# Patient Record
Sex: Male | Born: 1935
Health system: Southern US, Community
[De-identification: ages and names within clinical notes are randomized; demographics above are authoritative.]

## PROBLEM LIST (undated history)

## (undated) DIAGNOSIS — N400 Enlarged prostate without lower urinary tract symptoms: Secondary | ICD-10-CM

## (undated) DIAGNOSIS — M75102 Unspecified rotator cuff tear or rupture of left shoulder, not specified as traumatic: Secondary | ICD-10-CM

## (undated) DIAGNOSIS — M1990 Unspecified osteoarthritis, unspecified site: Secondary | ICD-10-CM

## (undated) DIAGNOSIS — R972 Elevated prostate specific antigen [PSA]: Secondary | ICD-10-CM

## (undated) DIAGNOSIS — E785 Hyperlipidemia, unspecified: Secondary | ICD-10-CM

## (undated) DIAGNOSIS — N529 Male erectile dysfunction, unspecified: Secondary | ICD-10-CM

## (undated) DIAGNOSIS — R351 Nocturia: Secondary | ICD-10-CM

## (undated) DIAGNOSIS — K4091 Unilateral inguinal hernia, without obstruction or gangrene, recurrent: Secondary | ICD-10-CM

## (undated) HISTORY — PX: KNEE ARTHROSCOPY: SUR90

## (undated) HISTORY — DX: Unilateral inguinal hernia, without obstruction or gangrene, recurrent: K40.91

## (undated) HISTORY — DX: Elevated prostate specific antigen (PSA): R97.20

## (undated) HISTORY — DX: Male erectile dysfunction, unspecified: N52.9

## (undated) HISTORY — PX: EYE SURGERY: SHX253

## (undated) HISTORY — DX: Hyperlipidemia, unspecified: E78.5

## (undated) HISTORY — DX: Benign prostatic hyperplasia without lower urinary tract symptoms: N40.0

## (undated) HISTORY — DX: Gilbert syndrome: E80.4

## (undated) HISTORY — PX: JOINT REPLACEMENT: SHX530

## (undated) HISTORY — PX: TONSILLECTOMY: SUR1361

---

## 1985-04-25 HISTORY — PX: OTHER SURGICAL HISTORY: SHX169

## 1997-11-06 ENCOUNTER — Other Ambulatory Visit: Admission: RE | Admit: 1997-11-06 | Discharge: 1997-11-06 | Payer: Self-pay | Admitting: Urology

## 1999-04-08 ENCOUNTER — Other Ambulatory Visit: Admission: RE | Admit: 1999-04-08 | Discharge: 1999-04-08 | Payer: Self-pay | Admitting: Urology

## 2001-12-27 ENCOUNTER — Ambulatory Visit (HOSPITAL_BASED_OUTPATIENT_CLINIC_OR_DEPARTMENT_OTHER): Admission: RE | Admit: 2001-12-27 | Discharge: 2001-12-27 | Payer: Self-pay | Admitting: Urology

## 2001-12-27 ENCOUNTER — Encounter (INDEPENDENT_AMBULATORY_CARE_PROVIDER_SITE_OTHER): Payer: Self-pay | Admitting: Specialist

## 2004-07-13 ENCOUNTER — Ambulatory Visit (HOSPITAL_COMMUNITY): Admission: RE | Admit: 2004-07-13 | Discharge: 2004-07-13 | Payer: Self-pay | Admitting: Gastroenterology

## 2005-01-06 ENCOUNTER — Other Ambulatory Visit: Admission: RE | Admit: 2005-01-06 | Discharge: 2005-01-06 | Payer: Self-pay | Admitting: Dermatology

## 2005-03-03 ENCOUNTER — Other Ambulatory Visit: Admission: RE | Admit: 2005-03-03 | Discharge: 2005-03-03 | Payer: Self-pay | Admitting: Dermatology

## 2006-07-06 ENCOUNTER — Ambulatory Visit (HOSPITAL_BASED_OUTPATIENT_CLINIC_OR_DEPARTMENT_OTHER): Admission: RE | Admit: 2006-07-06 | Discharge: 2006-07-06 | Payer: Self-pay | Admitting: Orthopedic Surgery

## 2009-02-17 ENCOUNTER — Encounter: Admission: RE | Admit: 2009-02-17 | Discharge: 2009-02-17 | Payer: Self-pay | Admitting: Family Medicine

## 2009-12-24 LAB — FECAL OCCULT BLOOD, GUAIAC: Fecal Occult Blood: NEGATIVE

## 2010-07-02 ENCOUNTER — Other Ambulatory Visit: Payer: Self-pay | Admitting: Family Medicine

## 2010-07-02 DIAGNOSIS — R7989 Other specified abnormal findings of blood chemistry: Secondary | ICD-10-CM

## 2010-07-06 ENCOUNTER — Other Ambulatory Visit: Payer: Self-pay | Admitting: Family Medicine

## 2010-07-06 ENCOUNTER — Encounter (HOSPITAL_COMMUNITY): Payer: Self-pay

## 2010-07-06 ENCOUNTER — Ambulatory Visit (HOSPITAL_COMMUNITY)
Admission: RE | Admit: 2010-07-06 | Discharge: 2010-07-06 | Disposition: A | Discharge status: Home / Self Care | Payer: Medicare Other | Source: Ambulatory Visit | Attending: Family Medicine | Admitting: Family Medicine

## 2010-07-06 DIAGNOSIS — R7402 Elevation of levels of lactic acid dehydrogenase (LDH): Secondary | ICD-10-CM | POA: Insufficient documentation

## 2010-07-06 DIAGNOSIS — R7989 Other specified abnormal findings of blood chemistry: Secondary | ICD-10-CM

## 2010-07-06 DIAGNOSIS — N281 Cyst of kidney, acquired: Secondary | ICD-10-CM | POA: Insufficient documentation

## 2010-07-06 DIAGNOSIS — R7401 Elevation of levels of liver transaminase levels: Secondary | ICD-10-CM | POA: Insufficient documentation

## 2010-07-06 MED ORDER — IOHEXOL 300 MG/ML  SOLN
100.0000 mL | Freq: Once | INTRAMUSCULAR | Status: AC | PRN
  Administered 2010-07-06: 100 mL via INTRAVENOUS

## 2010-07-16 ENCOUNTER — Encounter: Payer: Self-pay | Admitting: Family Medicine

## 2010-07-16 DIAGNOSIS — M159 Polyosteoarthritis, unspecified: Secondary | ICD-10-CM | POA: Insufficient documentation

## 2010-07-16 DIAGNOSIS — N529 Male erectile dysfunction, unspecified: Secondary | ICD-10-CM | POA: Insufficient documentation

## 2010-07-16 DIAGNOSIS — E785 Hyperlipidemia, unspecified: Secondary | ICD-10-CM | POA: Insufficient documentation

## 2010-07-16 DIAGNOSIS — R972 Elevated prostate specific antigen [PSA]: Secondary | ICD-10-CM | POA: Insufficient documentation

## 2010-07-16 DIAGNOSIS — K4091 Unilateral inguinal hernia, without obstruction or gangrene, recurrent: Secondary | ICD-10-CM | POA: Insufficient documentation

## 2010-08-30 ENCOUNTER — Encounter: Payer: Self-pay | Admitting: Family Medicine

## 2010-09-10 NOTE — Op Note (Signed)
NAMESHAWNMICHAEL, PARENTEAU                 ACCOUNT NO.:  [id number]   MEDICAL RECORD NO.:  [id number]          PATIENT TYPE:  AMB   LOCATION:  DSC                          FACILITY:  [facility]   PHYSICIAN:  Katy Fitch. Sypher, M.D. 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   OPERATIONS:  1. Debridement of right small finger distal interphalangeal joint.  2. Removal of marginal osteophytes from base of distal phalanx and the      dorsal ulnar aspect of middle phalangeal head.  3. Debridement of mucoid cyst of dorsal nail fold.   OPERATIONS:  Katy Fitch. Sypher, M.D.   ASSISTANT:  No assistant.   ANESTHESIA:  Lidocaine 2% metacarpal head-level block, right small  finger -- supplemented by IV sedation.   SUPERVISING ANESTHESIOLOGIST:  Janetta Hora. Gelene Mink, M.D.   INDICATIONS:  The patient is a 77 year old gentleman who is well  acquainted with our practice, due to prior care for a traumatic tendon  injury.  He is referred by Dr. Christell Constant of [place] for  evaluation and management of a chronic right small finger nail  deformity.   He developed a mucoid cyst more than a year ago and saw Dr. Margo Aye his  dermatologist.  Dr. Margo Aye performed local debridement of the cyst without  success.  He subsequently suggested that the patient follow up with a  hand surgeon for DIP joint debridement.   On exam the patient had a full-length nail groove that was  approximately  40% of the dorsal ulnar nail fold.  He had a mucoid cyst presenting at  the dorsal nail fold and significant Heberdine Bouchard node formation,  consistent with advanced osteoarthritis.   Plain x-rays of his finger documented marginal osteophyte formation  without obvious loose bodies.   The patient requested that we intervene to try to remedy his cyst.   Preoperatively he was advised that due to the prior debridement of the  dorsal nail fold, we could not guarantee that his nail plate would  return to normal.   It is our experienced that after there has been local manipulation by  other physicians of the dermis and subcutaneous tissues in the region of  the nail fold, that often the germinal nail matrix has chronic scarring.  We will proceed with a best effort at this time to resolve this  predicament.   The patient understands we could not affect his underlying  osteoarthritis.   PROCEDURE:  The patient  was brought to the operating room and placed in  supine position on the operating room table.  Following the induction of light sedation, the right hand was prepped  with Betadine and a 2% lidocaine metacarpal head-level block was placed;  with a total of 4 mL of 2% lidocaine.  The right arm was then prepped  with Betadine soapy solution and sterilely draped.  The right small  finger was exsanguinated with gauze wrap, and 1/4-inch Penrose drain was  placed at the base of the finger as a digital tourniquet.   When anesthesia was assured, the procedure commenced with a hockey-stick  type incision extending over the dorsal ulnar aspect of the DIP joint.  This was carried across the dorsal nail fold, unroofing the cyst.  The  cyst was circumferentially dissected, followed back to a sinus tract  exiting from the dorsal ulnar aspect of the joint.  All of the mucinous  contents were debrided, followed by careful use of a curet underneath  the skin to remove any  remaining mucin and seek additional sites of  leakage.  The joint was manipulated in flexion and extension, and a leak  from the dorsal ulnar aspect of the joint confirmed.  There was a large  osteophyte poking through the capsule on the dorsal ulnar aspect of the  joint.  The joint was opened with a capsulotomy between the ulnar  collateral ligament and the distal extensor tendon slip.  A fine rongeur  and a microcuret was used to debride the marginal osteophytes on the  base of the proximal phalanx, deep to the extensor tendon and on the  ulnar corner of the middle phalangeal head.   The joint was then meticulously irrigated with a dental needle and 40 mL  of sterile saline.  The wound was then repaired with simple suture of 5-  0 nylon.  A compressive dressing was applied with Xeroflo sterile gauze  and Coban.  For aftercare the patient was advised to elevate his hand  above his heart for 24 hours.  He will avoid getting his bandage wet for  1 week, until he returns to the office for dressing change and possible  suture removal.   He is provided prescriptions for doxycycline 100 mg p.o. b.i.d. x5 days,  and Percocet 5 mg one p.o. q.4-6 h p.r.n. pain (20 tablets without  refill).      Katy Fitch Sypher, M.D.  Electronically Signed     RVS/MEDQ  D:  07/06/2006  T:  07/07/2006  Job:  [id number]   cc:   Ernestina Penna, M.D.

## 2010-09-10 NOTE — Op Note (Signed)
Cameron [name], Cameron [name]                 ACCOUNT NO.:  [id number]   MEDICAL RECORD NO.:  [id number]          PATIENT TYPE:  AMB   LOCATION:  ENDO                         FACILITY:  [facility]   PHYSICIAN:  John C. Madilyn Fireman, M.D.    DATE OF BIRTH:  1933-03-21   DATE OF PROCEDURE:  07/13/2004  DATE OF DISCHARGE:                                 OPERATIVE REPORT   PROCEDURE:  Colonoscopy.   INDICATIONS FOR PROCEDURE:  Average risk colon cancer screening.   DESCRIPTION OF PROCEDURE:  The patient was placed in the left lateral  decubitus position then placed on the pulse monitor with continuous low flow  oxygen delivered by nasal cannula. He was sedated with 50 mcg IV fentanyl  and 5 mg IV Versed. The Olympus video colonoscope was inserted into the  rectum and advanced to the cecum, confirmed by transillumination at  McBurney's point and visualization of the ileocecal valve and appendiceal  orifice. The prep was excellent. The cecum, ascending, transverse,  descending and sigmoid colon all appeared normal with no masses, polyps,  diverticula or other mucosal abnormalities. The rectum likewise appeared  normal and retroflexed view of the anus revealed no obvious internal  hemorrhoids. The scope was then withdrawn and the patient returned to the  recovery room in stable condition. He tolerated the procedure well and there  were no immediate complications.   IMPRESSION:  Normal colonoscopy.   PLAN:  Next colonoscopy within 10 years and consider flexible sigmoidoscopy  or Hemoccult's in five years.      JCH/MEDQ  D:  07/13/2004  T:  07/13/2004  Job:  [id number]   cc:   Ernestina Penna, M.D.  [address] [place]  [place] [address]  Fax: [phone]

## 2010-09-10 NOTE — Op Note (Signed)
   Cameron [name], LUCCHESI                    ACCOUNT NO.:  [id number]   MEDICAL RECORD NO.:  [id number]                   PATIENT TYPE:  AMB   LOCATION:  [facility]                                 FACILITY:  [facility]   PHYSICIAN:  Sigmund I. Patsi Sears, M.D.         DATE OF BIRTH:  03/03/1933   DATE OF PROCEDURE:  DATE OF DISCHARGE:                                 OPERATIVE REPORT   PREOPERATIVE DIAGNOSES:  Abnormal PSA with history of negative biopsies.   POSTOPERATIVE DIAGNOSES:  Abnormal PSA with history of negative biopsies.   OPERATION:  Transperineal needle biopsy of the prostate.   RADIATION THERAPIST:  Maryln Gottron, M.D.   PREPARATION:  After appropriate preanesthesia, the patient was brought to  the operating room, placed on the operating table in the dorsal supine  position where general LMA anesthesia was introduced. He was then replaced  in dorsal lithotomy position where the pubis was prepped with Betadine  solution and draped in the usual fashion.   REVIEW OF HISTORY:  This 77 year old male, has a history of 101 cc gland,  prostate __________ symptoms of 23/7, quality of life index of 3/6, on  Flomax and Proscar for the last six months. The patient has a history of  elevated PSAs in the past with multiple negative biopsies. PSAs have ranged  from 6.1 in 1999 to 7.5 in 2000. His highest PSA is 9.2. The PSA-2 ranging  from 23% to 14%. His current PSA is 3.42 (6.84 on Proscar) with a PSA-2 of  14.62%. He is now for multiple transperineal needle biopsies.   DESCRIPTION OF PROCEDURE:  The prostate is ultrasounded and multiple cords  of tissue were taken prostate mapping (see pathology notes). The patient  tolerated the procedure well, and a Foley catheter was placed at the end of  the biopsies. He was awakened and taken to the recovery room in good  condition.                                                Sigmund I. Patsi Sears, M.D.    SIT/MEDQ  D:  12/27/2001   T:  12/27/2001  Job:  [id number]

## 2011-05-05 DIAGNOSIS — R972 Elevated prostate specific antigen [PSA]: Secondary | ICD-10-CM | POA: Diagnosis not present

## 2011-05-05 DIAGNOSIS — E785 Hyperlipidemia, unspecified: Secondary | ICD-10-CM | POA: Diagnosis not present

## 2011-07-26 DIAGNOSIS — E785 Hyperlipidemia, unspecified: Secondary | ICD-10-CM | POA: Diagnosis not present

## 2011-07-26 DIAGNOSIS — I10 Essential (primary) hypertension: Secondary | ICD-10-CM | POA: Diagnosis not present

## 2011-07-26 DIAGNOSIS — E559 Vitamin D deficiency, unspecified: Secondary | ICD-10-CM | POA: Diagnosis not present

## 2011-08-30 DIAGNOSIS — M79609 Pain in unspecified limb: Secondary | ICD-10-CM | POA: Diagnosis not present

## 2011-08-30 DIAGNOSIS — N400 Enlarged prostate without lower urinary tract symptoms: Secondary | ICD-10-CM | POA: Diagnosis not present

## 2011-08-30 DIAGNOSIS — E785 Hyperlipidemia, unspecified: Secondary | ICD-10-CM | POA: Diagnosis not present

## 2011-08-31 DIAGNOSIS — Z1212 Encounter for screening for malignant neoplasm of rectum: Secondary | ICD-10-CM | POA: Diagnosis not present

## 2011-09-26 DIAGNOSIS — G5600 Carpal tunnel syndrome, unspecified upper limb: Secondary | ICD-10-CM | POA: Diagnosis not present

## 2011-09-26 DIAGNOSIS — M19049 Primary osteoarthritis, unspecified hand: Secondary | ICD-10-CM | POA: Diagnosis not present

## 2011-12-01 DIAGNOSIS — E559 Vitamin D deficiency, unspecified: Secondary | ICD-10-CM | POA: Diagnosis not present

## 2011-12-01 DIAGNOSIS — E785 Hyperlipidemia, unspecified: Secondary | ICD-10-CM | POA: Diagnosis not present

## 2011-12-01 DIAGNOSIS — I10 Essential (primary) hypertension: Secondary | ICD-10-CM | POA: Diagnosis not present

## 2011-12-01 DIAGNOSIS — R5381 Other malaise: Secondary | ICD-10-CM | POA: Diagnosis not present

## 2011-12-01 DIAGNOSIS — R5383 Other fatigue: Secondary | ICD-10-CM | POA: Diagnosis not present

## 2011-12-29 DIAGNOSIS — E785 Hyperlipidemia, unspecified: Secondary | ICD-10-CM | POA: Diagnosis not present

## 2011-12-29 DIAGNOSIS — M25569 Pain in unspecified knee: Secondary | ICD-10-CM | POA: Diagnosis not present

## 2011-12-29 DIAGNOSIS — N400 Enlarged prostate without lower urinary tract symptoms: Secondary | ICD-10-CM | POA: Diagnosis not present

## 2012-01-10 DIAGNOSIS — M1710 Unilateral primary osteoarthritis, unspecified knee: Secondary | ICD-10-CM | POA: Diagnosis not present

## 2012-01-10 DIAGNOSIS — IMO0002 Reserved for concepts with insufficient information to code with codable children: Secondary | ICD-10-CM | POA: Diagnosis not present

## 2012-01-30 DIAGNOSIS — Z23 Encounter for immunization: Secondary | ICD-10-CM | POA: Diagnosis not present

## 2012-01-31 DIAGNOSIS — IMO0002 Reserved for concepts with insufficient information to code with codable children: Secondary | ICD-10-CM | POA: Diagnosis not present

## 2012-01-31 DIAGNOSIS — M1710 Unilateral primary osteoarthritis, unspecified knee: Secondary | ICD-10-CM | POA: Diagnosis not present

## 2012-03-06 DIAGNOSIS — N138 Other obstructive and reflux uropathy: Secondary | ICD-10-CM | POA: Diagnosis not present

## 2012-03-06 DIAGNOSIS — N401 Enlarged prostate with lower urinary tract symptoms: Secondary | ICD-10-CM | POA: Diagnosis not present

## 2012-03-06 DIAGNOSIS — R972 Elevated prostate specific antigen [PSA]: Secondary | ICD-10-CM | POA: Diagnosis not present

## 2012-03-13 DIAGNOSIS — R351 Nocturia: Secondary | ICD-10-CM | POA: Diagnosis not present

## 2012-03-13 DIAGNOSIS — R972 Elevated prostate specific antigen [PSA]: Secondary | ICD-10-CM | POA: Diagnosis not present

## 2012-03-13 DIAGNOSIS — N138 Other obstructive and reflux uropathy: Secondary | ICD-10-CM | POA: Diagnosis not present

## 2012-03-13 DIAGNOSIS — N529 Male erectile dysfunction, unspecified: Secondary | ICD-10-CM | POA: Diagnosis not present

## 2012-04-03 DIAGNOSIS — E785 Hyperlipidemia, unspecified: Secondary | ICD-10-CM | POA: Diagnosis not present

## 2012-04-03 DIAGNOSIS — N400 Enlarged prostate without lower urinary tract symptoms: Secondary | ICD-10-CM | POA: Diagnosis not present

## 2012-04-04 DIAGNOSIS — R5381 Other malaise: Secondary | ICD-10-CM | POA: Diagnosis not present

## 2012-04-04 DIAGNOSIS — R5383 Other fatigue: Secondary | ICD-10-CM | POA: Diagnosis not present

## 2012-04-04 DIAGNOSIS — Z125 Encounter for screening for malignant neoplasm of prostate: Secondary | ICD-10-CM | POA: Diagnosis not present

## 2012-04-04 DIAGNOSIS — I10 Essential (primary) hypertension: Secondary | ICD-10-CM | POA: Diagnosis not present

## 2012-04-04 DIAGNOSIS — E559 Vitamin D deficiency, unspecified: Secondary | ICD-10-CM | POA: Diagnosis not present

## 2012-04-04 DIAGNOSIS — E785 Hyperlipidemia, unspecified: Secondary | ICD-10-CM | POA: Diagnosis not present

## 2012-05-04 ENCOUNTER — Encounter (HOSPITAL_COMMUNITY): Payer: Self-pay | Admitting: Pharmacy Technician

## 2012-05-08 ENCOUNTER — Encounter (HOSPITAL_COMMUNITY): Payer: Self-pay

## 2012-05-08 ENCOUNTER — Encounter (HOSPITAL_COMMUNITY)
Admission: RE | Admit: 2012-05-08 | Discharge: 2012-05-08 | Disposition: A | Discharge status: Home / Self Care | Payer: Medicare Other | Source: Ambulatory Visit | Attending: Orthopedic Surgery | Admitting: Orthopedic Surgery

## 2012-05-08 ENCOUNTER — Ambulatory Visit (HOSPITAL_COMMUNITY)
Admission: RE | Admit: 2012-05-08 | Discharge: 2012-05-08 | Disposition: A | Discharge status: Home / Self Care | Payer: Medicare Other | Source: Ambulatory Visit | Attending: Surgical | Admitting: Surgical

## 2012-05-08 ENCOUNTER — Other Ambulatory Visit (HOSPITAL_COMMUNITY): Payer: Self-pay | Admitting: *Deleted

## 2012-05-08 DIAGNOSIS — Z01812 Encounter for preprocedural laboratory examination: Secondary | ICD-10-CM | POA: Insufficient documentation

## 2012-05-08 DIAGNOSIS — Z01818 Encounter for other preprocedural examination: Secondary | ICD-10-CM | POA: Diagnosis not present

## 2012-05-08 HISTORY — DX: Unspecified osteoarthritis, unspecified site: M19.90

## 2012-05-08 LAB — URINALYSIS, ROUTINE W REFLEX MICROSCOPIC
Bilirubin Urine: NEGATIVE
Glucose, UA: NEGATIVE mg/dL
Hgb urine dipstick: NEGATIVE
Leukocytes, UA: NEGATIVE
Nitrite: NEGATIVE
Protein, ur: NEGATIVE mg/dL
Specific Gravity, Urine: 1.025 (ref 1.005–1.030)
Urobilinogen, UA: 1 mg/dL (ref 0.0–1.0)
pH: 6.5 (ref 5.0–8.0)

## 2012-05-08 LAB — CBC
HCT: 48.8 % (ref 39.0–52.0)
MCH: 31.3 pg (ref 26.0–34.0)
RBC: 5.36 MIL/uL (ref 4.22–5.81)
WBC: 7.6 10*3/uL (ref 4.0–10.5)

## 2012-05-08 LAB — COMPREHENSIVE METABOLIC PANEL
ALT: 15 U/L (ref 0–53)
AST: 15 U/L (ref 0–37)
Albumin: 4.1 g/dL (ref 3.5–5.2)
Alkaline Phosphatase: 71 U/L (ref 39–117)
BUN: 20 mg/dL (ref 6–23)
CO2: 30 mEq/L (ref 19–32)
Calcium: 10 mg/dL (ref 8.4–10.5)
Chloride: 99 mEq/L (ref 96–112)
Creatinine, Ser: 0.87 mg/dL (ref 0.50–1.35)
GFR calc Af Amer: >90 mL/min (ref >90)
GFR calc non Af Amer: 81 mL/min — ABNORMAL LOW (ref >90)
Glucose, Bld: 91 mg/dL (ref 70–99)
Potassium: 4.3 mEq/L (ref 3.5–5.1)
Sodium: 137 mEq/L (ref 135–145)
Total Bilirubin: 1.4 mg/dL — ABNORMAL HIGH (ref 0.3–1.2)
Total Protein: 7 g/dL (ref 6.0–8.3)

## 2012-05-08 LAB — PROTIME-INR
INR: 1.03 (ref 0.00–1.49)
Prothrombin Time: 13.4 seconds (ref 11.6–15.2)

## 2012-05-08 LAB — APTT: aPTT: 28 seconds (ref 24–37)

## 2012-05-08 LAB — SURGICAL PCR SCREEN: Staphylococcus aureus: NEGATIVE

## 2012-05-08 NOTE — Patient Instructions (Addendum)
20      Your procedure is scheduled on: Wednesday 05/16/2012   Report to [facility] at 0630 AM.  Call this number if you have problems the morning of surgery: [phone]   Remember:             IF YOU USE CPAP,BRING MASK AND TUBING AM OF SURGERY!   Do not eat food or drink liquids AFTER MIDNIGHT!  Take these medicines the morning of surgery with A SIP OF WATER: NONE   Do not bring valuables to the hospital.  .  Leave suitcase in the car. After surgery it may be brought to your room.  For patients admitted to the hospital, checkout time is 11:00 AM the day of              Discharge.    DO NOT WEAR JEWELRY , MAKE-UP, LOTIONS,POWDERS,PERFUMES!             WOMEN -DO NOT SHAVE LEGS OR UNDERARMS 12 HRS. BEFORE SURGERY!               MEN MAY SHAVE AS USUAL!             CONTACTS,DENTURES OR BRIDGEWORK, FALSE EYELASHES MAY NOT BE WORN INTO SURGERY!                                           Patients discharged the day of surgery will not be allowed to drive home.If going home the same day of surgery, must have someone stay with youfirst 24 hrs.at home and arrange for someone to drive you home from the              Hospital.              YOUR DRIVER ZO:[id number]   Special Instructions:             Please read over the following fact sheets that you were given:             1. [place] PREPARING FOR SURGERY SHEET              2.MRSA INFORMATION              3.[facility]                                        Telford Nab.Hermen Mario,RN,BSN     [id number]                FAILURE TO FOLLOW THESE INSTRUCTIONS MAY RESULT IN CANCELLATION OF YOUR SURGERY!               Patient Signature:___________________________

## 2012-05-08 NOTE — Progress Notes (Signed)
Surgical clearance from [facility] -Dr. Rudi Heap 04/03/2012 on chart, and Treadmill EKG study from [facility] 10/06/2010 on chart.

## 2012-05-09 NOTE — H&P (Signed)
TOTAL KNEE ADMISSION H&P  Patient is being admitted for left total knee arthroplasty.  Subjective:  Chief Complaint:left knee pain.  HPI: Cameron [name], 79 y.o. male, has a history of pain and functional disability in the left knee due to arthritis and has failed non-surgical conservative treatments for greater than 12 weeks to includeNSAID's and/or analgesics, corticosteriod injections, flexibility and strengthening excercises and activity modification.  Onset of symptoms was gradual, starting 1 years ago with gradually worsening course since that time. The patient noted prior procedures on the knee to include  arthroscopy on the left knee(s).  Patient currently rates pain in the left knee(s) at 7 out of 10 with activity. Patient has night pain, worsening of pain with activity and weight bearing, pain that interferes with activities of daily living, pain with passive range of motion, crepitus and joint swelling.  Patient has evidence of subchondral sclerosis, periarticular osteophytes and joint space narrowing by imaging studies. There is no active infection.  Patient Active Problem List   Diagnosis Date Noted  . Gilberts syndrome   . Inguinal hernia recurrent unilateral   . Elevated PSA   . Erectile dysfunction   . Other and unspecified hyperlipidemia   . Osteoarthrosis and allied disorders    Past Medical History  Diagnosis Date  . BPH (benign prostatic hyperplasia)   . Osteoarthrosis and allied disorders   . Erectile dysfunction   . Other and unspecified hyperlipidemia   . Elevated PSA   . Inguinal hernia recurrent unilateral   . Gilberts syndrome   . Arthritis     Past Surgical History  Procedure Date  . Prostate surgery 2004  . Tonsillectomy     as child  . Right hand   . Knee arthroscopy     bilateral     Current outpatient prescriptions: aspirin 81 MG EC tablet, Take 81 mg by mouth at bedtime. , Disp: , Rfl: ;   cholecalciferol (VITAMIN D) 1000 UNITS tablet, Take  1,000 Units by mouth 2 (two) times daily., Disp: , Rfl: ;   Coenzyme Q10 200 MG capsule, Take 200 mg by mouth every other day., Disp: , Rfl: ;   Flaxseed, Linseed, (FLAX SEED OIL) 1000 MG CAPS, Take 1,000 mg by mouth daily., Disp: , Rfl:  GLUCOSAMINE-CHONDROIT-MSM-C-MN PO, Take 1 tablet by mouth 2 (two) times daily., Disp: , Rfl: ibuprofen (ADVIL,MOTRIN) 200 MG tablet, Take 400 mg by mouth every 8 (eight) hours as needed. Pain, Disp: , Rfl: ;   simvastatin (ZOCOR) 40 MG tablet, Take 20-40 mg by mouth at bedtime. 1/2 alternating with 1/2 q o d, Disp: , Rfl: ;   Tamsulosin HCl (FLOMAX) 0.4 MG CAPS, Take 0.4 mg by mouth daily after supper., Disp: , Rfl:    Allergies  Allergen Reactions  . Bee Venom Swelling    History  Substance Use Topics  . Smoking status: Former Games developer  . Smokeless tobacco: Not on file  . Alcohol Use: 0.6 oz/week    1 Cans of beer per week     Comment: 1 beer daily    Family History  Problem Relation Age of Onset  . COPD Mother   . Heart disease Father   . Heart attack Father   . Cancer Maternal Grandfather   . Heart attack Paternal Grandfather   . Hypertension Sister   . Hypertension Sister      Review of Systems  Constitutional: Negative.   HENT: Negative.  Negative for neck pain.   Eyes: Negative.  Respiratory: Negative.   Cardiovascular: Negative.   Gastrointestinal: Negative.   Genitourinary: Positive for frequency. Negative for dysuria, urgency, hematuria and flank pain.  Musculoskeletal: Positive for back pain and joint pain. Negative for myalgias and falls.       Left knee pain  Skin: Negative.   Neurological: Negative.   Endo/Heme/Allergies: Negative.   Psychiatric/Behavioral: Negative.     Objective:  Physical Exam  Constitutional: He is oriented to person, place, and time. He appears well-developed and well-nourished. No distress.  HENT:  Head: Normocephalic and atraumatic.  Right Ear: External ear normal.  Left Ear: External ear  normal.  Nose: Nose normal.  Mouth/Throat: Oropharynx is clear and moist.  Eyes: Conjunctivae normal and EOM are normal.  Neck: Normal range of motion. Neck supple. No tracheal deviation present. No thyromegaly present.  Cardiovascular: Normal rate, regular rhythm, normal heart sounds and intact distal pulses.   No murmur heard. Respiratory: Effort normal and breath sounds normal. No respiratory distress. He has no wheezes. He exhibits no tenderness.  GI: Soft. Bowel sounds are normal. He exhibits no distension and no mass. There is no tenderness.  Musculoskeletal:       Right hip: Normal.       Left hip: Normal.       Right knee: Normal.       Left knee: He exhibits decreased range of motion and swelling. He exhibits no erythema.       Right lower leg: He exhibits no tenderness and no swelling.       Left lower leg: He exhibits no tenderness and no swelling.       Legs: Lymphadenopathy:    He has no cervical adenopathy.  Neurological: He is alert and oriented to person, place, and time. He has normal strength and normal reflexes. No sensory deficit.  Skin: No rash noted. He is not diaphoretic. No erythema.  Psychiatric: His behavior is normal.    Vital signs in last 24 hours: Temp:  [97.5 F (36.4 C)] 97.5 F (36.4 C) (01/14 1146) Pulse Rate:  [71] 71  (01/14 1146) Resp:  [16] 16  (01/14 1146) BP: (125)/(77) 125/77 mmHg (01/14 1146) SpO2:  [96 %] 96 % (01/14 1146) Weight:  [78.926 kg (174 lb)] 78.926 kg (174 lb) (01/14 1146)   Imaging Review Plain radiographs demonstrate moderate degenerative joint disease of the left knee(s). The overall alignment ismild varus. The bone quality appears to be fair for age and reported activity level.  Assessment/Plan:  End stage arthritis, left knee   The patient history, physical examination, clinical judgment of the provider and imaging studies are consistent with end stage degenerative joint disease of the left knee(s) and total knee  arthroplasty is deemed medically necessary. The treatment options including medical management, injection therapy arthroscopy and arthroplasty were discussed at length. The risks and benefits of total knee arthroplasty were presented and reviewed. The risks due to aseptic loosening, infection, stiffness, patella tracking problems, thromboembolic complications and other imponderables were discussed. The patient acknowledged the explanation, agreed to proceed with the plan and consent was signed. Patient is being admitted for inpatient treatment for surgery, pain control, PT, OT, prophylactic antibiotics, VTE prophylaxis, progressive ambulation and ADL's and discharge planning. The patient is planning to be discharged home with home health services     [place], [place]

## 2012-05-16 ENCOUNTER — Inpatient Hospital Stay (HOSPITAL_COMMUNITY)
Admission: RE | Admit: 2012-05-16 | Discharge: 2012-05-19 | DRG: 470 | Disposition: A | Discharge status: Home Health | Payer: Medicare Other | Source: Ambulatory Visit | Attending: Orthopedic Surgery | Admitting: Orthopedic Surgery

## 2012-05-16 ENCOUNTER — Encounter (HOSPITAL_COMMUNITY): Payer: Self-pay | Admitting: Registered Nurse

## 2012-05-16 ENCOUNTER — Encounter (HOSPITAL_COMMUNITY)
Admission: RE | Disposition: A | Discharge status: Home Health | Payer: Self-pay | Source: Ambulatory Visit | Attending: Orthopedic Surgery

## 2012-05-16 ENCOUNTER — Encounter (HOSPITAL_COMMUNITY): Payer: Self-pay | Admitting: *Deleted

## 2012-05-16 ENCOUNTER — Inpatient Hospital Stay (HOSPITAL_COMMUNITY): Payer: Medicare Other

## 2012-05-16 ENCOUNTER — Inpatient Hospital Stay (HOSPITAL_COMMUNITY): Payer: Medicare Other | Admitting: Registered Nurse

## 2012-05-16 ENCOUNTER — Encounter (HOSPITAL_COMMUNITY): Payer: Self-pay | Admitting: Orthopedic Surgery

## 2012-05-16 DIAGNOSIS — M1710 Unilateral primary osteoarthritis, unspecified knee: Principal | ICD-10-CM | POA: Diagnosis present

## 2012-05-16 DIAGNOSIS — IMO0002 Reserved for concepts with insufficient information to code with codable children: Secondary | ICD-10-CM | POA: Diagnosis not present

## 2012-05-16 DIAGNOSIS — R972 Elevated prostate specific antigen [PSA]: Secondary | ICD-10-CM | POA: Diagnosis present

## 2012-05-16 DIAGNOSIS — N400 Enlarged prostate without lower urinary tract symptoms: Secondary | ICD-10-CM | POA: Diagnosis present

## 2012-05-16 DIAGNOSIS — Z96659 Presence of unspecified artificial knee joint: Secondary | ICD-10-CM | POA: Diagnosis not present

## 2012-05-16 DIAGNOSIS — D62 Acute posthemorrhagic anemia: Secondary | ICD-10-CM | POA: Diagnosis not present

## 2012-05-16 DIAGNOSIS — E785 Hyperlipidemia, unspecified: Secondary | ICD-10-CM | POA: Diagnosis present

## 2012-05-16 DIAGNOSIS — Z79899 Other long term (current) drug therapy: Secondary | ICD-10-CM

## 2012-05-16 DIAGNOSIS — M24569 Contracture, unspecified knee: Secondary | ICD-10-CM | POA: Diagnosis present

## 2012-05-16 DIAGNOSIS — Z471 Aftercare following joint replacement surgery: Secondary | ICD-10-CM | POA: Diagnosis not present

## 2012-05-16 HISTORY — PX: TOTAL KNEE ARTHROPLASTY: SHX125

## 2012-05-16 LAB — ABO/RH: ABO/RH(D): O POS

## 2012-05-16 LAB — TYPE AND SCREEN
ABO/RH(D): O POS
Antibody Screen: NEGATIVE

## 2012-05-16 SURGERY — ARTHROPLASTY, KNEE, TOTAL
Anesthesia: General | Site: Knee | Laterality: Left | Wound class: Clean

## 2012-05-16 MED ORDER — SODIUM CHLORIDE 0.9 % IR SOLN
Status: DC | PRN
  Administered 2012-05-16: 09:00:00

## 2012-05-16 MED ORDER — ACETAMINOPHEN 325 MG PO TABS: 650.0000 mg | ORAL_TABLET | Freq: Four times a day (QID) | ORAL | Status: DC | PRN

## 2012-05-16 MED ORDER — HYDROCODONE-ACETAMINOPHEN 5-325 MG PO TABS
1.0000 | ORAL_TABLET | ORAL | Status: DC | PRN
  Administered 2012-05-16 (×2): 2 via ORAL
  Administered 2012-05-17 (×3): 1 via ORAL
  Administered 2012-05-17: 2 via ORAL
  Administered 2012-05-18 (×5): 1 via ORAL
  Administered 2012-05-19: 2 via ORAL
  Filled 2012-05-16 (×2): qty 1
  Filled 2012-05-16 (×3): qty 2
  Filled 2012-05-16 (×2): qty 1
  Filled 2012-05-16 (×2): qty 2
  Filled 2012-05-16 (×3): qty 1

## 2012-05-16 MED ORDER — HEMOSTATIC AGENTS (NO CHARGE) OPTIME
TOPICAL | Status: DC | PRN
  Administered 2012-05-16: 1 via TOPICAL

## 2012-05-16 MED ORDER — SIMVASTATIN 40 MG PO TABS
40.0000 mg | ORAL_TABLET | ORAL | Status: DC
  Filled 2012-05-16 (×2): qty 1

## 2012-05-16 MED ORDER — CEFAZOLIN SODIUM 1-5 GM-% IV SOLN
1.0000 g | Freq: Four times a day (QID) | INTRAVENOUS | Status: AC
  Administered 2012-05-16 (×2): 1 g via INTRAVENOUS
  Filled 2012-05-16 (×2): qty 50

## 2012-05-16 MED ORDER — CEFAZOLIN SODIUM-DEXTROSE 2-3 GM-% IV SOLR
2.0000 g | INTRAVENOUS | Status: AC
  Administered 2012-05-16: 2 g via INTRAVENOUS

## 2012-05-16 MED ORDER — SIMVASTATIN 20 MG PO TABS
20.0000 mg | ORAL_TABLET | Freq: Every day | ORAL | Status: DC
  Filled 2012-05-16: qty 1

## 2012-05-16 MED ORDER — PROMETHAZINE HCL 25 MG/ML IJ SOLN: 6.2500 mg | INTRAMUSCULAR | Status: DC | PRN

## 2012-05-16 MED ORDER — PROMETHAZINE HCL 25 MG/ML IJ SOLN
INTRAMUSCULAR | Status: AC
  Filled 2012-05-16: qty 1

## 2012-05-16 MED ORDER — METHOCARBAMOL 100 MG/ML IJ SOLN
500.0000 mg | Freq: Four times a day (QID) | INTRAVENOUS | Status: DC | PRN
  Administered 2012-05-16: 500 mg via INTRAVENOUS
  Filled 2012-05-16: qty 5

## 2012-05-16 MED ORDER — MENTHOL 3 MG MT LOZG: 1.0000 | LOZENGE | OROMUCOSAL | Status: DC | PRN

## 2012-05-16 MED ORDER — GLYCOPYRROLATE 0.2 MG/ML IJ SOLN
INTRAMUSCULAR | Status: DC | PRN
  Administered 2012-05-16: .6 mg via INTRAVENOUS

## 2012-05-16 MED ORDER — CELECOXIB 200 MG PO CAPS
200.0000 mg | ORAL_CAPSULE | Freq: Two times a day (BID) | ORAL | Status: DC
  Administered 2012-05-16 – 2012-05-19 (×7): 200 mg via ORAL
  Filled 2012-05-16 (×9): qty 1

## 2012-05-16 MED ORDER — ALUM & MAG HYDROXIDE-SIMETH 200-200-20 MG/5ML PO SUSP: 30.0000 mL | ORAL | Status: DC | PRN

## 2012-05-16 MED ORDER — SODIUM CHLORIDE 0.9 % IJ SOLN
INTRAMUSCULAR | Status: DC | PRN
  Administered 2012-05-16: 20 mL

## 2012-05-16 MED ORDER — HYDROMORPHONE HCL PF 1 MG/ML IJ SOLN
0.2500 mg | INTRAMUSCULAR | Status: DC | PRN
  Administered 2012-05-16 (×2): 0.5 mg via INTRAVENOUS

## 2012-05-16 MED ORDER — FENTANYL CITRATE 0.05 MG/ML IJ SOLN
INTRAMUSCULAR | Status: DC | PRN
  Administered 2012-05-16 (×5): 50 ug via INTRAVENOUS

## 2012-05-16 MED ORDER — PHENOL 1.4 % MT LIQD: 1.0000 | OROMUCOSAL | Status: DC | PRN

## 2012-05-16 MED ORDER — HYDROMORPHONE HCL PF 1 MG/ML IJ SOLN: 1.0000 mg | INTRAMUSCULAR | Status: DC | PRN

## 2012-05-16 MED ORDER — MEPERIDINE HCL 50 MG/ML IJ SOLN: 6.2500 mg | INTRAMUSCULAR | Status: DC | PRN

## 2012-05-16 MED ORDER — NEOSTIGMINE METHYLSULFATE 1 MG/ML IJ SOLN
INTRAMUSCULAR | Status: DC | PRN
  Administered 2012-05-16: 4 mg via INTRAVENOUS

## 2012-05-16 MED ORDER — LACTATED RINGERS IV SOLN: INTRAVENOUS | Status: DC

## 2012-05-16 MED ORDER — ACETAMINOPHEN 10 MG/ML IV SOLN: INTRAVENOUS | Status: DC | PRN

## 2012-05-16 MED ORDER — HYDROMORPHONE HCL PF 1 MG/ML IJ SOLN
INTRAMUSCULAR | Status: AC
  Filled 2012-05-16: qty 1

## 2012-05-16 MED ORDER — ROCURONIUM BROMIDE 100 MG/10ML IV SOLN
INTRAVENOUS | Status: DC | PRN
  Administered 2012-05-16: 50 mg via INTRAVENOUS

## 2012-05-16 MED ORDER — DEXAMETHASONE SODIUM PHOSPHATE 10 MG/ML IJ SOLN
INTRAMUSCULAR | Status: DC | PRN
  Administered 2012-05-16: 10 mg via INTRAVENOUS

## 2012-05-16 MED ORDER — THROMBIN 5000 UNITS EX SOLR
CUTANEOUS | Status: DC | PRN
  Administered 2012-05-16: 5000 [IU] via TOPICAL

## 2012-05-16 MED ORDER — FERROUS SULFATE 325 (65 FE) MG PO TABS
325.0000 mg | ORAL_TABLET | Freq: Three times a day (TID) | ORAL | Status: DC
  Administered 2012-05-16 – 2012-05-19 (×8): 325 mg via ORAL
  Filled 2012-05-16 (×12): qty 1

## 2012-05-16 MED ORDER — SODIUM CHLORIDE 0.9 % IR SOLN
Status: DC | PRN
  Administered 2012-05-16: 3000 mL

## 2012-05-16 MED ORDER — LACTATED RINGERS IV SOLN
INTRAVENOUS | Status: DC
  Administered 2012-05-16: 08:00:00 via INTRAVENOUS

## 2012-05-16 MED ORDER — ONDANSETRON HCL 4 MG PO TABS: 4.0000 mg | ORAL_TABLET | Freq: Four times a day (QID) | ORAL | Status: DC | PRN

## 2012-05-16 MED ORDER — PHENYLEPHRINE HCL 10 MG/ML IJ SOLN
INTRAMUSCULAR | Status: DC | PRN
  Administered 2012-05-16: 40 ug via INTRAVENOUS

## 2012-05-16 MED ORDER — LACTATED RINGERS IV SOLN
INTRAVENOUS | Status: DC
  Administered 2012-05-16 (×2): via INTRAVENOUS
  Administered 2012-05-17: 1000 mL via INTRAVENOUS

## 2012-05-16 MED ORDER — TAMSULOSIN HCL 0.4 MG PO CAPS
0.4000 mg | ORAL_CAPSULE | Freq: Every day | ORAL | Status: DC
  Administered 2012-05-16 – 2012-05-18 (×3): 0.4 mg via ORAL
  Filled 2012-05-16 (×4): qty 1

## 2012-05-16 MED ORDER — METHOCARBAMOL 500 MG PO TABS
500.0000 mg | ORAL_TABLET | Freq: Four times a day (QID) | ORAL | Status: DC | PRN
  Administered 2012-05-16 – 2012-05-19 (×4): 500 mg via ORAL
  Filled 2012-05-16 (×5): qty 1

## 2012-05-16 MED ORDER — PROPOFOL 10 MG/ML IV BOLUS
INTRAVENOUS | Status: DC | PRN
  Administered 2012-05-16: 150 mg via INTRAVENOUS
  Administered 2012-05-16: 30 mg via INTRAVENOUS

## 2012-05-16 MED ORDER — SIMVASTATIN 20 MG PO TABS
20.0000 mg | ORAL_TABLET | ORAL | Status: DC
  Administered 2012-05-18: 20 mg via ORAL
  Filled 2012-05-16 (×2): qty 1

## 2012-05-16 MED ORDER — OXYCODONE-ACETAMINOPHEN 5-325 MG PO TABS: 1.0000 | ORAL_TABLET | ORAL | Status: DC | PRN

## 2012-05-16 MED ORDER — HYDROMORPHONE HCL PF 1 MG/ML IJ SOLN
INTRAMUSCULAR | Status: DC | PRN
  Administered 2012-05-16: 1 mg via INTRAVENOUS
  Administered 2012-05-16 (×2): 0.5 mg via INTRAVENOUS

## 2012-05-16 MED ORDER — LIDOCAINE HCL (CARDIAC) 20 MG/ML IV SOLN
INTRAVENOUS | Status: DC | PRN
  Administered 2012-05-16: 80 mg via INTRAVENOUS

## 2012-05-16 MED ORDER — RIVAROXABAN 10 MG PO TABS
10.0000 mg | ORAL_TABLET | Freq: Every day | ORAL | Status: DC
  Administered 2012-05-17 – 2012-05-19 (×3): 10 mg via ORAL
  Filled 2012-05-16 (×4): qty 1

## 2012-05-16 MED ORDER — ONDANSETRON HCL 4 MG/2ML IJ SOLN
INTRAMUSCULAR | Status: DC | PRN
  Administered 2012-05-16: 4 mg via INTRAVENOUS

## 2012-05-16 MED ORDER — PROMETHAZINE HCL 25 MG/ML IJ SOLN
12.5000 mg | Freq: Four times a day (QID) | INTRAMUSCULAR | Status: DC | PRN
  Administered 2012-05-16: 12.5 mg via INTRAVENOUS
  Filled 2012-05-16: qty 1

## 2012-05-16 MED ORDER — BUPIVACAINE LIPOSOME 1.3 % IJ SUSP
20.0000 mL | INTRAMUSCULAR | Status: AC
  Administered 2012-05-16: 20 mL
  Filled 2012-05-16: qty 20

## 2012-05-16 MED ORDER — ONDANSETRON HCL 4 MG/2ML IJ SOLN: 4.0000 mg | Freq: Four times a day (QID) | INTRAMUSCULAR | Status: DC | PRN

## 2012-05-16 MED ORDER — ACETAMINOPHEN 650 MG RE SUPP: 650.0000 mg | Freq: Four times a day (QID) | RECTAL | Status: DC | PRN

## 2012-05-16 SURGICAL SUPPLY — 68 items
BAG SPEC THK2 15X12 ZIP CLS (MISCELLANEOUS) ×1
BAG ZIPLOCK 12X15 (MISCELLANEOUS) ×2 IMPLANT
BANDAGE ELASTIC 4 VELCRO ST LF (GAUZE/BANDAGES/DRESSINGS) ×2 IMPLANT
BANDAGE ELASTIC 6 VELCRO ST LF (GAUZE/BANDAGES/DRESSINGS) ×2 IMPLANT
BANDAGE ESMARK 6X9 LF (GAUZE/BANDAGES/DRESSINGS) ×1 IMPLANT
BANDAGE GAUZE ELAST BULKY 4 IN (GAUZE/BANDAGES/DRESSINGS) ×2 IMPLANT
BLADE SAG 18X100X1.27 (BLADE) ×2 IMPLANT
BLADE SAW SGTL 11.0X1.19X90.0M (BLADE) ×2 IMPLANT
BNDG CMPR 9X6 STRL LF SNTH (GAUZE/BANDAGES/DRESSINGS) ×1
BNDG ESMARK 6X9 LF (GAUZE/BANDAGES/DRESSINGS) ×2
BONE CEMENT GENTAMICIN (Cement) ×4 IMPLANT
CEMENT BONE GENTAMICIN 40 (Cement) ×2 IMPLANT
CLOTH BEACON ORANGE TIMEOUT ST (SAFETY) ×2 IMPLANT
CLSR STERI-STRIP ANTIMIC 1/2X4 (GAUZE/BANDAGES/DRESSINGS) ×4 IMPLANT
CUFF TOURN SGL QUICK 34 (TOURNIQUET CUFF) ×2
CUFF TRNQT CYL 34X4X40X1 (TOURNIQUET CUFF) ×1 IMPLANT
DRAPE EXTREMITY T 121X128X90 (DRAPE) ×2 IMPLANT
DRAPE INCISE IOBAN 66X45 STRL (DRAPES) ×2 IMPLANT
DRAPE LG THREE QUARTER DISP (DRAPES) ×2 IMPLANT
DRAPE POUCH INSTRU U-SHP 10X18 (DRAPES) ×2 IMPLANT
DRAPE U-SHAPE 47X51 STRL (DRAPES) ×2 IMPLANT
DRSG ADAPTIC 3X8 NADH LF (GAUZE/BANDAGES/DRESSINGS) ×2 IMPLANT
DRSG PAD ABDOMINAL 8X10 ST (GAUZE/BANDAGES/DRESSINGS) ×5 IMPLANT
DURAPREP 26ML APPLICATOR (WOUND CARE) ×2 IMPLANT
ELECT REM PT RETURN 9FT ADLT (ELECTROSURGICAL) ×2
ELECTRODE REM PT RTRN 9FT ADLT (ELECTROSURGICAL) ×1 IMPLANT
EVACUATOR 1/8 PVC DRAIN (DRAIN) ×2 IMPLANT
FACESHIELD LNG OPTICON STERILE (SAFETY) ×13 IMPLANT
GLOVE BIOGEL PI IND STRL 8 (GLOVE) ×1 IMPLANT
GLOVE BIOGEL PI IND STRL 8.5 (GLOVE) IMPLANT
GLOVE BIOGEL PI INDICATOR 8 (GLOVE) ×1
GLOVE BIOGEL PI INDICATOR 8.5 (GLOVE)
GLOVE ECLIPSE 8.0 STRL XLNG CF (GLOVE) ×4 IMPLANT
GLOVE SURG SS PI 6.5 STRL IVOR (GLOVE) ×6 IMPLANT
GOWN PREVENTION PLUS LG XLONG (DISPOSABLE) ×4 IMPLANT
GOWN STRL REIN XL XLG (GOWN DISPOSABLE) ×3 IMPLANT
HANDPIECE INTERPULSE COAX TIP (DISPOSABLE) ×2
IMMOBILIZER KNEE 20 (SOFTGOODS) ×2
IMMOBILIZER KNEE 20 THIGH 36 (SOFTGOODS) IMPLANT
KIT BASIN OR (CUSTOM PROCEDURE TRAY) ×2 IMPLANT
MANIFOLD NEPTUNE II (INSTRUMENTS) ×2 IMPLANT
NEEDLE HYPO 22GX1.5 SAFETY (NEEDLE) ×2 IMPLANT
NS IRRIG 1000ML POUR BTL (IV SOLUTION) ×2 IMPLANT
PACK TOTAL JOINT (CUSTOM PROCEDURE TRAY) ×2 IMPLANT
POSITIONER SURGICAL ARM (MISCELLANEOUS) ×2 IMPLANT
SET HNDPC FAN SPRY TIP SCT (DISPOSABLE) ×1 IMPLANT
SET PAD KNEE POSITIONER (MISCELLANEOUS) ×1 IMPLANT
SPONGE GAUZE 4X4 12PLY (GAUZE/BANDAGES/DRESSINGS) ×1 IMPLANT
SPONGE LAP 18X18 X RAY DECT (DISPOSABLE) ×2 IMPLANT
SPONGE SURGIFOAM ABS GEL 100 (HEMOSTASIS) ×2 IMPLANT
STAPLER VISISTAT 35W (STAPLE) IMPLANT
STRIP CLOSURE SKIN 1/2X4 (GAUZE/BANDAGES/DRESSINGS) ×1 IMPLANT
SUCTION FRAZIER 12FR DISP (SUCTIONS) ×2 IMPLANT
SUT BONE WAX W31G (SUTURE) ×2 IMPLANT
SUT MNCRL AB 4-0 PS2 18 (SUTURE) ×2 IMPLANT
SUT VIC AB 0 CT1 27 (SUTURE)
SUT VIC AB 0 CT1 27XBRD ANTBC (SUTURE) ×2 IMPLANT
SUT VIC AB 1 CT1 27 (SUTURE) ×4
SUT VIC AB 1 CT1 27XBRD ANTBC (SUTURE) ×2 IMPLANT
SUT VIC AB 2-0 CT1 27 (SUTURE) ×4
SUT VIC AB 2-0 CT1 TAPERPNT 27 (SUTURE) ×3 IMPLANT
SUT VLOC 180 0 24IN GS25 (SUTURE) ×2 IMPLANT
SYR 20CC LL (SYRINGE) ×2 IMPLANT
TOWEL OR 17X26 10 PK STRL BLUE (TOWEL DISPOSABLE) ×4 IMPLANT
TOWER CARTRIDGE SMART MIX (DISPOSABLE) ×2 IMPLANT
TRAY FOLEY CATH 14FRSI W/METER (CATHETERS) ×2 IMPLANT
WATER STERILE IRR 1500ML POUR (IV SOLUTION) ×3 IMPLANT
WRAP KNEE MAXI GEL POST OP (GAUZE/BANDAGES/DRESSINGS) ×3 IMPLANT

## 2012-05-16 NOTE — Progress Notes (Signed)
Recovery care complete.

## 2012-05-16 NOTE — Anesthesia Preprocedure Evaluation (Addendum)
Anesthesia Evaluation  Patient identified by MRN, date of birth, ID band Patient awake    Reviewed: Allergy & Precautions, H&P , NPO status , Patient's Chart, lab work & pertinent test results  Airway Mallampati: II TM Distance: >3 FB Neck ROM: Full    Dental No notable dental hx.    Pulmonary neg pulmonary ROS,  breath sounds clear to auscultation  Pulmonary exam normal       Cardiovascular negative cardio ROS  Rhythm:Regular Rate:Normal     Neuro/Psych negative neurological ROS  negative psych ROS   GI/Hepatic Neg liver ROS, Gilbert's Syndrome   Endo/Other  negative endocrine ROS  Renal/GU negative Renal ROS  negative genitourinary   Musculoskeletal negative musculoskeletal ROS (+)   Abdominal   Peds negative pediatric ROS (+)  Hematology negative hematology ROS (+)   Anesthesia Other Findings   Reproductive/Obstetrics negative OB ROS                          Anesthesia Physical Anesthesia Plan  ASA: II  Anesthesia Plan: General   Post-op Pain Management:    Induction: Intravenous  Airway Management Planned: Oral ETT  Additional Equipment:   Intra-op Plan:   Post-operative Plan: Extubation in OR  Informed Consent: I have reviewed the patients History and Physical, chart, labs and discussed the procedure including the risks, benefits and alternatives for the proposed anesthesia with the patient or authorized representative who has indicated his/her understanding and acceptance.   Dental advisory given  Plan Discussed with: CRNA  Anesthesia Plan Comments:         Anesthesia Quick Evaluation

## 2012-05-16 NOTE — Evaluation (Signed)
Physical Therapy Evaluation Patient Details Name: Cameron [name] MRN: [id number] DOB: [birth year] Today's Date: 05/16/2012 Time: 4782-9562 PT Time Calculation (min): 33 min  PT Assessment / Plan / Recommendation Clinical Impression  pt is s/p Left TKA and will benefit from PT to maximize independence for home with wife support    PT Assessment  Patient needs continued PT services    Follow Up Recommendations  Home health PT    Does the patient have the potential to tolerate intense rehabilitation      Barriers to Discharge        Equipment Recommendations  None recommended by PT (wife to bring in RW)    Recommendations for Other Services     Frequency 7X/week    Precautions / Restrictions Precautions Precautions: Knee Required Braces or Orthoses: Knee Immobilizer - Left Restrictions LLE Weight Bearing: Weight bearing as tolerated   Pertinent Vitals/Pain       Mobility  Bed Mobility Bed Mobility: Supine to Sit Supine to Sit: 4: Min assist Details for Bed Mobility Assistance: cues for technique Transfers Transfers: Sit to Stand;Stand to Sit Sit to Stand: 4: Min assist Stand to Sit: 4: Min assist Details for Transfer Assistance: cues for hand placement and LLE position Ambulation/Gait Ambulation/Gait Assistance: 4: Min assist Ambulation Distance (Feet): 5 Feet Assistive device: Rolling walker Ambulation/Gait Assistance Details: cues for sequence, RW position Gait Pattern: Step-to pattern;Antalgic    Shoulder Instructions     Exercises Total Joint Exercises Ankle Circles/Pumps: AROM;Both;5 reps Quad Sets: AROM;Left;5 reps   PT Diagnosis: Difficulty walking  PT Problem List: Decreased strength;Decreased range of motion;Decreased activity tolerance;Decreased balance;Decreased mobility;Decreased knowledge of use of DME PT Treatment Interventions: DME instruction;Gait training;Stair training;Functional mobility training;Therapeutic activities;Therapeutic  exercise;Patient/family education   PT Goals Acute Rehab PT Goals PT Goal Formulation: With patient Time For Goal Achievement: 05/30/12 Potential to Achieve Goals: Good PT Goal: Supine/Side to Sit - Progress: Goal set today Pt will go Sit to Supine/Side: with supervision PT Goal: Sit to Supine/Side - Progress: Goal set today Pt will go Sit to Stand: with supervision PT Goal: Sit to Stand - Progress: Goal set today Pt will go Stand to Sit: with supervision PT Goal: Stand to Sit - Progress: Goal set today Pt will Ambulate: 51 - 150 feet;with rolling walker;with supervision Pt will Go Up / Down Stairs: 3-5 stairs;with min assist;with rail(s);with least restrictive assistive device PT Goal: Up/Down Stairs - Progress: Goal set today Pt will Perform Home Exercise Program: with supervision, verbal cues required/provided PT Goal: Perform Home Exercise Program - Progress: Goal set today  Visit Information  Last PT Received On: 05/16/12 Assistance Needed: +1    Subjective Data  Subjective: pt with washcloth on head Patient Stated Goal: to return to PLOF   Prior Functioning  Home Living Lives With: Spouse Available Help at Discharge: Family;Available PRN/intermittently (wife) Type of Home: House Home Access: Stairs to enter [employer] of Steps: 3 Entrance Stairs-Rails: Right Home Layout: One level Home Adaptive Equipment: Walker - rolling;Bedside commode/3-in-1 Prior Function Level of Independence: Independent Able to Take Stairs?: Yes Communication Communication: No difficulties    Cognition  Overall Cognitive Status: Appears within functional limits for tasks assessed/performed Arousal/Alertness: Awake/alert Orientation Level: Appears intact for tasks assessed Behavior During Session: [facility] for tasks performed    Extremity/Trunk Assessment Right Upper Extremity Assessment RUE ROM/Strength/Tone: [facility] for tasks assessed Left Upper Extremity Assessment LUE  ROM/Strength/Tone: [facility] for tasks assessed Right Lower Extremity Assessment RLE ROM/Strength/Tone: [facility] for tasks  assessed Left Lower Extremity Assessment LLE ROM/Strength/Tone: Deficits;Unable to fully assess;Due to pain LLE ROM/Strength/Tone Deficits: ankle WFL grossly; knee and hip at least 2+/5   Balance    End of Session PT - End of Session Equipment Utilized During Treatment: Gait belt;Left knee immobilizer Activity Tolerance: Treatment limited secondary to medical complications (Comment) (nausea and dizziness) Patient left: in chair;with call bell/phone within reach;with family/visitor present Nurse Communication: Mobility status  GP     [facility] 05/16/2012, 4:51 PM

## 2012-05-16 NOTE — Brief Op Note (Signed)
05/16/2012  10:24 AM  PATIENT:  Cameron [name]  79 y.o. male  PRE-OPERATIVE DIAGNOSIS:  OSTEOARTHRITIS LEFT KNEE  POST-OPERATIVE DIAGNOSIS:  OSTEOARTHRITIS LEFT KNEE  PROCEDURE:  Procedure(s) (LRB) with comments: TOTAL KNEE ARTHROPLASTY (Left)  SURGEON:  Surgeon(s) and Role:    * Jacki Cones, MD - Primary  PHYSICIAN ASSISTANT: Dimitri Ped PA  ASSISTANTS: Dimitri Ped PA  ANESTHESIA:   general  EBL:  Total I/O In: 1000 [I.V.:1000] Out: 125 [Urine:100; Blood:25]  BLOOD ADMINISTERED:none  DRAINS: (One) Hemovact drain(s) in the Right Knee with  Suction Open   LOCAL MEDICATIONS USED:  BUPIVICAINE 20cc mixed with 20cc of Normal Saline  SPECIMEN:  No Specimen  DISPOSITION OF SPECIMEN:  N/A  COUNTS:  YES  TOURNIQUET:  * Missing tourniquet times found for documented tourniquets in log:  72586 *  DICTATION: .Other Dictation: Dictation Number [id number]  PLAN OF CARE: Admit to inpatient   PATIENT DISPOSITION:  PACU - hemodynamically stable.   Delay start of Pharmacological VTE agent (>24hrs) due to surgical blood loss or risk of bleeding: yes

## 2012-05-16 NOTE — Transfer of Care (Signed)
Immediate Anesthesia Transfer of Care Note  Patient: Cameron [name]  Procedure(s) Performed: Procedure(s) (LRB) with comments: TOTAL KNEE ARTHROPLASTY (Left)  Patient Location: PACU  Anesthesia Type:General  Level of Consciousness: awake, alert , oriented and patient cooperative  Airway & Oxygen Therapy: Patient Spontanous Breathing and Patient connected to face mask oxygen  Post-op Assessment: Report given to PACU RN, Post -op Vital signs reviewed and stable and Patient moving all extremities  Post vital signs: Reviewed and stable  Complications: No apparent anesthesia complications

## 2012-05-16 NOTE — Progress Notes (Signed)
Utilization review completed.  

## 2012-05-16 NOTE — Progress Notes (Signed)
X-ray results noted of left knee done post-op.

## 2012-05-16 NOTE — Progress Notes (Signed)
Portable AP and Lateral Left Knee X-rays done. 

## 2012-05-16 NOTE — Interval H&P Note (Signed)
History and Physical Interval Note:  05/16/2012 8:11 AM  Cameron [name]  has presented today for surgery, with the diagnosis of OSTEOARTHRITIS LEFT KNEE  The various methods of treatment have been discussed with the patient and family. After consideration of risks, benefits and other options for treatment, the patient has consented to  Procedure(s) (LRB) with comments: TOTAL KNEE ARTHROPLASTY (Left) as a surgical intervention .  The patient's history has been reviewed, patient examined, no change in status, stable for surgery.  I have reviewed the patient's chart and labs.  Questions were answered to the patient's satisfaction.     Tannya Gonet A

## 2012-05-16 NOTE — Preoperative (Signed)
Beta Blockers   Reason not to administer Beta Blockers:Not Applicable 

## 2012-05-16 NOTE — Progress Notes (Signed)
Procedural care complete 

## 2012-05-16 NOTE — Anesthesia Postprocedure Evaluation (Signed)
  Anesthesia Post-op Note  Patient: Cameron [name]  Procedure(s) Performed: Procedure(s) (LRB): TOTAL KNEE ARTHROPLASTY (Left)  Patient Location: PACU  Anesthesia Type: General  Level of Consciousness: awake and alert   Airway and Oxygen Therapy: Patient Spontanous Breathing  Post-op Pain: mild  Post-op Assessment: Post-op Vital signs reviewed, Patient's Cardiovascular Status Stable, Respiratory Function Stable, Patent Airway and No signs of Nausea or vomiting  Last Vitals:  Filed Vitals:   05/16/12 1215  BP: 129/53  Pulse: 79  Temp: 36.8 C  Resp: 12    Post-op Vital Signs: stable   Complications: No apparent anesthesia complications

## 2012-05-17 LAB — CBC
Hemoglobin: 11.5 g/dL — ABNORMAL LOW (ref 13.0–17.0)
MCH: 30.6 pg (ref 26.0–34.0)
MCHC: 33.7 g/dL (ref 30.0–36.0)
Platelets: 161 10*3/uL (ref 150–400)
RDW: 13.2 % (ref 11.5–15.5)

## 2012-05-17 LAB — BASIC METABOLIC PANEL
Calcium: 8.6 mg/dL (ref 8.4–10.5)
GFR calc Af Amer: >90 mL/min (ref >90)
GFR calc non Af Amer: 80 mL/min — ABNORMAL LOW (ref >90)
Glucose, Bld: 142 mg/dL — ABNORMAL HIGH (ref 70–99)
Potassium: 4.1 mEq/L (ref 3.5–5.1)
Sodium: 137 mEq/L (ref 135–145)

## 2012-05-17 MED FILL — Acetaminophen IV Soln 10 MG/ML: INTRAVENOUS | Qty: 100 | Status: AC

## 2012-05-17 NOTE — Plan of Care (Signed)
Problem: Consults Goal: Diagnosis- Total Joint Replacement Outcome: Completed/Met Date Met:  05/17/12 Primary Total Knee LEFT

## 2012-05-17 NOTE — Progress Notes (Signed)
Physical Therapy Treatment Patient Details Name: Cameron [name] MRN: [id number] DOB: [birth year] Today's Date: 05/17/2012 Time: 0920-1000 PT Time Calculation (min): 40 min  PT Assessment / Plan / Recommendation Comments on Treatment Session  POD #1 L TKR am session.  Instructed pt on KI use for amb.  Assisted OOB to amb in hallway then perform TKR TE's.  Applied ICE.    Follow Up Recommendations  Home health PT     Does the patient have the potential to tolerate intense rehabilitation     Barriers to Discharge        Equipment Recommendations  Other (comment);None recommended by PT (wife to bring RW)    Recommendations for Other Services    Frequency 7X/week   Plan Discharge plan remains appropriate    Precautions / Restrictions Precautions Precautions: Knee Precaution Comments: Instructed pt on KI use for amb Required Braces or Orthoses: Knee Immobilizer - Left Knee Immobilizer - Left: Discontinue once straight leg raise with < 10 degree lag Restrictions Weight Bearing Restrictions: No LLE Weight Bearing: Weight bearing as tolerated   Pertinent Vitals/Pain C/o 5/10 after session ICE applied    Mobility  Bed Mobility Bed Mobility: Supine to Sit Supine to Sit: 4: Min assist Details for Bed Mobility Assistance: Min assist to support L LE off bed and increased time Transfers Transfers: Sit to Stand;Stand to Sit Sit to Stand: 4: Min guard;From elevated surface;From bed Stand to Sit: 4: Min guard;To chair/3-in-1 Details for Transfer Assistance: 25% vVC's on proper tech and increased time. Ambulation/Gait Ambulation/Gait Assistance: 4: Min assist;4: Min Government social research officer (Feet): 67 Feet Assistive device: Rolling walker Ambulation/Gait Assistance Details: 25% VC's on proper sequencing and upright posture Gait Pattern: Step-to pattern;Decreased stance time - left;Trunk flexed Gait velocity: decreased    Exercises Total Joint Exercises Ankle Circles/Pumps:  AROM;Both;10 reps;Supine Quad Sets: AROM;Both;10 reps;Supine Gluteal Sets: AROM;Both;10 reps;Supine Towel Squeeze: AROM;Both;10 reps;Supine Heel Slides: AAROM;Left;10 reps;Supine Hip ABduction/ADduction: AAROM;Left;10 reps;Supine Straight Leg Raises: AAROM;Left;10 reps;Supine    PT Goals                                    progressing    Visit Information  Last PT Received On: 05/17/12 Assistance Needed: +1    Subjective Data  Subjective: They didn't let me sleep much last night Patient Stated Goal: home   Cognition  Overall Cognitive Status: Appears within functional limits for tasks assessed/performed Arousal/Alertness: Awake/alert Orientation Level: Oriented X4 / Intact Behavior During Session: [facility] for tasks performed    Balance  Balance Balance Assessed: No  End of Session PT - End of Session Equipment Utilized During Treatment: Gait belt;Left knee immobilizer Activity Tolerance: Patient tolerated treatment well Patient left: in chair;with call bell/phone within reach   Felecia Shelling  PTA [facility]  Acute  Rehab Pager      [phone]

## 2012-05-17 NOTE — Progress Notes (Signed)
Physical Therapy Treatment Patient Details Name: Cameron [name] MRN: [id number] DOB: 18-Sep-1932 Today's Date: 05/17/2012 Time: 1308-6578 PT Time Calculation (min): 43 min  PT Assessment / Plan / Recommendation Comments on Treatment Session  POD # 1 pm session.  Assisted pt out of recliner to amb in hallway, then back to bed to perform TKR TE's.  Spouse present and observed. ICE applied to L knee. Pt plans to D/C to home "Saturday per Dr Cameron [name]'.    Follow Up Recommendations  Home health PT     Does the patient have the potential to tolerate intense rehabilitation     Barriers to Discharge        Equipment Recommendations  Other (comment);None recommended by PT (wife to bring RW)    Recommendations for Other Services    Frequency 7X/week   Plan Discharge plan remains appropriate    Precautions / Restrictions Precautions Precautions: Knee Precaution Comments: Instructed pt on KI use for amb Required Braces or Orthoses: Knee Immobilizer - Left Knee Immobilizer - Left: Discontinue once straight leg raise with < 10 degree lag Restrictions Weight Bearing Restrictions: No LLE Weight Bearing: Weight bearing as tolerated   Pertinent Vitals/Pain C/o 4/10 after session Pre medicated ICE applied    Mobility  Bed Mobility Bed Mobility: Sit to supine Supine to Sit: 4: Min assist Details for Bed Mobility Assistance: Min assist to support L LE on bed and increased time Transfers Transfers: Sit to Stand;Stand to Sit Sit to Stand: 4: Min guard;From chair Stand to Sit: 4: Min guard;To bed Details for Transfer Assistance: 25% vVC's on proper tech and increased time. Ambulation/Gait Ambulation/Gait Assistance: 4: Min assist;4: Min Government social research officer (Feet): 88 Feet Assistive device: Rolling walker Ambulation/Gait Assistance Details: 25% VC's on proper sequencing and upright posture Gait Pattern: Step-to pattern;Decreased stance time - left;Trunk flexed Gait velocity: decreased      Exercises Total Joint Exercises Ankle Circles/Pumps: AROM;Both;10 reps;Supine Quad Sets: AROM;Both;10 reps;Supine Gluteal Sets: AROM;Both;10 reps;Supine Towel Squeeze: AROM;Both;10 reps;Supine Heel Slides: AAROM;Left;10 reps;Supine Hip ABduction/ADduction: AAROM;Left;10 reps;Supine Straight Leg Raises: AAROM;Left;10 reps;Supine    PT Goals                                                  progressing    Visit Information  Last PT Received On: 05/17/12 Assistance Needed: +1    Subjective Data  Subjective: Dr Cameron [name] told me I would be going home Saturday Patient Stated Goal: home   Cognition  Overall Cognitive Status: Appears within functional limits for tasks assessed/performed Arousal/Alertness: Awake/alert Orientation Level: Oriented X4 / Intact Behavior During Session: [facility] for tasks performed    Balance  Balance Balance Assessed: No  End of Session PT - End of Session Equipment Utilized During Treatment: Gait belt;Left knee immobilizer Activity Tolerance: Patient tolerated treatment well Patient left: in bed;with call bell/phone within reach   Felecia Shelling  PTA [facility]  Acute  Rehab Pager      [phone]

## 2012-05-17 NOTE — Evaluation (Signed)
Occupational Therapy Evaluation Patient Details Name: Cameron [name] MRN: [id number] DOB: Apr 18, 1933 Today's Date: 05/17/2012 Time: 7829-5621 OT Time Calculation (min): 32 min  OT Assessment / Plan / Recommendation Clinical Impression  Pt is 79 yo male admitted for L TKR after failing medical managment who now has the deficits listed below.  Pt lives with his wife and has  assist after d/c but would benefit from cont OT to reach mod I level with adls and adl transfers.    OT Assessment  Patient needs continued OT Services    Follow Up Recommendations  Home health OT;Other (comment) (vs nothing depending on progress.)    Barriers to Discharge None    Equipment Recommendations  None recommended by OT    Recommendations for Other Services    Frequency  Min 2X/week    Precautions / Restrictions Precautions Precautions: Knee Required Braces or Orthoses: Knee Immobilizer - Left Restrictions Weight Bearing Restrictions: No LLE Weight Bearing: Weight bearing as tolerated   Pertinent Vitals/Pain Pt w 1/10 pain.  Received pain meds before OT treatment.  All other vitals normal.    ADL  Eating/Feeding: Performed;Independent Where Assessed - Eating/Feeding: Chair Grooming: Performed;Wash/dry hands;Supervision/safety Where Assessed - Grooming: Unsupported standing Upper Body Bathing: Performed;Set up Where Assessed - Upper Body Bathing: Supported sitting Lower Body Bathing: Performed;Minimal assistance Where Assessed - Lower Body Bathing: Supported sit to stand Upper Body Dressing: Performed;Set up Where Assessed - Upper Body Dressing: Supported sitting Lower Body Dressing: Performed;Moderate assistance Where Assessed - Lower Body Dressing: Supported sit to stand Toilet Transfer: Performed;Min Pension scheme manager Method: Sit to stand;Stand pivot Acupuncturist: Comfort height toilet;Grab bars Toileting - Architect and Hygiene:  Simulated;Supervision/safety Where Assessed - Engineer, mining and Hygiene: Standing Equipment Used: Knee Immobilizer;Rolling walker Transfers/Ambulation Related to ADLs: Pt walked to bathroom and back only needing assist for lines ADL Comments: Pt fairly I but needs assist for LE adls due to pain.    OT Diagnosis: Generalized weakness;Acute pain  OT Problem List: Decreased knowledge of use of DME or AE;Pain OT Treatment Interventions: Self-care/ADL training;DME and/or AE instruction   OT Goals Acute Rehab OT Goals OT Goal Formulation: With patient/family Time For Goal Achievement: 05/24/12 Potential to Achieve Goals: Good ADL Goals Pt Will Perform Grooming: Independently;Standing at sink ADL Goal: Grooming - Progress: Goal set today Pt Will Perform Lower Body Bathing: with supervision;Other (comment) (standing in shower) ADL Goal: Lower Body Bathing - Progress: Goal set today Pt Will Perform Lower Body Dressing: with min assist;Sit to stand from chair ADL Goal: Lower Body Dressing - Progress: Goal set today Pt Will Perform Tub/Shower Transfer: Tub transfer;with min assist ADL Goal: Tub/Shower Transfer - Progress: Goal set today Additional ADL Goal #1: Pt will complete toilet transfer with 3:1 over commode with mod I. ADL Goal: Additional Goal #1 - Progress: Goal set today  Visit Information  Last OT Received On: 05/17/12 Assistance Needed: +1    Subjective Data  Subjective: " I feel pretty good today." Patient Stated Goal: to go home   Prior Functioning     Home Living Lives With: Spouse Available Help at Discharge: Family;Available PRN/intermittently Type of Home: House Home Access: Stairs to enter [employer] of Steps: 3 Entrance Stairs-Rails: Right Home Layout: One level Bathroom Shower/Tub: Forensic scientist: Standard Home Adaptive Equipment: Walker - rolling;Bedside commode/3-in-1 Prior Function Level of  Independence: Independent Able to Take Stairs?: Yes Driving: Yes Vocation: Retired Comments: plays lots of golf Communication Communication:  No difficulties Dominant Hand: Right         Vision/Perception Vision - Assessment Eye Alignment: Within Functional Limits Vision Assessment: Vision not tested   Cognition  Overall Cognitive Status: Appears within functional limits for tasks assessed/performed Arousal/Alertness: Awake/alert Orientation Level: Oriented X4 / Intact Behavior During Session: [facility] for tasks performed    Extremity/Trunk Assessment Right Upper Extremity Assessment RUE ROM/Strength/Tone: Within functional levels RUE Sensation: WFL - Light Touch RUE Coordination: WFL - gross/fine motor Left Upper Extremity Assessment LUE ROM/Strength/Tone: Within functional levels LUE Sensation: WFL - Light Touch LUE Coordination: WFL - gross/fine motor Trunk Assessment Trunk Assessment: Normal     Mobility Transfers Transfers: Sit to Stand;Stand to Sit Sit to Stand: 4: Min guard Stand to Sit: 4: Min guard Details for Transfer Assistance: cues for hand placement and LLE position     Shoulder Instructions     Exercise     Balance Balance Balance Assessed: No   End of Session OT - End of Session Equipment Utilized During Treatment: Left knee immobilizer Activity Tolerance: Patient tolerated treatment well Patient left: in chair;with call bell/phone within reach;with family/visitor present Nurse Communication: Mobility status  GO     Hope Budds 05/17/2012, 11:18 AM [phone]

## 2012-05-17 NOTE — Op Note (Signed)
NAMEROSALIE, [name]:  [id number]  MEDICAL RECORD NO.:  [id number]  LOCATION:  1602                         FACILITY:  [facility]  PHYSICIAN:  Georges Lynch. Madsen Riddle, M.D.DATE OF BIRTH:  Mar 31, 1933  DATE OF PROCEDURE:  05/16/2012 DATE OF DISCHARGE:                              OPERATIVE REPORT   SURGEON:  Georges Lynch. Darrelyn Hillock, M.D.  ASSISTANT:  Dimitri Ped, [place]  PREOPERATIVE DIAGNOSES: 1. Severe flexion contracture, left knee. 2. Severe degenerative arthritis with bone-on-bone, left knee.  Note, the patient went through conservative care and really did not improve.  OPERATION:  Left total knee arthroplasty utilizing DePuy system.  The femur was a size 5 left posterior cruciate sacrificing prosthesis.  The tibial tray was a size 4, the insert was a size 5, 10-mm thickness rotating platform.  The patella was a size 41 patella.  PROCEDURE:  Under general anesthesia and routine orthopedic prep and draping, the left lower extremity was carried out.  At this time, the appropriate time-out was first carried out.  I also marked the appropriate left leg in the holding area.  Following that, the leg was exsanguinated with an Esmarch.  Tourniquet was elevated at 325 mmHg. The knee was flexed and incision was made over the anterior aspect of the left knee.  Bleeders were identified and cauterized.  After two flaps were created, I then carried on the medial parapatellar incision reflected the patella laterally and then did medial lateral meniscectomies and excised the anterior and posterior cruciate ligaments.  At this time, I did a synovectomy.  I then made my initial drill hole in the intercondylar notch, and the canal finer then was inserted.  We thoroughly irrigated out the canal.  We removed 12 mm thickness off the distal femur, and we finally had go back and removed two more because of the severe contracture.  After the femur was prepared, we then measured the femur  to be a size 5.  We then cut our anterior, posterior, chamfering cuts for a size 5 left femur.  After that, we then prepared our tibial plateau in the usual fashion utilizing intramedullary guide.  I removed 6-mm thickness off the tibia at this point.  The keel cut was made in the tibia after we went through the proper releases and after we utilized our flexion and extension gap guides.  We then cut our keel cut out of the tibia and then the tibia was measured to be a size 4.  We then did our notch cut out of the distal femur in the usual fashion.  Trial components were inserted and we had an excellent fit for his flexion and extension and we were able to completely correct the flexion contracture.  I then measured the patella to be a size 41.  I did a patellar resurfacing procedure.  Three drill holes were made in the patella in the usual fashion.  We then removed all trial components, thoroughly water picked out the knee, dried the knee and cemented all three components in simultaneously.  I did utilized the gentamicin in the cement.  He had 2 g of IV Ancef preop.  After all  three components were cemented, I removed all loose pieces of cement.  We went through range of motion again and finally accepted a 10-mm thickness tibial insert.  The trial insert was removed. We water picked out the knee, made sure there were no loose pieces of cement posteriorly.  I then injected a mixture of 20 mL of Exparel with 20 mL of normal saline.  Part of that mixture was used at this stage of the procedure and the remaining part was used at the end of the procedure.  We then inserted our rotating platform, size 5, 10-mm thickness.  We reduced the knee had excellent function.  We then inserted a Hemovac drain and then closed the wound layers in usual fashion.  Sterile dressings were applied.          ______________________________ Georges Lynch Darrelyn Hillock, M.D.     RAG/MEDQ  D:  05/16/2012  T:   05/17/2012  Job:  [id number]  cc:   Ernestina Penna, M.D. Fax: [phone]

## 2012-05-17 NOTE — Progress Notes (Signed)
Subjective: 1 Day Post-Op Procedure(s) (LRB): TOTAL KNEE ARTHROPLASTY (Left) Patient reports pain as 3 on 0-10 scale.Hemovac Dcd today.  Objective: Vital signs in last 24 hours: Temp:  [97.2 F (36.2 C)-98.6 F (37 C)] 98.1 F (36.7 C) (01/23 1402) Pulse Rate:  [61-84] 78  (01/23 1402) Resp:  [14-18] 14  (01/23 1402) BP: (96-138)/(55-82) 113/66 mmHg (01/23 1402) SpO2:  [95 %-99 %] 96 % (01/23 1402)  Intake/Output from previous day: 01/22 0701 - 01/23 0700 In: 4285 [P.O.:360; I.V.:3870; IV Piggyback:55] Out: 2140 [Urine:1650; Drains:465; Blood:25] Intake/Output this shift: Total I/O In: 898.3 [P.O.:360; I.V.:538.3] Out: 680 [Urine:650; Drains:30]   Basename 05/17/12 0445  HGB 11.5*    Basename 05/17/12 0445  WBC 12.8*  RBC 3.76*  HCT 34.1*  PLT 161    Basename 05/17/12 0445  NA 137  K 4.1  CL 101  CO2 30  BUN 20  CREATININE 0.91  GLUCOSE 142*  CALCIUM 8.6   No results found for this basename: LABPT:2,INR:2 in the last 72 hours  Neurologically intact Dorsiflexion/Plantar flexion intact  Assessment/Plan: 1 Day Post-Op Procedure(s) (LRB): TOTAL KNEE ARTHROPLASTY (Left) Up with therapy. Doing well today. Plan on DC Saturday.  Blaize Epple A 05/17/2012, 2:07 PM

## 2012-05-18 DIAGNOSIS — D62 Acute posthemorrhagic anemia: Secondary | ICD-10-CM | POA: Diagnosis not present

## 2012-05-18 LAB — CBC
MCH: 31.2 pg (ref 26.0–34.0)
Platelets: 111 10*3/uL — ABNORMAL LOW (ref 150–400)
RBC: 3.08 MIL/uL — ABNORMAL LOW (ref 4.22–5.81)

## 2012-05-18 LAB — BASIC METABOLIC PANEL
CO2: 27 mEq/L (ref 19–32)
Calcium: 8.2 mg/dL — ABNORMAL LOW (ref 8.4–10.5)
GFR calc non Af Amer: 86 mL/min — ABNORMAL LOW (ref >90)
Potassium: 3.8 mEq/L (ref 3.5–5.1)
Sodium: 135 mEq/L (ref 135–145)

## 2012-05-18 MED ORDER — RIVAROXABAN 10 MG PO TABS: 10.0000 mg | ORAL_TABLET | Freq: Every day | ORAL | Status: DC

## 2012-05-18 MED ORDER — METHOCARBAMOL 500 MG PO TABS: 500.0000 mg | ORAL_TABLET | Freq: Four times a day (QID) | ORAL | Status: DC | PRN

## 2012-05-18 MED ORDER — OXYCODONE-ACETAMINOPHEN 5-325 MG PO TABS: 1.0000 | ORAL_TABLET | ORAL | Status: DC | PRN

## 2012-05-18 MED ORDER — DOCUSATE SODIUM 100 MG PO CAPS
100.0000 mg | ORAL_CAPSULE | ORAL | Status: AC
  Administered 2012-05-18: 100 mg via ORAL

## 2012-05-18 MED ORDER — FERROUS SULFATE 325 (65 FE) MG PO TABS: 325.0000 mg | ORAL_TABLET | Freq: Three times a day (TID) | ORAL | Status: DC

## 2012-05-18 NOTE — Progress Notes (Signed)
Physical Therapy Treatment Patient Details Name: Cameron [name] MRN: [id number] DOB: 06-25-1932 Today's Date: 05/18/2012 Time: 4098-1191 PT Time Calculation (min): 26 min  PT Assessment / Plan / Recommendation Comments on Treatment Session  POD #2 pm session.  Applied KI, amb in halwway then assisted back to bed to perform TE's.     Follow Up Recommendations  Home health PT     Does the patient have the potential to tolerate intense rehabilitation     Barriers to Discharge        Equipment Recommendations  None recommended by PT (wife to bring in RW to see if it fits)    Recommendations for Other Services    Frequency 7X/week   Plan Discharge plan remains appropriate    Precautions / Restrictions Precautions Precautions: Knee Precaution Comments: Instructed pt on KI use for amb  Required Braces or Orthoses: Knee Immobilizer - Left Knee Immobilizer - Left: Discontinue once straight leg raise with < 10 degree lag Restrictions Weight Bearing Restrictions: No LLE Weight Bearing: Weight bearing as tolerated   Pertinent Vitals/Pain C/o 5/10 with TE's ICE applied   Mobility  Bed Mobility Bed Mobility: Supine to Sit;Sitting - Scoot to Edge of Bed Supine to Sit: 4: Min assist Sitting - Scoot to [place] of Bed: 4: Min guard Details for Bed Mobility Assistance: increased time Transfers Transfers: Sit to Stand;Stand to Sit Sit to Stand: 5: Supervision;From bed Stand to Sit: 5: Supervision;To chair/3-in-1 Details for Transfer Assistance: 25% vVC's on proper tech and increased time Ambulation/Gait Ambulation/Gait Assistance: 4: Min guard Ambulation Distance (Feet): 94 Feet Assistive device: Rolling walker Ambulation/Gait Assistance Details: 25% VC's on proper sequencing and upright posture  Gait Pattern: Step-to through;Decreased stance time - left;Decreased stride length;Trunk flexed Gait velocity: decreased    Exercises Total Joint Exercises Ankle Circles/Pumps: AROM;Both;10  reps;Supine Quad Sets: AROM;Both;10 reps;Supine Gluteal Sets: AROM;Both;10 reps;Supine Towel Squeeze: AROM;Both;10 reps;Supine Short Arc Quad: 10 reps;Supine;AAROM;Left Heel Slides: AAROM;10 reps;Supine;Left Hip ABduction/ADduction: AROM;Left;10 reps;Supine Straight Leg Raises: AAROM;Left;10 reps;Supine    PT Goals                                        progressing    Visit Information  Last PT Received On: 05/18/12 Assistance Needed: +1    Subjective Data      Cognition       Balance   good  End of Session PT - End of Session Equipment Utilized During Treatment: Gait belt Activity Tolerance: Patient tolerated treatment well Patient left: in bed;with call bell/phone within reach;with family/visitor present   Felecia Shelling  PTA [facility]  Acute  Rehab Pager      [phone]

## 2012-05-18 NOTE — Care Management Note (Addendum)
    Page 1 of 2   05/18/2012     5:17:24 PM   CARE MANAGEMENT NOTE 05/18/2012  Patient:  Cameron [name], Cameron [name]   Account Number:  [id number]  Date Initiated:  05/18/2012  Documentation initiated by:  Colleen Can  Subjective/Objective Assessment:   dx ostoearthritis left knee; total knee replacemnt     Action/Plan:   CM spoke with patient and spouse. Plans are for patient to return to his home in [facility] where spouse will be caregiver. He already has RW and commode seat. He will require HHpt services   Anticipated DC Date:  05/19/2012   Anticipated DC Plan:  HOME W HOME HEALTH SERVICES  In-house referral  Clinical Social Worker      DC Planning Services  CM consult      [facility] Choice  HOME HEALTH   Choice offered to / List presented to:  C-1 Patient   DME arranged  NA      DME agency  NA     HH arranged  HH-2 PT      HH agency  [facility]   Status of service:  Completed, signed off Medicare Important Message given?  NA - LOS <3 / Initial given by admissions (If response is "NO", the following Medicare IM given date fields will be blank) Date Medicare IM given:   Date Additional Medicare IM given:    Discharge Disposition:    Per UR Regulation:    If discussed at Long Length of Stay Meetings, dates discussed:    Comments:  05/18/2012 Colleen Can BSN RN CCM [phone] [facility] will provide HHpt services with start date of Sunday 05/20/2012

## 2012-05-18 NOTE — Progress Notes (Signed)
Occupational Therapy Treatment Patient Details Name: Cameron [name] MRN: [id number] DOB: [birth year] Today's Date: 05/18/2012 Time: 7829-5621 OT Time Calculation (min): 24 min  OT Assessment / Plan / Recommendation Comments on Treatment Session Pt more I today with all adls and adl tranfers.    Follow Up Recommendations  Home health OT;Other (comment)    Barriers to Discharge       Equipment Recommendations  None recommended by OT    Recommendations for Other Services    Frequency Min 2X/week   Plan Discharge plan remains appropriate    Precautions / Restrictions Precautions Precautions: Knee Precaution Comments: Instructed pt on KI use for amb Required Braces or Orthoses: Knee Immobilizer - Left Knee Immobilizer - Left: Discontinue once straight leg raise with < 10 degree lag Restrictions Weight Bearing Restrictions: No LLE Weight Bearing: Weight bearing as tolerated   Pertinent Vitals/Pain Pt with 2/10 pain.  Ice applied.  All other vitals normal.    ADL  Eating/Feeding: Performed;Independent Where Assessed - Eating/Feeding: Chair Grooming: Performed;Wash/dry hands;Modified independent Where Assessed - Grooming: Unsupported standing Lower Body Dressing: Performed;Minimal assistance Where Assessed - Lower Body Dressing: Supported sit to stand Toilet Transfer: Research scientist (life sciences) Method: Sit to stand;Stand pivot Acupuncturist: Comfort height toilet;Grab bars Toileting - Architect and Hygiene: Performed;Supervision/safety Where Assessed - Engineer, mining and Hygiene: Standing Tub/Shower Transfer: Performed;Simulated;Minimal Radiation protection practitioner Method: Science writer: Other (comment) (simulated stepping over tub using garbage can) Equipment Used: Knee Immobilizer;Rolling walker Transfers/Ambulation Related to ADLs: Pt distant S with all transfers and ambulation. ADL  Comments: Pt more I with LE adls today.    OT Diagnosis:    OT Problem List:   OT Treatment Interventions:     OT Goals Acute Rehab OT Goals OT Goal Formulation: With patient/family Time For Goal Achievement: 05/24/12 Potential to Achieve Goals: Good ADL Goals Pt Will Perform Grooming: Independently;Standing at sink ADL Goal: Grooming - Progress: Met Pt Will Perform Lower Body Bathing: with supervision;Other (comment) Pt Will Perform Lower Body Dressing: with min assist;Sit to stand from chair ADL Goal: Lower Body Dressing - Progress: Met Pt Will Perform Tub/Shower Transfer: Tub transfer;with min assist ADL Goal: Tub/Shower Transfer - Progress: Met Additional ADL Goal #1: Pt will complete toilet transfer with 3:1 over commode with mod I. ADL Goal: Additional Goal #1 - Progress: Met  Visit Information  Last OT Received On: 05/18/12 Assistance Needed: +1    Subjective Data      Prior Functioning       Cognition  Overall Cognitive Status: Appears within functional limits for tasks assessed/performed Arousal/Alertness: Awake/alert Orientation Level: Oriented X4 / Intact Behavior During Session: [facility] for tasks performed    Mobility  Shoulder Instructions Transfers Transfers: Sit to Stand;Stand to Sit Sit to Stand: 6: Modified independent (Device/Increase time) Stand to Sit: 6: Modified independent (Device/Increase time)       Exercises      Balance Balance Balance Assessed: No   End of Session OT - End of Session Activity Tolerance: Patient tolerated treatment well Patient left: in chair;with call bell/phone within reach;with family/visitor present Nurse Communication: Mobility status  GO     Hope Budds [phone] 05/18/2012, 10:14 AM

## 2012-05-18 NOTE — Progress Notes (Addendum)
Subjective: 2 Days Post-Op Procedure(s) (LRB): TOTAL KNEE ARTHROPLASTY (Left) Patient reports pain as 3 on 0-10 scale.  Was Catherized this A.M. But voided since.   Objective: Vital signs in last 24 hours: Temp:  [98.1 F (36.7 C)-98.7 F (37.1 C)] 98.3 F (36.8 C) (01/24 0604) Pulse Rate:  [74-106] 80  (01/24 0604) Resp:  [14-16] 14  (01/24 0604) BP: (98-136)/(57-70) 98/57 mmHg (01/24 0604) SpO2:  [94 %-99 %] 94 % (01/24 0604)  Intake/Output from previous day: 01/23 0701 - 01/24 0700 In: 2711.3 [P.O.:1960; I.V.:751.3] Out: 1390 [Urine:1360; Drains:30] Intake/Output this shift:     Basename 05/18/12 0427 05/17/12 0445  HGB 9.6* 11.5*    Basename 05/18/12 0427 05/17/12 0445  WBC 9.7 12.8*  RBC 3.08* 3.76*  HCT 27.9* 34.1*  PLT 111* 161    Basename 05/18/12 0427 05/17/12 0445  NA 135 137  K 3.8 4.1  CL 101 101  CO2 27 30  BUN 21 20  CREATININE 0.75 0.91  GLUCOSE 126* 142*  CALCIUM 8.2* 8.6   No results found for this basename: LABPT:2,INR:2 in the last 72 hours  Neurologically intact  Assessment/Plan: 2 Days Post-Op Procedure(s) (LRB): TOTAL KNEE ARTHROPLASTY (Left) Plan for discharge tomorrow.Dressing changed and wound looks fine. Will DC tomorrow.  Cameron [name] A 05/18/2012, 7:18 AM

## 2012-05-18 NOTE — Progress Notes (Signed)
Physical Therapy Treatment Patient Details Name: Cameron [name] MRN: [id number] DOB: 06-22-1932 Today's Date: 05/18/2012 Time: 2841-3244 PT Time Calculation (min): 39 min  PT Assessment / Plan / Recommendation Comments on Treatment Session  POD # 2 am session L TKR.  Assisted pt OOB to amb in hallway then perform TE's.  Pt progressing well and plans to D/C to home with spouse tomorrow.    Follow Up Recommendations  Home health PT     Does the patient have the potential to tolerate intense rehabilitation     Barriers to Discharge        Equipment Recommendations  None recommended by PT (wife to bring in RW to see if it fits)    Recommendations for Other Services    Frequency 7X/week   Plan Discharge plan remains appropriate    Precautions / Restrictions Precautions Precautions: Knee Precaution Comments: Instructed pt on KI use for amb  Required Braces or Orthoses: Knee Immobilizer - Left Knee Immobilizer - Left: Discontinue once straight leg raise with < 10 degree lag Restrictions Weight Bearing Restrictions: No LLE Weight Bearing: Weight bearing as tolerated   Pertinent Vitals/Pain C/o 3/10 during TE's ICE applied    Mobility  Bed Mobility Bed Mobility: Supine to Sit;Sitting - Scoot to Edge of Bed Supine to Sit: 4: Min assist Sitting - Scoot to [place] of Bed: 4: Min guard Details for Bed Mobility Assistance: increased time Transfers Transfers: Sit to Stand;Stand to Sit Sit to Stand: 5: Supervision;From bed Stand to Sit: 5: Supervision;To chair/3-in-1 Details for Transfer Assistance: 25% vVC's on proper tech and increased time Ambulation/Gait Ambulation/Gait Assistance: 4: Min guard Ambulation Distance (Feet): 86 Feet Assistive device: Rolling walker Ambulation/Gait Assistance Details: 25% VC's on proper sequencing and upright posture  Gait Pattern: Step-to pattern;Decreased stance time - left;Decreased stride length;Trunk flexed Gait velocity: decreased      Exercises Total Joint Exercises Ankle Circles/Pumps: AROM;Both;10 reps;Supine Quad Sets: AROM;Both;10 reps;Supine Gluteal Sets: AROM;Both;10 reps;Supine Towel Squeeze: AROM;Both;10 reps;Supine Short Arc Quad: 10 reps;Supine;AAROM;Left Heel Slides: AAROM;10 reps;Supine;Left Hip ABduction/ADduction: AROM;Left;10 reps;Supine Straight Leg Raises: AAROM;Left;10 reps;Supine    PT Goals                                                 Progressing well    Visit Information  Last PT Received On: 05/18/12 Assistance Needed: +1    Subjective Data      Cognition  Overall Cognitive Status: Appears within functional limits for tasks assessed/performed Arousal/Alertness: Awake/alert Orientation Level: Oriented X4 / Intact Behavior During Session: [facility] for tasks performed    Balance  Balance Balance Assessed: No  End of Session PT - End of Session Equipment Utilized During Treatment: Gait belt;Left knee immobilizer Activity Tolerance: Patient tolerated treatment well Patient left: in chair;with call bell/phone within reach (ICE to L knee)   Felecia Shelling  PTA [facility]  Acute  Rehab Pager      [phone]

## 2012-05-19 LAB — CBC
Hemoglobin: 8.8 g/dL — ABNORMAL LOW (ref 13.0–17.0)
MCV: 91.1 fL (ref 78.0–100.0)
Platelets: 111 10*3/uL — ABNORMAL LOW (ref 150–400)
RBC: 2.81 MIL/uL — ABNORMAL LOW (ref 4.22–5.81)
WBC: 7.6 10*3/uL (ref 4.0–10.5)

## 2012-05-19 MED ORDER — MULTI-VITAMIN/MINERALS PO TABS: 1.0000 | ORAL_TABLET | Freq: Every day | ORAL | Status: DC

## 2012-05-19 MED ORDER — BISACODYL 10 MG RE SUPP: 10.0000 mg | Freq: Every day | RECTAL | Status: DC | PRN

## 2012-05-19 NOTE — Progress Notes (Signed)
Subjective: 3 Days Post-Op Procedure(s) (LRB): TOTAL KNEE ARTHROPLASTY (Left) Patient reports pain as 3 on 0-10 scale.    Objective: Vital signs in last 24 hours: Temp:  [97.5 F (36.4 C)-98.8 F (37.1 C)] 98.5 F (36.9 C) (01/25 0521) Pulse Rate:  [84-92] 88  (01/25 0521) Resp:  [14-18] 16  (01/25 0521) BP: (114-122)/(61-70) 114/70 mmHg (01/25 0521) SpO2:  [94 %-96 %] 94 % (01/25 0521)  Intake/Output from previous day: 01/24 0701 - 01/25 0700 In: 720 [P.O.:720] Out: 600 [Urine:600] Intake/Output this shift:     Basename 05/19/12 0446 05/18/12 0427 05/17/12 0445  HGB 8.8* 9.6* 11.5*    Basename 05/19/12 0446 05/18/12 0427  WBC 7.6 9.7  RBC 2.81* 3.08*  HCT 25.6* 27.9*  PLT 111* 111*    Basename 05/18/12 0427 05/17/12 0445  NA 135 137  K 3.8 4.1  CL 101 101  CO2 27 30  BUN 21 20  CREATININE 0.75 0.91  GLUCOSE 126* 142*  CALCIUM 8.2* 8.6   No results found for this basename: LABPT:2,INR:2 in the last 72 hours  Neurologically intact ABD soft Neurovascular intact Intact pulses distally Incision: dressing C/D/I No cellulitis present Ecchymosis. No DVT Assessment/Plan: 3 Days Post-Op Procedure(s) (LRB): TOTAL KNEE ARTHROPLASTY (Left) Advance diet Discharge home with home health No dizziness. Asx anemia. MVi with iron Dressing prior to D/C  Tashawn Greff C 05/19/2012, 7:28 AM

## 2012-05-19 NOTE — Progress Notes (Signed)
Physical Therapy Treatment Patient Details Name: Cameron [name] MRN: [id number] DOB: 1932/07/02 Today's Date: 05/19/2012 Time: 4098-1191 PT Time Calculation (min): 40 min  PT Assessment / Plan / Recommendation Comments on Treatment Session  Pt. tolerated well, has achieved goals and for DC    Follow Up Recommendations        Does the patient have the potential to tolerate intense rehabilitation     Barriers to Discharge        Equipment Recommendations  None recommended by PT (pt's borowwed RW is sufficient.)    Recommendations for Other Services    Frequency     Plan Discharge plan remains appropriate    Precautions / Restrictions     Pertinent Vitals/Pain     Mobility  Bed Mobility Supine to Sit: 5: Supervision Sitting - Scoot to Edge of Bed: 7: Independent Transfers Sit to Stand: 5: Supervision;From bed;From chair/3-in-1;With upper extremity assist Stand to Sit: To chair/3-in-1 Details for Transfer Assistance: cues for R le position, hand placement. Ambulation/Gait Ambulation/Gait Assistance: 5: Supervision Ambulation Distance (Feet): 300 Feet Assistive device: Rolling walker Ambulation/Gait Assistance Details: VC's on proper sequencing and upright posture  Gait Pattern: Step-through pattern;Decreased stride length Stairs: Yes Stairs Assistance: 4: Min assist Stairs Assistance Details (indicate cue type and reason): wife presentfor instruction Stair Management Technique: One rail Right;Step to pattern;Forwards;With crutches Number of Stairs: 4     Exercises Total Joint Exercises Quad Sets: AROM;Right;Seated;10 reps Heel Slides: AROM;Right;10 reps;Seated Hip ABduction/ADduction: AROM;Right;10 reps;Seated Straight Leg Raises: AAROM;Right;10 reps;Seated Long Arc Quad: AAROM;Right;10 reps;Seated Knee Flexion: AAROM;Right;10 reps;Seated   PT Diagnosis:    PT Problem List:   PT Treatment Interventions:     PT Goals Acute Rehab PT Goals Pt will go Supine/Side  to Sit: with supervision PT Goal: Supine/Side to Sit - Progress: Met Pt will go Sit to Stand: with supervision PT Goal: Sit to Stand - Progress: Met Pt will go Stand to Sit: with supervision PT Goal: Stand to Sit - Progress: Met Pt will Ambulate: 51 - 150 feet;with rolling walker;with supervision PT Goal: Ambulate - Progress: Met Pt will Go Up / Down Stairs: 3-5 stairs;with min assist PT Goal: Up/Down Stairs - Progress: Met Pt will Perform Home Exercise Program: with supervision, verbal cues required/provided PT Goal: Perform Home Exercise Program - Progress: Met  Visit Information  Last PT Received On: 05/19/12 Assistance Needed: +1    Subjective Data  Subjective: I am ready to go home   Cognition  Overall Cognitive Status: Appears within functional limits for tasks assessed/performed    Balance     End of Session PT - End of Session Equipment Utilized During Treatment: Gait belt Activity Tolerance: Patient tolerated treatment well Patient left: in chair;with call bell/phone within reach;with family/visitor present Nurse Communication: Mobility status   GP     Rada Hay 05/19/2012, 1:16 PM

## 2012-05-20 DIAGNOSIS — IMO0001 Reserved for inherently not codable concepts without codable children: Secondary | ICD-10-CM | POA: Diagnosis not present

## 2012-05-20 DIAGNOSIS — Z471 Aftercare following joint replacement surgery: Secondary | ICD-10-CM | POA: Diagnosis not present

## 2012-05-20 DIAGNOSIS — Z96659 Presence of unspecified artificial knee joint: Secondary | ICD-10-CM | POA: Diagnosis not present

## 2012-05-20 DIAGNOSIS — M159 Polyosteoarthritis, unspecified: Secondary | ICD-10-CM | POA: Diagnosis not present

## 2012-05-20 DIAGNOSIS — R262 Difficulty in walking, not elsewhere classified: Secondary | ICD-10-CM | POA: Diagnosis not present

## 2012-05-21 DIAGNOSIS — IMO0001 Reserved for inherently not codable concepts without codable children: Secondary | ICD-10-CM | POA: Diagnosis not present

## 2012-05-21 DIAGNOSIS — Z471 Aftercare following joint replacement surgery: Secondary | ICD-10-CM | POA: Diagnosis not present

## 2012-05-21 DIAGNOSIS — M159 Polyosteoarthritis, unspecified: Secondary | ICD-10-CM | POA: Diagnosis not present

## 2012-05-21 DIAGNOSIS — R262 Difficulty in walking, not elsewhere classified: Secondary | ICD-10-CM | POA: Diagnosis not present

## 2012-05-21 DIAGNOSIS — Z96659 Presence of unspecified artificial knee joint: Secondary | ICD-10-CM | POA: Diagnosis not present

## 2012-05-21 NOTE — Discharge Summary (Signed)
Physician Discharge Summary   Patient ID: Cameron [name] MRN: [id number] DOB/AGE: December 25, 1932 79 y.o.  Admit date: 05/16/2012 Discharge date: 05/19/2012  Primary Diagnosis: Osteoarthritis, left knee   Admission Diagnoses:  Past Medical History  Diagnosis Date  . BPH (benign prostatic hyperplasia)   . Osteoarthrosis and allied disorders   . Erectile dysfunction   . Other and unspecified hyperlipidemia   . Elevated PSA   . Inguinal hernia recurrent unilateral   . Gilberts syndrome   . Arthritis    Discharge Diagnoses:   Active Problems:  Postoperative anemia due to acute blood loss S/P left total knee arthroplasty  Estimated Body mass index is 23.68 kg/(m^2) as calculated from the following:   Height as of this encounter: 5\' 10" (1.778 m).   Weight as of this encounter: 165 lb(74.844 kg).  Classification of overweight in adults according to BMI (WHO, 1998)   Procedure:  Procedure(s) (LRB): TOTAL KNEE ARTHROPLASTY (Left)   Consults: None  HPI: Cameron [name], 79 y.o. male, has a history of pain and functional disability in the left knee due to arthritis and has failed non-surgical conservative treatments for greater than 12 weeks to includeNSAID's and/or analgesics, corticosteriod injections, flexibility and strengthening excercises and activity modification. Onset of symptoms was gradual, starting 1 years ago with gradually worsening course since that time. The patient noted prior procedures on the knee to include arthroscopy on the left knee(s). Patient currently rates pain in the left knee(s) at 7 out of 10 with activity. Patient has night pain, worsening of pain with activity and weight bearing, pain that interferes with activities of daily living, pain with passive range of motion, crepitus and joint swelling. Patient has evidence of subchondral sclerosis, periarticular osteophytes and joint space narrowing by imaging studies. There is no active infection.   Laboratory  Data: Admission on 05/16/2012, Discharged on 05/19/2012  Component Date Value Range Status  . ABO/RH(D) 05/16/2012 O POS   Final  . Antibody Screen 05/16/2012 NEG   Final  . Sample Expiration 05/16/2012 05/19/2012   Final  . ABO/RH(D) 05/16/2012 O POS   Final  . WBC 05/17/2012 12.8* 4.0 - 10.5 K/uL Final  . RBC 05/17/2012 3.76* 4.22 - 5.81 MIL/uL Final  . Hemoglobin 05/17/2012 11.5* 13.0 - 17.0 g/dL Final  . HCT 56/21/3086 34.1* 39.0 - 52.0 % Final  . MCV 05/17/2012 90.7  78.0 - 100.0 fL Final  . MCH 05/17/2012 30.6  26.0 - 34.0 pg Final  . MCHC 05/17/2012 33.7  30.0 - 36.0 g/dL Final  . RDW 57/84/6962 13.2  11.5 - 15.5 % Final  . Platelets 05/17/2012 161  150 - 400 K/uL Final  . Sodium 05/17/2012 137  135 - 145 mEq/L Final  . Potassium 05/17/2012 4.1  3.5 - 5.1 mEq/L Final  . Chloride 05/17/2012 101  96 - 112 mEq/L Final  . CO2 05/17/2012 30  19 - 32 mEq/L Final  . Glucose, Bld 05/17/2012 142* 70 - 99 mg/dL Final  . BUN 95/28/4132 20  6 - 23 mg/dL Final  . Creatinine, Ser 05/17/2012 0.91  0.50 - 1.35 mg/dL Final  . Calcium 44/04/270 8.6  8.4 - 10.5 mg/dL Final  . GFR calc non Af Amer 05/17/2012 80* >90 mL/min Final  . GFR calc Af Amer 05/17/2012 >90  >90 mL/min Final   Comment:  The eGFR has been calculated                          using the CKD EPI equation.                          This calculation has not been                          validated in all clinical                          situations.                          eGFR's persistently                          <90 mL/min signify                          possible Chronic Kidney Disease.  . WBC 05/18/2012 9.7  4.0 - 10.5 K/uL Final  . RBC 05/18/2012 3.08* 4.22 - 5.81 MIL/uL Final  . Hemoglobin 05/18/2012 9.6* 13.0 - 17.0 g/dL Final  . HCT 16/01/9603 27.9* 39.0 - 52.0 % Final  . MCV 05/18/2012 90.6  78.0 - 100.0 fL Final  . MCH 05/18/2012 31.2  26.0 - 34.0 pg Final  . MCHC 05/18/2012 34.4   30.0 - 36.0 g/dL Final  . RDW 54/12/8117 13.4  11.5 - 15.5 % Final  . Platelets 05/18/2012 111* 150 - 400 K/uL Final   Comment: DELTA CHECK NOTED                          REPEATED TO VERIFY                          SPECIMEN CHECKED FOR CLOTS                          PLATELET COUNT CONFIRMED BY SMEAR  . Sodium 05/18/2012 135  135 - 145 mEq/L Final  . Potassium 05/18/2012 3.8  3.5 - 5.1 mEq/L Final  . Chloride 05/18/2012 101  96 - 112 mEq/L Final  . CO2 05/18/2012 27  19 - 32 mEq/L Final  . Glucose, Bld 05/18/2012 126* 70 - 99 mg/dL Final  . BUN 14/78/2956 21  6 - 23 mg/dL Final  . Creatinine, Ser 05/18/2012 0.75  0.50 - 1.35 mg/dL Final  . Calcium 21/30/8657 8.2* 8.4 - 10.5 mg/dL Final  . GFR calc non Af Amer 05/18/2012 86* >90 mL/min Final  . GFR calc Af Amer 05/18/2012 >90  >90 mL/min Final   Comment:                                 The eGFR has been calculated                          using the CKD EPI equation.  This calculation has not been                          validated in all clinical                          situations.                          eGFR's persistently                          <90 mL/min signify                          possible Chronic Kidney Disease.  . WBC 05/19/2012 7.6  4.0 - 10.5 K/uL Final  . RBC 05/19/2012 2.81* 4.22 - 5.81 MIL/uL Final  . Hemoglobin 05/19/2012 8.8* 13.0 - 17.0 g/dL Final  . HCT 16/01/9603 25.6* 39.0 - 52.0 % Final  . MCV 05/19/2012 91.1  78.0 - 100.0 fL Final  . MCH 05/19/2012 31.3  26.0 - 34.0 pg Final  . MCHC 05/19/2012 34.4  30.0 - 36.0 g/dL Final  . RDW 54/12/8117 13.4  11.5 - 15.5 % Final  . Platelets 05/19/2012 111* 150 - 400 K/uL Final   CONSISTENT WITH PREVIOUS RESULT  Hospital Outpatient Visit on 05/08/2012  Component Date Value Range Status  . WBC 05/08/2012 7.6  4.0 - 10.5 K/uL Final  . RBC 05/08/2012 5.36  4.22 - 5.81 MIL/uL Final  . Hemoglobin 05/08/2012 16.8  13.0 - 17.0 g/dL Final  . HCT  14/78/2956 48.8  39.0 - 52.0 % Final  . MCV 05/08/2012 91.0  78.0 - 100.0 fL Final  . MCH 05/08/2012 31.3  26.0 - 34.0 pg Final  . MCHC 05/08/2012 34.4  30.0 - 36.0 g/dL Final  . RDW 21/30/8657 12.8  11.5 - 15.5 % Final  . Platelets 05/08/2012 197  150 - 400 K/uL Final  . MRSA, PCR 05/08/2012 NEGATIVE  NEGATIVE Final  . Staphylococcus aureus 05/08/2012 NEGATIVE  NEGATIVE Final   Comment:                                 The Xpert SA Assay (FDA                          approved for NASAL specimens                          in patients over 29 years of age),                          is one component of                          a comprehensive surveillance                          program.  Test performance has                          been validated by [employer] [facility] for patients greater                          than or equal to 65 year old.                          It is not intended                          to diagnose infection nor to                          guide or monitor treatment.  Marland Kitchen aPTT 05/08/2012 28  24 - 37 seconds Final  . Sodium 05/08/2012 137  135 - 145 mEq/L Final  . Potassium 05/08/2012 4.3  3.5 - 5.1 mEq/L Final  . Chloride 05/08/2012 99  96 - 112 mEq/L Final  . CO2 05/08/2012 30  19 - 32 mEq/L Final  . Glucose, Bld 05/08/2012 91  70 - 99 mg/dL Final  . BUN 78/29/5621 20  6 - 23 mg/dL Final  . Creatinine, Ser 05/08/2012 0.87  0.50 - 1.35 mg/dL Final  . Calcium 30/86/5784 10.0  8.4 - 10.5 mg/dL Final  . Total Protein 05/08/2012 7.0  6.0 - 8.3 g/dL Final  . Albumin 69/62/9528 4.1  3.5 - 5.2 g/dL Final  . AST 41/32/4401 15  0 - 37 U/L Final  . ALT 05/08/2012 15  0 - 53 U/L Final  . Alkaline Phosphatase 05/08/2012 71  39 - 117 U/L Final  . Total Bilirubin 05/08/2012 1.4* 0.3 - 1.2 mg/dL Final  . GFR calc non Af Amer 05/08/2012 81* >90 mL/min Final  . GFR calc Af Amer 05/08/2012 >90  >90 mL/min Final   Comment:                                 The  eGFR has been calculated                          using the CKD EPI equation.                          This calculation has not been                          validated in all clinical                          situations.                          eGFR's persistently                          <90 mL/min signify                          possible Chronic Kidney Disease.  Marland Kitchen Prothrombin Time 05/08/2012 13.4  11.6 - 15.2 seconds Final  . INR 05/08/2012 1.03  0.00 - 1.49 Final  . Color, Urine 05/08/2012 YELLOW  YELLOW Final  . APPearance 05/08/2012 CLEAR  CLEAR Final  . Specific Gravity,  Urine 05/08/2012 1.025  1.005 - 1.030 Final  . pH 05/08/2012 6.5  5.0 - 8.0 Final  . Glucose, UA 05/08/2012 NEGATIVE  NEGATIVE mg/dL Final  . Hgb urine dipstick 05/08/2012 NEGATIVE  NEGATIVE Final  . Bilirubin Urine 05/08/2012 NEGATIVE  NEGATIVE Final  . Ketones, ur 05/08/2012 TRACE* NEGATIVE mg/dL Final  . Protein, ur 16/01/9603 NEGATIVE  NEGATIVE mg/dL Final  . Urobilinogen, UA 05/08/2012 1.0  0.0 - 1.0 mg/dL Final  . Nitrite 54/12/8117 NEGATIVE  NEGATIVE Final  . Leukocytes, UA 05/08/2012 NEGATIVE  NEGATIVE Final   MICROSCOPIC NOT DONE ON URINES WITH NEGATIVE PROTEIN, BLOOD, LEUKOCYTES, NITRITE, OR GLUCOSE <1000 mg/dL.     X-Rays:Dg Chest 2 View  05/08/2012  *RADIOLOGY REPORT*  Clinical Data: Preop evaluation for total knee replacement  CHEST - 2 VIEW  Comparison: None.  Findings: Normal heart size and vascularity.  Negative for CHF or pneumonia.  No effusion or pneumothorax.  Lower chest nipple shadows noted.  Trachea is midline.  IMPRESSION: No acute chest process   Original Report Authenticated By: Judie Petit. Miles Costain, M.D.    Dg Knee Left Port  05/16/2012  *RADIOLOGY REPORT*  Clinical Data: Left total knee arthroplasty.  PORTABLE LEFT KNEE - 1-2 VIEW  Comparison: None  Findings: The tibial and femoral components are well seated.  No complicating features identified.  IMPRESSION: Well seated components of a total knee  arthroplasty.   Original Report Authenticated By: Rudie Meyer, M.D.     EKG: Orders placed during the hospital encounter of 05/16/12  . EKG     Hospital Course: Cameron [name] is a 78 y.o. who was admitted to [facility]. They were brought to the operating room on 05/16/2012 and underwent Procedure(s): TOTAL KNEE ARTHROPLASTY.  Patient tolerated the procedure well and was later transferred to the recovery room and then to the orthopaedic floor for postoperative care.  They were given PO and IV analgesics for pain control following their surgery.  They were given 24 hours of postoperative antibiotics of  Anti-infectives     Start     Dose/Rate Route Frequency Ordered Stop   05/16/12 1600   ceFAZolin (ANCEF) IVPB 1 g/50 mL premix        1 g 100 mL/hr over 30 Minutes Intravenous Every 6 hours 05/16/12 1248 05/16/12 2254   05/16/12 0902   polymyxin B 500,000 Units, bacitracin 50,000 Units in sodium chloride irrigation 0.9 % 500 mL irrigation  Status:  Discontinued          As needed 05/16/12 0902 05/16/12 1051   05/16/12 0651   ceFAZolin (ANCEF) IVPB 2 g/50 mL premix        2 g 100 mL/hr over 30 Minutes Intravenous 60 min pre-op 05/16/12 0651 05/16/12 0836         and started on DVT prophylaxis in the form of Xarelto.   PT and OT were ordered for total joint protocol.  Discharge planning consulted to help with postop disposition and equipment needs.  Patient had a rough night on the evening of surgery due to pain and poor sleep but started to get up OOB with therapy on day one. Hemovac drain was pulled without difficulty.  Continued to work with therapy into day two.  Dressing was changed on day two and the incision was clean and dry.  By day three, the patient had progressed with therapy and meeting their goals.  Incision was healing well.  Patient was seen in rounds and was ready  to go home.   Discharge Medications: Prior to Admission medications   Medication Sig Start Date End  Date Taking? Authorizing Provider  simvastatin (ZOCOR) 40 MG tablet Take 20-40 mg by mouth at bedtime. 1/2 alternating with 1/2 q o d   Yes Historical Provider, MD  Tamsulosin HCl (FLOMAX) 0.4 MG CAPS Take 0.4 mg by mouth daily after supper.   Yes Historical Provider, MD  ferrous sulfate 325 (65 FE) MG tablet Take 1 tablet (325 mg total) by mouth 3 (three) times daily after meals. 05/18/12   Susana Duell Tamala Ser, PA  methocarbamol (ROBAXIN) 500 MG tablet Take 1 tablet (500 mg total) by mouth every 6 (six) hours as needed. 05/18/12   Loreta Blouch Tamala Ser, PA  Multiple Vitamins-Minerals (MULTIVITAMIN WITH MINERALS) tablet Take 1 tablet by mouth daily. 05/19/12   Javier Docker, MD  oxyCODONE-acetaminophen (PERCOCET/ROXICET) 5-325 MG per tablet Take 1-2 tablets by mouth every 4 (four) hours as needed (Q4-6 hours PRN). 05/18/12   Demontrae Gilbert Tamala Ser, PA  rivaroxaban (XARELTO) 10 MG TABS tablet Take 1 tablet (10 mg total) by mouth daily with breakfast. 05/18/12   Makendra Vigeant Tamala Ser, PA    Diet: Cardiac diet Activity:WBAT Follow-up:in 2 weeks Disposition - Home Discharged Condition: good   Discharge Orders    Future Orders Please Complete By Expires   Diet - low sodium heart healthy      Diet - low sodium heart healthy      Call MD / Call 911      Comments:   If you experience chest pain or shortness of breath, CALL 911 and be transported to the hospital emergency room.  If you develope a fever above 101 F, pus (white drainage) or increased drainage or redness at the wound, or calf pain, call your surgeon's office.   Constipation Prevention      Comments:   Drink plenty of fluids.  Prune juice may be helpful.  You may use a stool softener, such as Colace (over the counter) 100 mg twice a day.  Use MiraLax (over the counter) for constipation as needed.   Increase activity slowly as tolerated      Discharge instructions      Comments:   Walk with your walker. Weight bearing as  instructed. Home Health Agency will follow you at home for your therapy Change your dressing daily. Shower only, no tub bath. Call if any temperatures greater than 101 or any wound complications: [phone] during the day and ask for Dr. Jeannetta Ellis nurse, [facility]. Follow up in the office in 2 weeks   Driving restrictions      Comments:   No driving for 2 weeks   Do not put a pillow under the knee. Place it under the heel.      Call MD / Call 911      Comments:   If you experience chest pain or shortness of breath, CALL 911 and be transported to the hospital emergency room.  If you develope a fever above 101 F, pus (white drainage) or increased drainage or redness at the wound, or calf pain, call your surgeon's office.   Constipation Prevention      Comments:   Drink plenty of fluids.  Prune juice may be helpful.  You may use a stool softener, such as Colace (over the counter) 100 mg twice a day.  Use MiraLax (over the counter) for constipation as needed.   Increase activity slowly as tolerated  Medication List     As of 05/21/2012  8:47 AM    STOP taking these medications         aspirin 81 MG EC tablet      cholecalciferol 1000 UNITS tablet   Commonly known as: VITAMIN D      Coenzyme Q10 200 MG capsule      Flax Seed Oil 1000 MG Caps      GLUCOSAMINE-CHONDROIT-MSM-C-MN PO      ibuprofen 200 MG tablet   Commonly known as: ADVIL,MOTRIN      TAKE these medications         ferrous sulfate 325 (65 FE) MG tablet   Take 1 tablet (325 mg total) by mouth 3 (three) times daily after meals.      methocarbamol 500 MG tablet   Commonly known as: ROBAXIN   Take 1 tablet (500 mg total) by mouth every 6 (six) hours as needed.      multivitamin with minerals tablet   Take 1 tablet by mouth daily.      oxyCODONE-acetaminophen 5-325 MG per tablet   Commonly known as: PERCOCET/ROXICET   Take 1-2 tablets by mouth every 4 (four) hours as needed (Q4-6 hours PRN).       rivaroxaban 10 MG Tabs tablet   Commonly known as: XARELTO   Take 1 tablet (10 mg total) by mouth daily with breakfast.      simvastatin 40 MG tablet   Commonly known as: ZOCOR   Take 20-40 mg by mouth at bedtime. 1/2 alternating with 1/2 q o d      Tamsulosin HCl 0.4 MG Caps   Commonly known as: FLOMAX   Take 0.4 mg by mouth daily after supper.           Follow-up Information    Follow up with GIOFFRE,RONALD A, MD. Schedule an appointment as soon as possible for a visit in 2 weeks.   Contact information:   [address], [place] [address] [place] [address] [phone]          Signed: Kerby Nora 05/21/2012, 8:47 AM

## 2012-05-22 ENCOUNTER — Other Ambulatory Visit: Payer: Self-pay | Admitting: Surgical

## 2012-05-22 DIAGNOSIS — M159 Polyosteoarthritis, unspecified: Secondary | ICD-10-CM | POA: Diagnosis not present

## 2012-05-22 DIAGNOSIS — Z471 Aftercare following joint replacement surgery: Secondary | ICD-10-CM | POA: Diagnosis not present

## 2012-05-22 DIAGNOSIS — IMO0001 Reserved for inherently not codable concepts without codable children: Secondary | ICD-10-CM | POA: Diagnosis not present

## 2012-05-22 DIAGNOSIS — Z96659 Presence of unspecified artificial knee joint: Secondary | ICD-10-CM | POA: Diagnosis not present

## 2012-05-22 DIAGNOSIS — R262 Difficulty in walking, not elsewhere classified: Secondary | ICD-10-CM | POA: Diagnosis not present

## 2012-05-24 DIAGNOSIS — M159 Polyosteoarthritis, unspecified: Secondary | ICD-10-CM | POA: Diagnosis not present

## 2012-05-24 DIAGNOSIS — IMO0001 Reserved for inherently not codable concepts without codable children: Secondary | ICD-10-CM | POA: Diagnosis not present

## 2012-05-24 DIAGNOSIS — Z96659 Presence of unspecified artificial knee joint: Secondary | ICD-10-CM | POA: Diagnosis not present

## 2012-05-24 DIAGNOSIS — Z471 Aftercare following joint replacement surgery: Secondary | ICD-10-CM | POA: Diagnosis not present

## 2012-05-24 DIAGNOSIS — R262 Difficulty in walking, not elsewhere classified: Secondary | ICD-10-CM | POA: Diagnosis not present

## 2012-05-25 DIAGNOSIS — IMO0001 Reserved for inherently not codable concepts without codable children: Secondary | ICD-10-CM | POA: Diagnosis not present

## 2012-05-25 DIAGNOSIS — M159 Polyosteoarthritis, unspecified: Secondary | ICD-10-CM | POA: Diagnosis not present

## 2012-05-25 DIAGNOSIS — Z96659 Presence of unspecified artificial knee joint: Secondary | ICD-10-CM | POA: Diagnosis not present

## 2012-05-25 DIAGNOSIS — R262 Difficulty in walking, not elsewhere classified: Secondary | ICD-10-CM | POA: Diagnosis not present

## 2012-05-25 DIAGNOSIS — Z471 Aftercare following joint replacement surgery: Secondary | ICD-10-CM | POA: Diagnosis not present

## 2012-05-28 DIAGNOSIS — IMO0001 Reserved for inherently not codable concepts without codable children: Secondary | ICD-10-CM | POA: Diagnosis not present

## 2012-05-28 DIAGNOSIS — R262 Difficulty in walking, not elsewhere classified: Secondary | ICD-10-CM | POA: Diagnosis not present

## 2012-05-28 DIAGNOSIS — Z96659 Presence of unspecified artificial knee joint: Secondary | ICD-10-CM | POA: Diagnosis not present

## 2012-05-28 DIAGNOSIS — Z471 Aftercare following joint replacement surgery: Secondary | ICD-10-CM | POA: Diagnosis not present

## 2012-05-28 DIAGNOSIS — M159 Polyosteoarthritis, unspecified: Secondary | ICD-10-CM | POA: Diagnosis not present

## 2012-05-30 DIAGNOSIS — Z471 Aftercare following joint replacement surgery: Secondary | ICD-10-CM | POA: Diagnosis not present

## 2012-05-30 DIAGNOSIS — M159 Polyosteoarthritis, unspecified: Secondary | ICD-10-CM | POA: Diagnosis not present

## 2012-05-30 DIAGNOSIS — IMO0001 Reserved for inherently not codable concepts without codable children: Secondary | ICD-10-CM | POA: Diagnosis not present

## 2012-05-30 DIAGNOSIS — R262 Difficulty in walking, not elsewhere classified: Secondary | ICD-10-CM | POA: Diagnosis not present

## 2012-05-30 DIAGNOSIS — Z96659 Presence of unspecified artificial knee joint: Secondary | ICD-10-CM | POA: Diagnosis not present

## 2012-06-01 DIAGNOSIS — M159 Polyosteoarthritis, unspecified: Secondary | ICD-10-CM | POA: Diagnosis not present

## 2012-06-01 DIAGNOSIS — Z471 Aftercare following joint replacement surgery: Secondary | ICD-10-CM | POA: Diagnosis not present

## 2012-06-01 DIAGNOSIS — IMO0001 Reserved for inherently not codable concepts without codable children: Secondary | ICD-10-CM | POA: Diagnosis not present

## 2012-06-01 DIAGNOSIS — R262 Difficulty in walking, not elsewhere classified: Secondary | ICD-10-CM | POA: Diagnosis not present

## 2012-06-01 DIAGNOSIS — Z96659 Presence of unspecified artificial knee joint: Secondary | ICD-10-CM | POA: Diagnosis not present

## 2012-06-04 ENCOUNTER — Ambulatory Visit: Payer: Medicare Other | Attending: Orthopedic Surgery | Admitting: Physical Therapy

## 2012-06-04 DIAGNOSIS — R5381 Other malaise: Secondary | ICD-10-CM | POA: Insufficient documentation

## 2012-06-04 DIAGNOSIS — M25569 Pain in unspecified knee: Secondary | ICD-10-CM | POA: Insufficient documentation

## 2012-06-04 DIAGNOSIS — IMO0001 Reserved for inherently not codable concepts without codable children: Secondary | ICD-10-CM | POA: Diagnosis not present

## 2012-06-04 DIAGNOSIS — M25669 Stiffness of unspecified knee, not elsewhere classified: Secondary | ICD-10-CM | POA: Insufficient documentation

## 2012-06-04 DIAGNOSIS — Z96659 Presence of unspecified artificial knee joint: Secondary | ICD-10-CM | POA: Diagnosis not present

## 2012-06-05 ENCOUNTER — Ambulatory Visit: Payer: Medicare Other | Admitting: Physical Therapy

## 2012-06-05 DIAGNOSIS — IMO0001 Reserved for inherently not codable concepts without codable children: Secondary | ICD-10-CM | POA: Diagnosis not present

## 2012-06-05 DIAGNOSIS — M25569 Pain in unspecified knee: Secondary | ICD-10-CM | POA: Diagnosis not present

## 2012-06-05 DIAGNOSIS — Z96659 Presence of unspecified artificial knee joint: Secondary | ICD-10-CM | POA: Diagnosis not present

## 2012-06-05 DIAGNOSIS — R5381 Other malaise: Secondary | ICD-10-CM | POA: Diagnosis not present

## 2012-06-05 DIAGNOSIS — M25669 Stiffness of unspecified knee, not elsewhere classified: Secondary | ICD-10-CM | POA: Diagnosis not present

## 2012-06-08 ENCOUNTER — Encounter: Payer: PRIVATE HEALTH INSURANCE | Admitting: *Deleted

## 2012-06-11 ENCOUNTER — Ambulatory Visit: Payer: Medicare Other | Admitting: *Deleted

## 2012-06-11 DIAGNOSIS — R5381 Other malaise: Secondary | ICD-10-CM | POA: Diagnosis not present

## 2012-06-11 DIAGNOSIS — M25569 Pain in unspecified knee: Secondary | ICD-10-CM | POA: Diagnosis not present

## 2012-06-11 DIAGNOSIS — IMO0001 Reserved for inherently not codable concepts without codable children: Secondary | ICD-10-CM | POA: Diagnosis not present

## 2012-06-11 DIAGNOSIS — M25669 Stiffness of unspecified knee, not elsewhere classified: Secondary | ICD-10-CM | POA: Diagnosis not present

## 2012-06-11 DIAGNOSIS — Z96659 Presence of unspecified artificial knee joint: Secondary | ICD-10-CM | POA: Diagnosis not present

## 2012-06-12 DIAGNOSIS — Z96659 Presence of unspecified artificial knee joint: Secondary | ICD-10-CM | POA: Diagnosis not present

## 2012-06-13 ENCOUNTER — Ambulatory Visit: Payer: Medicare Other | Admitting: Physical Therapy

## 2012-06-15 ENCOUNTER — Ambulatory Visit: Payer: Medicare Other | Admitting: Physical Therapy

## 2012-06-15 DIAGNOSIS — M25569 Pain in unspecified knee: Secondary | ICD-10-CM | POA: Diagnosis not present

## 2012-06-15 DIAGNOSIS — R5381 Other malaise: Secondary | ICD-10-CM | POA: Diagnosis not present

## 2012-06-15 DIAGNOSIS — IMO0001 Reserved for inherently not codable concepts without codable children: Secondary | ICD-10-CM | POA: Diagnosis not present

## 2012-06-15 DIAGNOSIS — Z96659 Presence of unspecified artificial knee joint: Secondary | ICD-10-CM | POA: Diagnosis not present

## 2012-06-15 DIAGNOSIS — M25669 Stiffness of unspecified knee, not elsewhere classified: Secondary | ICD-10-CM | POA: Diagnosis not present

## 2012-06-18 ENCOUNTER — Ambulatory Visit: Payer: Medicare Other | Admitting: Physical Therapy

## 2012-06-18 DIAGNOSIS — M25569 Pain in unspecified knee: Secondary | ICD-10-CM | POA: Diagnosis not present

## 2012-06-18 DIAGNOSIS — IMO0001 Reserved for inherently not codable concepts without codable children: Secondary | ICD-10-CM | POA: Diagnosis not present

## 2012-06-18 DIAGNOSIS — R5381 Other malaise: Secondary | ICD-10-CM | POA: Diagnosis not present

## 2012-06-18 DIAGNOSIS — Z96659 Presence of unspecified artificial knee joint: Secondary | ICD-10-CM | POA: Diagnosis not present

## 2012-06-18 DIAGNOSIS — M25669 Stiffness of unspecified knee, not elsewhere classified: Secondary | ICD-10-CM | POA: Diagnosis not present

## 2012-06-20 ENCOUNTER — Ambulatory Visit: Payer: Medicare Other | Admitting: Physical Therapy

## 2012-06-20 DIAGNOSIS — IMO0001 Reserved for inherently not codable concepts without codable children: Secondary | ICD-10-CM | POA: Diagnosis not present

## 2012-06-20 DIAGNOSIS — Z96659 Presence of unspecified artificial knee joint: Secondary | ICD-10-CM | POA: Diagnosis not present

## 2012-06-20 DIAGNOSIS — M25669 Stiffness of unspecified knee, not elsewhere classified: Secondary | ICD-10-CM | POA: Diagnosis not present

## 2012-06-20 DIAGNOSIS — M25569 Pain in unspecified knee: Secondary | ICD-10-CM | POA: Diagnosis not present

## 2012-06-20 DIAGNOSIS — R5381 Other malaise: Secondary | ICD-10-CM | POA: Diagnosis not present

## 2012-06-22 ENCOUNTER — Ambulatory Visit: Payer: Medicare Other | Admitting: Physical Therapy

## 2012-06-22 DIAGNOSIS — Z96659 Presence of unspecified artificial knee joint: Secondary | ICD-10-CM | POA: Diagnosis not present

## 2012-06-22 DIAGNOSIS — M25569 Pain in unspecified knee: Secondary | ICD-10-CM | POA: Diagnosis not present

## 2012-06-22 DIAGNOSIS — IMO0001 Reserved for inherently not codable concepts without codable children: Secondary | ICD-10-CM | POA: Diagnosis not present

## 2012-06-22 DIAGNOSIS — R5381 Other malaise: Secondary | ICD-10-CM | POA: Diagnosis not present

## 2012-06-22 DIAGNOSIS — M25669 Stiffness of unspecified knee, not elsewhere classified: Secondary | ICD-10-CM | POA: Diagnosis not present

## 2012-06-25 ENCOUNTER — Ambulatory Visit: Payer: Medicare Other | Attending: Orthopedic Surgery | Admitting: *Deleted

## 2012-06-25 DIAGNOSIS — R5381 Other malaise: Secondary | ICD-10-CM | POA: Insufficient documentation

## 2012-06-25 DIAGNOSIS — M25669 Stiffness of unspecified knee, not elsewhere classified: Secondary | ICD-10-CM | POA: Diagnosis not present

## 2012-06-25 DIAGNOSIS — IMO0001 Reserved for inherently not codable concepts without codable children: Secondary | ICD-10-CM | POA: Insufficient documentation

## 2012-06-25 DIAGNOSIS — Z96659 Presence of unspecified artificial knee joint: Secondary | ICD-10-CM | POA: Insufficient documentation

## 2012-06-25 DIAGNOSIS — M25569 Pain in unspecified knee: Secondary | ICD-10-CM | POA: Insufficient documentation

## 2012-06-27 ENCOUNTER — Ambulatory Visit: Payer: Medicare Other | Admitting: *Deleted

## 2012-06-29 ENCOUNTER — Encounter: Payer: PRIVATE HEALTH INSURANCE | Admitting: *Deleted

## 2012-07-02 ENCOUNTER — Ambulatory Visit: Payer: Medicare Other | Admitting: Physical Therapy

## 2012-07-04 ENCOUNTER — Ambulatory Visit: Payer: Medicare Other | Admitting: Physical Therapy

## 2012-07-06 ENCOUNTER — Ambulatory Visit: Payer: Medicare Other | Admitting: Physical Therapy

## 2012-07-09 ENCOUNTER — Ambulatory Visit: Payer: Medicare Other | Admitting: Physical Therapy

## 2012-07-09 DIAGNOSIS — L570 Actinic keratosis: Secondary | ICD-10-CM | POA: Diagnosis not present

## 2012-07-09 DIAGNOSIS — L851 Acquired keratosis [keratoderma] palmaris et plantaris: Secondary | ICD-10-CM | POA: Diagnosis not present

## 2012-07-09 DIAGNOSIS — C44519 Basal cell carcinoma of skin of other part of trunk: Secondary | ICD-10-CM | POA: Diagnosis not present

## 2012-07-09 DIAGNOSIS — L910 Hypertrophic scar: Secondary | ICD-10-CM | POA: Diagnosis not present

## 2012-07-09 DIAGNOSIS — D485 Neoplasm of uncertain behavior of skin: Secondary | ICD-10-CM | POA: Diagnosis not present

## 2012-07-11 ENCOUNTER — Ambulatory Visit: Payer: Medicare Other | Admitting: Physical Therapy

## 2012-07-13 ENCOUNTER — Ambulatory Visit: Payer: Medicare Other | Admitting: Physical Therapy

## 2012-07-16 ENCOUNTER — Ambulatory Visit: Payer: Medicare Other | Admitting: Physical Therapy

## 2012-07-18 ENCOUNTER — Ambulatory Visit: Payer: Medicare Other | Admitting: Physical Therapy

## 2012-07-19 DIAGNOSIS — Z96659 Presence of unspecified artificial knee joint: Secondary | ICD-10-CM | POA: Diagnosis not present

## 2012-07-20 ENCOUNTER — Ambulatory Visit: Payer: Medicare Other | Admitting: Physical Therapy

## 2012-07-23 ENCOUNTER — Ambulatory Visit: Payer: Medicare Other | Admitting: Physical Therapy

## 2012-07-25 ENCOUNTER — Ambulatory Visit: Payer: Medicare Other | Attending: Orthopedic Surgery | Admitting: Physical Therapy

## 2012-07-25 DIAGNOSIS — Z96659 Presence of unspecified artificial knee joint: Secondary | ICD-10-CM | POA: Insufficient documentation

## 2012-07-25 DIAGNOSIS — IMO0001 Reserved for inherently not codable concepts without codable children: Secondary | ICD-10-CM | POA: Insufficient documentation

## 2012-07-25 DIAGNOSIS — R5381 Other malaise: Secondary | ICD-10-CM | POA: Diagnosis not present

## 2012-07-25 DIAGNOSIS — M25569 Pain in unspecified knee: Secondary | ICD-10-CM | POA: Diagnosis not present

## 2012-07-25 DIAGNOSIS — M25669 Stiffness of unspecified knee, not elsewhere classified: Secondary | ICD-10-CM | POA: Diagnosis not present

## 2012-07-27 ENCOUNTER — Ambulatory Visit: Payer: Medicare Other | Admitting: *Deleted

## 2012-07-27 DIAGNOSIS — IMO0001 Reserved for inherently not codable concepts without codable children: Secondary | ICD-10-CM | POA: Diagnosis not present

## 2012-07-27 DIAGNOSIS — M25569 Pain in unspecified knee: Secondary | ICD-10-CM | POA: Diagnosis not present

## 2012-07-27 DIAGNOSIS — M25669 Stiffness of unspecified knee, not elsewhere classified: Secondary | ICD-10-CM | POA: Diagnosis not present

## 2012-07-27 DIAGNOSIS — Z96659 Presence of unspecified artificial knee joint: Secondary | ICD-10-CM | POA: Diagnosis not present

## 2012-07-27 DIAGNOSIS — R5381 Other malaise: Secondary | ICD-10-CM | POA: Diagnosis not present

## 2012-07-30 ENCOUNTER — Ambulatory Visit: Payer: Medicare Other | Admitting: Physical Therapy

## 2012-07-30 DIAGNOSIS — Z96659 Presence of unspecified artificial knee joint: Secondary | ICD-10-CM | POA: Diagnosis not present

## 2012-07-30 DIAGNOSIS — M25569 Pain in unspecified knee: Secondary | ICD-10-CM | POA: Diagnosis not present

## 2012-07-30 DIAGNOSIS — M25669 Stiffness of unspecified knee, not elsewhere classified: Secondary | ICD-10-CM | POA: Diagnosis not present

## 2012-07-30 DIAGNOSIS — R5381 Other malaise: Secondary | ICD-10-CM | POA: Diagnosis not present

## 2012-07-30 DIAGNOSIS — IMO0001 Reserved for inherently not codable concepts without codable children: Secondary | ICD-10-CM | POA: Diagnosis not present

## 2012-08-02 ENCOUNTER — Ambulatory Visit: Payer: Medicare Other | Admitting: Physical Therapy

## 2012-08-02 DIAGNOSIS — R5381 Other malaise: Secondary | ICD-10-CM | POA: Diagnosis not present

## 2012-08-02 DIAGNOSIS — M25569 Pain in unspecified knee: Secondary | ICD-10-CM | POA: Diagnosis not present

## 2012-08-02 DIAGNOSIS — Z96659 Presence of unspecified artificial knee joint: Secondary | ICD-10-CM | POA: Diagnosis not present

## 2012-08-02 DIAGNOSIS — M25669 Stiffness of unspecified knee, not elsewhere classified: Secondary | ICD-10-CM | POA: Diagnosis not present

## 2012-08-02 DIAGNOSIS — IMO0001 Reserved for inherently not codable concepts without codable children: Secondary | ICD-10-CM | POA: Diagnosis not present

## 2012-09-03 DIAGNOSIS — Z85828 Personal history of other malignant neoplasm of skin: Secondary | ICD-10-CM | POA: Diagnosis not present

## 2012-09-03 DIAGNOSIS — L910 Hypertrophic scar: Secondary | ICD-10-CM | POA: Diagnosis not present

## 2012-09-04 DIAGNOSIS — H2510 Age-related nuclear cataract, unspecified eye: Secondary | ICD-10-CM | POA: Diagnosis not present

## 2012-09-04 DIAGNOSIS — H04129 Dry eye syndrome of unspecified lacrimal gland: Secondary | ICD-10-CM | POA: Diagnosis not present

## 2012-09-11 ENCOUNTER — Other Ambulatory Visit (INDEPENDENT_AMBULATORY_CARE_PROVIDER_SITE_OTHER): Payer: Medicare Other

## 2012-09-11 DIAGNOSIS — R5381 Other malaise: Secondary | ICD-10-CM

## 2012-09-11 DIAGNOSIS — C61 Malignant neoplasm of prostate: Secondary | ICD-10-CM

## 2012-09-11 DIAGNOSIS — I10 Essential (primary) hypertension: Secondary | ICD-10-CM

## 2012-09-11 DIAGNOSIS — R5383 Other fatigue: Secondary | ICD-10-CM

## 2012-09-11 DIAGNOSIS — E785 Hyperlipidemia, unspecified: Secondary | ICD-10-CM | POA: Diagnosis not present

## 2012-09-11 DIAGNOSIS — E559 Vitamin D deficiency, unspecified: Secondary | ICD-10-CM | POA: Diagnosis not present

## 2012-09-11 LAB — BASIC METABOLIC PANEL WITH GFR
BUN: 18 mg/dL (ref 6–23)
Calcium: 9.6 mg/dL (ref 8.4–10.5)
GFR, Est African American: >89 mL/min
Glucose, Bld: 97 mg/dL (ref 70–99)
Sodium: 140 mEq/L (ref 135–145)

## 2012-09-11 LAB — POCT CBC
HCT, POC: 49.7 % (ref 43.5–53.7)
Lymph, poc: 1.5 (ref 0.6–3.4)
MCHC: 33.7 g/dL (ref 31.8–35.4)
MCV: 88.3 fL (ref 80–97)
POC Granulocyte: 4.6 (ref 2–6.9)
RDW, POC: 14 %
WBC: 6.3 10*3/uL (ref 4.6–10.2)

## 2012-09-11 LAB — HEPATIC FUNCTION PANEL
ALT: 16 U/L (ref 0–53)
Total Protein: 6.6 g/dL (ref 6.0–8.3)

## 2012-09-11 NOTE — Progress Notes (Signed)
Patient came in for labs only.

## 2012-09-12 LAB — VITAMIN D 25 HYDROXY (VIT D DEFICIENCY, FRACTURES): Vit D, 25-Hydroxy: 57 ng/mL (ref 30–89)

## 2012-09-14 LAB — NMR LIPOPROFILE WITH LIPIDS
HDL Size: 9.1 nm — ABNORMAL LOW (ref >=9.2)
LDL Particle Number: 865 nmol/L (ref <1000)
Large VLDL-P: 1.5 nmol/L (ref <=2.7)
Small LDL Particle Number: 437 nmol/L (ref <=527)
VLDL Size: 40.5 nm (ref <=46.6)

## 2012-10-04 ENCOUNTER — Encounter: Payer: Self-pay | Admitting: Family Medicine

## 2012-10-04 ENCOUNTER — Ambulatory Visit (INDEPENDENT_AMBULATORY_CARE_PROVIDER_SITE_OTHER): Payer: Medicare Other | Admitting: Family Medicine

## 2012-10-04 VITALS — BP 102/58 | HR 66 | Temp 97.1°F | Ht 69.25 in | Wt 173.6 lb

## 2012-10-04 DIAGNOSIS — E785 Hyperlipidemia, unspecified: Secondary | ICD-10-CM | POA: Diagnosis not present

## 2012-10-04 DIAGNOSIS — N529 Male erectile dysfunction, unspecified: Secondary | ICD-10-CM

## 2012-10-04 DIAGNOSIS — R972 Elevated prostate specific antigen [PSA]: Secondary | ICD-10-CM | POA: Diagnosis not present

## 2012-10-04 NOTE — Patient Instructions (Addendum)
Fall precautions discussed Continue current meds and therapeutic lifestyle changes 

## 2012-10-04 NOTE — Progress Notes (Signed)
  Subjective:    Patient ID: Cameron Mercury., male    DOB: 09/16/1932, 79 y.o.   MRN: [id number]  HPI This patient presents for recheck of multiple medical problems. No one and will accompanies the patient today.Doing well post surgery. Labs reviewed with patient. Cough and all but   Patient Active Problem List   Diagnosis Date Noted  . Postoperative anemia due to acute blood loss 05/18/2012  . Gilberts syndrome   . Inguinal hernia recurrent unilateral   . Elevated PSA   . Erectile dysfunction   . hyperlipidemia   . Osteoarthrosis and allied disorders     In addition, see ROS  The allergies, current medications, past medical history, surgical history, family and social history are reviewed.  Immunizations reviewed.  Health maintenance reviewed.  The following items are outstandin none ,except FOBT      Review of Systems  Constitutional: Positive for fatigue (some since knee replacement).  HENT: Negative.   Eyes: Negative.   Respiratory: Negative.   Cardiovascular: Negative.   Gastrointestinal: Negative.   Genitourinary: Positive for frequency. Negative for dysuria.  Musculoskeletal: Positive for back pain (LBP occasionally) and arthralgias (bilateral hands).  Skin: Positive for rash (mid L back, persistent itch).  Allergic/Immunologic: Negative.   Neurological: Negative.   Psychiatric/Behavioral: Positive for sleep disturbance (wakes to go to restroom).       Objective:   Physical Exam BP 102/58  Pulse 66  Temp(Src) 97.1 F (36.2 C) (Oral)  Ht 5' 9.25" (1.759 m)  Wt 173 lb 9.6 oz (78.744 kg)  BMI 25.45 kg/m2  The patient appeared well nourished and normally developed, alert and oriented to time and place. Speech, behavior and judgement appear normal.Positive attitude. Vital signs as documented.  Head exam is unremarkable. No scleral icterus or pallor noted.  Neck is without jugular venous distension, thyromegally, or carotid bruits. Carotid upstrokes are  brisk bilaterally. No cervical adenopathy. Lungs are clear anteriorly and posteriorly to auscultation. Normal respiratory effort. Cardiac exam reveals regular rate and rhythm @ 60/min. First and second heart sounds normal.  No murmurs, rubs or gallops.  Abdominal exam reveals normal bowl sounds, no masses, no organomegaly and no aortic enlargement. No inguinal adenopathy. Extremities are nonedematous and both femoral and pedal pulses are normal. Skin without pallor or jaundice.  Warm and dry, without rash. Neurologic exam reveals normal deep tendon reflexes and normal sensation.          Assessment & Plan:  hyperlipidemia  Gilberts syndrome  Erectile dysfunction  Elevated PSA  Patient Instructions  Fall precautions discussed Continue current meds and therapeutic lifestyle changes

## 2012-10-08 ENCOUNTER — Other Ambulatory Visit (INDEPENDENT_AMBULATORY_CARE_PROVIDER_SITE_OTHER): Payer: Medicare Other

## 2012-10-08 DIAGNOSIS — Z1212 Encounter for screening for malignant neoplasm of rectum: Secondary | ICD-10-CM | POA: Diagnosis not present

## 2012-10-29 ENCOUNTER — Other Ambulatory Visit (INDEPENDENT_AMBULATORY_CARE_PROVIDER_SITE_OTHER): Payer: Medicare Other

## 2012-10-29 DIAGNOSIS — E785 Hyperlipidemia, unspecified: Secondary | ICD-10-CM | POA: Diagnosis not present

## 2012-10-29 LAB — HEPATIC FUNCTION PANEL
AST: 13 U/L (ref 0–37)
Albumin: 4.3 g/dL (ref 3.5–5.2)
Alkaline Phosphatase: 68 U/L (ref 39–117)
Total Protein: 6.2 g/dL (ref 6.0–8.3)

## 2012-11-07 ENCOUNTER — Telehealth: Payer: Self-pay | Admitting: *Deleted

## 2012-11-07 NOTE — Telephone Encounter (Signed)
Pt's wife notified of results. 

## 2012-11-22 DIAGNOSIS — Z96659 Presence of unspecified artificial knee joint: Secondary | ICD-10-CM | POA: Diagnosis not present

## 2012-11-29 DIAGNOSIS — R350 Frequency of micturition: Secondary | ICD-10-CM | POA: Diagnosis not present

## 2012-12-31 DIAGNOSIS — G5600 Carpal tunnel syndrome, unspecified upper limb: Secondary | ICD-10-CM | POA: Diagnosis not present

## 2013-01-08 ENCOUNTER — Other Ambulatory Visit: Payer: Medicare Other

## 2013-01-31 DIAGNOSIS — Z23 Encounter for immunization: Secondary | ICD-10-CM | POA: Diagnosis not present

## 2013-02-23 ENCOUNTER — Other Ambulatory Visit: Payer: Self-pay | Admitting: Family Medicine

## 2013-03-04 ENCOUNTER — Ambulatory Visit (INDEPENDENT_AMBULATORY_CARE_PROVIDER_SITE_OTHER): Payer: Medicare Other

## 2013-03-04 ENCOUNTER — Ambulatory Visit (INDEPENDENT_AMBULATORY_CARE_PROVIDER_SITE_OTHER): Payer: Medicare Other | Admitting: Family Medicine

## 2013-03-04 ENCOUNTER — Encounter: Payer: Self-pay | Admitting: Family Medicine

## 2013-03-04 VITALS — BP 120/72 | HR 57 | Temp 97.3°F | Ht 69.25 in | Wt 173.0 lb

## 2013-03-04 DIAGNOSIS — M79609 Pain in unspecified limb: Secondary | ICD-10-CM

## 2013-03-04 DIAGNOSIS — M66222 Spontaneous rupture of extensor tendons, left upper arm: Secondary | ICD-10-CM

## 2013-03-04 DIAGNOSIS — S40022A Contusion of left upper arm, initial encounter: Secondary | ICD-10-CM

## 2013-03-04 DIAGNOSIS — M66329 Spontaneous rupture of flexor tendons, unspecified upper arm: Secondary | ICD-10-CM

## 2013-03-04 DIAGNOSIS — M79622 Pain in left upper arm: Secondary | ICD-10-CM

## 2013-03-04 DIAGNOSIS — S40029A Contusion of unspecified upper arm, initial encounter: Secondary | ICD-10-CM

## 2013-03-04 NOTE — Addendum Note (Signed)
Addended by: Magdalene River on: 03/04/2013 04:05 PM   Modules accepted: Orders

## 2013-03-04 NOTE — Progress Notes (Signed)
Subjective:    Patient ID: Cameron [name]., male    DOB: [birth year], 79 y.o.   MRN: [id number]  HPI Patient here today for left upper arm pain- bruising. Patient indicates that this injury occurred with swinging a golf club about one week ago. The pain developed suddenly after this. It is somewhat improved but pain is continuing.    Patient Active Problem List   Diagnosis Date Noted  . Postoperative anemia due to acute blood loss 05/18/2012  . Gilberts syndrome   . Inguinal hernia recurrent unilateral   . Elevated PSA   . Erectile dysfunction   . hyperlipidemia   . Osteoarthrosis and allied disorders    Outpatient Encounter Prescriptions as of 03/04/2013  Medication Sig  . aspirin EC 81 MG tablet Take 81 mg by mouth daily.  . cholecalciferol (VITAMIN D) 1000 UNITS tablet Take 1,000 Units by mouth daily. 2 tablets weekdays  Then 3 tablets daily on Sat and Sun  . co-enzyme Q-10 30 MG capsule Take 30 mg by mouth every other day.  . simvastatin (ZOCOR) 40 MG tablet TAKE 1 TABLET DAILY  . Tamsulosin HCl (FLOMAX) 0.4 MG CAPS Take 0.4 mg by mouth daily after supper.  . [DISCONTINUED] Flaxseed, Linseed, 1000 MG CAPS Take 1 capsule by mouth daily.  . [DISCONTINUED] methocarbamol (ROBAXIN) 500 MG tablet Take 1 tablet (500 mg total) by mouth every 6 (six) hours as needed.    Review of Systems  Constitutional: Negative.   HENT: Negative.   Eyes: Negative.   Respiratory: Negative.   Cardiovascular: Negative.   Gastrointestinal: Negative.   Endocrine: Negative.   Genitourinary: Negative.   Musculoskeletal: Negative.   Skin: Negative.        Upper left arm bruising and swelling  Allergic/Immunologic: Negative.   Neurological: Negative.   Hematological: Negative.   Psychiatric/Behavioral: Negative.        Objective:   Physical Exam  Nursing note and vitals reviewed. Constitutional: He is oriented to person, place, and time. He appears well-developed and well-nourished.  HENT:   Head: Normocephalic.  Eyes: Conjunctivae are normal.  Neck: Normal range of motion.  Musculoskeletal: He exhibits edema and tenderness.  Limited range of motion of left arm without pain in the upper arm. There is bruising and swelling of the left arm. There is tenderness of the left arm.  Neurological: He is alert and oriented to person, place, and time.  Skin: Skin is warm and dry. No rash noted. No erythema.  There is bruising noted left arm.  Psychiatric: He has a normal mood and affect. His behavior is normal. Judgment and thought content normal.   BP 120/72  Pulse 57  Temp(Src) 97.3 F (36.3 C) (Oral)  Ht 5' 9.25" (1.759 m)  Wt 173 lb (78.472 kg)  BMI 25.36 kg/m2  [facility] reading (PRIMARY) by  Dr. Christell Constant: Low-lying muscle left arm appears abnormal                                      Assessment & Plan:   1. Spontaneous rupture of extensor tendon of left upper arm   2. Left upper arm pain   3. Traumatic hematoma of upper arm, left, initial encounter    Orders Placed This Encounter  Procedures  . DG Humerus Left    Standing Status: Future     Number of Occurrences: 1     Standing  Expiration Date: 05/04/2014    Order Specific Question:  Reason for Exam (SYMPTOM  OR DIAGNOSIS REQUIRED)    Answer:  left upper arm pain    Order Specific Question:  Preferred imaging location?    Answer:  Internal   Patient Instructions  Use warm wet compresses to arm 20 minutes 4 times daily Take 2 advil after dinner for the pain and after breakfast Once x-rays are read by the radiologist we will call you with the next appointment      Nyra Capes MD

## 2013-03-04 NOTE — Patient Instructions (Addendum)
Use warm wet compresses to arm 20 minutes 4 times daily Take 2 advil after dinner for the pain and after breakfast Once x-rays are read by the radiologist we will call you with the next appointment

## 2013-03-12 ENCOUNTER — Ambulatory Visit (HOSPITAL_COMMUNITY)
Admission: RE | Admit: 2013-03-12 | Discharge: 2013-03-12 | Disposition: A | Discharge status: Home / Self Care | Payer: Medicare Other | Source: Ambulatory Visit | Attending: Family Medicine | Admitting: Family Medicine

## 2013-03-12 ENCOUNTER — Other Ambulatory Visit: Payer: Self-pay

## 2013-03-12 ENCOUNTER — Encounter (HOSPITAL_COMMUNITY): Payer: Self-pay

## 2013-03-12 DIAGNOSIS — IMO0002 Reserved for concepts with insufficient information to code with codable children: Secondary | ICD-10-CM | POA: Insufficient documentation

## 2013-03-12 DIAGNOSIS — S4992XA Unspecified injury of left shoulder and upper arm, initial encounter: Secondary | ICD-10-CM

## 2013-03-12 DIAGNOSIS — S40029A Contusion of unspecified upper arm, initial encounter: Secondary | ICD-10-CM | POA: Diagnosis not present

## 2013-03-12 DIAGNOSIS — M75100 Unspecified rotator cuff tear or rupture of unspecified shoulder, not specified as traumatic: Secondary | ICD-10-CM | POA: Diagnosis not present

## 2013-03-12 DIAGNOSIS — M66222 Spontaneous rupture of extensor tendons, left upper arm: Secondary | ICD-10-CM

## 2013-03-12 DIAGNOSIS — Y9353 Activity, golf: Secondary | ICD-10-CM | POA: Insufficient documentation

## 2013-03-12 DIAGNOSIS — S40022A Contusion of left upper arm, initial encounter: Secondary | ICD-10-CM

## 2013-03-12 DIAGNOSIS — M19019 Primary osteoarthritis, unspecified shoulder: Secondary | ICD-10-CM | POA: Insufficient documentation

## 2013-03-12 DIAGNOSIS — M79622 Pain in left upper arm: Secondary | ICD-10-CM

## 2013-03-12 DIAGNOSIS — S43499A Other sprain of unspecified shoulder joint, initial encounter: Secondary | ICD-10-CM | POA: Insufficient documentation

## 2013-03-12 DIAGNOSIS — R972 Elevated prostate specific antigen [PSA]: Secondary | ICD-10-CM | POA: Diagnosis not present

## 2013-03-12 DIAGNOSIS — M25419 Effusion, unspecified shoulder: Secondary | ICD-10-CM | POA: Insufficient documentation

## 2013-03-12 MED ORDER — GADOBENATE DIMEGLUMINE 529 MG/ML IV SOLN
16.0000 mL | Freq: Once | INTRAVENOUS | Status: AC | PRN
  Administered 2013-03-12: 16 mL via INTRAVENOUS

## 2013-03-15 DIAGNOSIS — S43499A Other sprain of unspecified shoulder joint, initial encounter: Secondary | ICD-10-CM | POA: Diagnosis not present

## 2013-03-15 DIAGNOSIS — S43429A Sprain of unspecified rotator cuff capsule, initial encounter: Secondary | ICD-10-CM | POA: Diagnosis not present

## 2013-03-25 ENCOUNTER — Ambulatory Visit: Payer: Medicare Other | Attending: Specialist | Admitting: Physical Therapy

## 2013-03-25 DIAGNOSIS — IMO0001 Reserved for inherently not codable concepts without codable children: Secondary | ICD-10-CM | POA: Diagnosis not present

## 2013-03-25 DIAGNOSIS — R5381 Other malaise: Secondary | ICD-10-CM | POA: Insufficient documentation

## 2013-03-25 DIAGNOSIS — M25519 Pain in unspecified shoulder: Secondary | ICD-10-CM | POA: Insufficient documentation

## 2013-03-25 DIAGNOSIS — M25619 Stiffness of unspecified shoulder, not elsewhere classified: Secondary | ICD-10-CM | POA: Insufficient documentation

## 2013-03-26 DIAGNOSIS — R351 Nocturia: Secondary | ICD-10-CM | POA: Diagnosis not present

## 2013-03-26 DIAGNOSIS — N139 Obstructive and reflux uropathy, unspecified: Secondary | ICD-10-CM | POA: Diagnosis not present

## 2013-03-26 DIAGNOSIS — R972 Elevated prostate specific antigen [PSA]: Secondary | ICD-10-CM | POA: Diagnosis not present

## 2013-03-26 DIAGNOSIS — N401 Enlarged prostate with lower urinary tract symptoms: Secondary | ICD-10-CM | POA: Diagnosis not present

## 2013-03-28 ENCOUNTER — Ambulatory Visit: Payer: Medicare Other | Admitting: Physical Therapy

## 2013-03-28 DIAGNOSIS — M25519 Pain in unspecified shoulder: Secondary | ICD-10-CM | POA: Diagnosis not present

## 2013-03-28 DIAGNOSIS — R5381 Other malaise: Secondary | ICD-10-CM | POA: Diagnosis not present

## 2013-03-28 DIAGNOSIS — IMO0001 Reserved for inherently not codable concepts without codable children: Secondary | ICD-10-CM | POA: Diagnosis not present

## 2013-03-28 DIAGNOSIS — M25619 Stiffness of unspecified shoulder, not elsewhere classified: Secondary | ICD-10-CM | POA: Diagnosis not present

## 2013-04-02 ENCOUNTER — Other Ambulatory Visit (INDEPENDENT_AMBULATORY_CARE_PROVIDER_SITE_OTHER): Payer: Medicare Other

## 2013-04-02 DIAGNOSIS — E559 Vitamin D deficiency, unspecified: Secondary | ICD-10-CM

## 2013-04-02 DIAGNOSIS — E785 Hyperlipidemia, unspecified: Secondary | ICD-10-CM

## 2013-04-02 NOTE — Progress Notes (Signed)
Pt came in for labs only 

## 2013-04-02 NOTE — Addendum Note (Signed)
Addended by: Prescott Gum on: 04/02/2013 08:27 AM   Modules accepted: Orders

## 2013-04-04 LAB — HEPATIC FUNCTION PANEL
ALT: 14 IU/L (ref 0–44)
AST: 15 IU/L (ref 0–40)
Alkaline Phosphatase: 75 IU/L (ref 39–117)
Total Bilirubin: 2.1 mg/dL — ABNORMAL HIGH (ref 0.0–1.2)

## 2013-04-04 LAB — NMR, LIPOPROFILE
Cholesterol: 130 mg/dL (ref <200)
HDL Cholesterol by NMR: 51 mg/dL (ref >=40)
LDL Particle Number: 880 nmol/L (ref <1000)
LDL Size: 20.5 nm — ABNORMAL LOW (ref >20.5)
Triglycerides by NMR: 57 mg/dL (ref <150)

## 2013-04-04 LAB — BMP8+EGFR
GFR calc Af Amer: 102 mL/min/{1.73_m2} (ref >59)
GFR calc non Af Amer: 88 mL/min/{1.73_m2} (ref >59)
Glucose: 103 mg/dL — ABNORMAL HIGH (ref 65–99)
Potassium: 4.2 mmol/L (ref 3.5–5.2)
Sodium: 140 mmol/L (ref 134–144)

## 2013-04-11 ENCOUNTER — Encounter: Payer: Self-pay | Admitting: Family Medicine

## 2013-04-11 ENCOUNTER — Ambulatory Visit (INDEPENDENT_AMBULATORY_CARE_PROVIDER_SITE_OTHER): Payer: Medicare Other | Admitting: Family Medicine

## 2013-04-11 VITALS — BP 110/65 | HR 62 | Temp 97.3°F | Ht 69.25 in | Wt 176.0 lb

## 2013-04-11 DIAGNOSIS — E785 Hyperlipidemia, unspecified: Secondary | ICD-10-CM

## 2013-04-11 DIAGNOSIS — N400 Enlarged prostate without lower urinary tract symptoms: Secondary | ICD-10-CM

## 2013-04-11 DIAGNOSIS — R972 Elevated prostate specific antigen [PSA]: Secondary | ICD-10-CM

## 2013-04-11 DIAGNOSIS — E559 Vitamin D deficiency, unspecified: Secondary | ICD-10-CM | POA: Diagnosis not present

## 2013-04-11 DIAGNOSIS — Z23 Encounter for immunization: Secondary | ICD-10-CM | POA: Diagnosis not present

## 2013-04-11 DIAGNOSIS — Z91038 Other insect allergy status: Secondary | ICD-10-CM | POA: Diagnosis not present

## 2013-04-11 DIAGNOSIS — K4090 Unilateral inguinal hernia, without obstruction or gangrene, not specified as recurrent: Secondary | ICD-10-CM

## 2013-04-11 DIAGNOSIS — M19019 Primary osteoarthritis, unspecified shoulder: Secondary | ICD-10-CM

## 2013-04-11 DIAGNOSIS — Z139 Encounter for screening, unspecified: Secondary | ICD-10-CM

## 2013-04-11 DIAGNOSIS — Z91030 Bee allergy status: Secondary | ICD-10-CM

## 2013-04-11 DIAGNOSIS — M12812 Other specific arthropathies, not elsewhere classified, left shoulder: Secondary | ICD-10-CM

## 2013-04-11 MED ORDER — EPINEPHRINE 0.3 MG/0.3ML IJ SOAJ: 0.3000 mg | Freq: Once | INTRAMUSCULAR | Status: DC

## 2013-04-11 NOTE — Progress Notes (Signed)
Subjective:    Patient ID: Cameron Mercury., male    DOB: Aug 31, 1932, 79 y.o.   MRN: [id number]  HPI Pt here for follow up and management of chronic medical problems. The patient is followed by his urologist for the prostate and elevated PSA readings. He is DU a followup visit with him in a few weeks. He does have concerns about hernia and wants an area on his back to be checked today. We will review his labs and he will receive a Prevnar vaccine today. He'll also need a CBC today before he leaves the office to follow up on his anemia from his        Patient Active Problem List   Diagnosis Date Noted  . Postoperative anemia due to acute blood loss 05/18/2012  . Gilberts syndrome   . Inguinal hernia recurrent unilateral   . Elevated PSA   . Erectile dysfunction   . hyperlipidemia   . Osteoarthrosis and allied disorders    Outpatient Encounter Prescriptions as of 04/11/2013  Medication Sig  . aspirin EC 81 MG tablet Take 81 mg by mouth daily.  . cholecalciferol (VITAMIN D) 1000 UNITS tablet Take 1,000 Units by mouth daily. 2 tablets weekdays  Then 3 tablets daily on Sat and Sun  . co-enzyme Q-10 30 MG capsule Take 30 mg by mouth every other day.  . Flaxseed, Linseed, (FLAX SEED OIL) 1000 MG CAPS Take 1 capsule by mouth daily.  Marland Kitchen glucosamine-chondroitin 500-400 MG tablet Take 1 tablet by mouth daily.  . simvastatin (ZOCOR) 40 MG tablet TAKE 1 TABLET DAILY  . Tamsulosin HCl (FLOMAX) 0.4 MG CAPS Take 0.4 mg by mouth daily after supper.    Review of Systems  Constitutional: Negative.   HENT: Negative.   Eyes: Negative.   Respiratory: Negative.   Cardiovascular: Negative.   Gastrointestinal: Negative.   Endocrine: Negative.   Genitourinary: Negative.        Check hernia  Musculoskeletal: Negative.   Skin: Negative.        Check new place on back  Allergic/Immunologic: Negative.   Neurological: Negative.   Hematological: Negative.   Psychiatric/Behavioral: Negative.        Objective:   Physical Exam  Nursing note and vitals reviewed. Constitutional: He is oriented to person, place, and time. He appears well-developed and well-nourished. No distress.  HENT:  Head: Normocephalic and atraumatic.  Right Ear: External ear normal.  Left Ear: External ear normal.  Nose: Nose normal.  Mouth/Throat: Oropharynx is clear and moist. No oropharyngeal exudate.  Mild nasal congestion bilaterally  Eyes: Conjunctivae and EOM are normal. Pupils are equal, round, and reactive to light. Right eye exhibits no discharge. Left eye exhibits no discharge. No scleral icterus.  Neck: Normal range of motion. Neck supple. No thyromegaly present.  No carotid bruits  Cardiovascular: Normal rate, regular rhythm, normal heart sounds and intact distal pulses.  Exam reveals no gallop and no friction rub.   No murmur heard. At 60 per minute  Pulmonary/Chest: Effort normal and breath sounds normal. No respiratory distress. He has no wheezes. He has no rales. He exhibits no tenderness.  Abdominal: Soft. Bowel sounds are normal. He exhibits no mass. There is no tenderness. There is no rebound and no guarding.  Genitourinary:  Patient continues to have a right inguinal hernia, external genitalia are otherwise normal.  Musculoskeletal: Normal range of motion. He exhibits no edema and no tenderness.  Lymphadenopathy:    He has no cervical adenopathy.  Neurological: He is alert and oriented to person, place, and time. He has normal reflexes. No cranial nerve deficit.  Skin: Skin is warm and dry. No rash noted. No erythema. No pallor.  He has very dry skin and nail tenia  Psychiatric: He has a normal mood and affect. His behavior is normal. Judgment and thought content normal.   BP 110/65  Pulse 62  Temp(Src) 97.3 F (36.3 C) (Oral)  Ht 5' 9.25" (1.759 m)  Wt 176 lb (79.833 kg)  BMI 25.80 kg/m2        Assessment & Plan:  1. Elevated PSA  2. hyperlipidemia  3. Vitamin D  deficiency  4. Bee sting allergy - EPINEPHrine (EPIPEN) 0.3 mg/0.3 mL SOAJ injection; Inject 0.3 mLs (0.3 mg total) into the muscle once.  Dispense: 1 Device; Refill: 3  5. BPH (benign prostatic hyperplasia)  6. Rotator cuff tear arthropathy, left  7. Right inguinal hernia  Patient Instructions  Continue current medications. Continue good therapeutic lifestyle changes which include good diet and exercise. Fall precautions discussed with patient. Schedule your flu vaccine if you haven't had it yet If you are over 8 years old - you may need Prevnar 13 or the adult Pneumonia vaccine. Arrange visit with [facility] dermatology for general skin evaluation Arrange a visit with Dr. Ovidio Kin to evaluate your right inguinal hernia He will receive the Prevnar vaccination today and this may make her arm sore for 2-3 days. Continued follow up with the urologist for the elevated PSA and BPH Continue followup with the orthopedist because of the rotator cuff tears As discussed in the visit where we'll let you hold the Zocor or simvastatin for the next 3-4 months and at that point in time recheck and advanced lipid panel and liver function test Continue aggressive therapeutic lifestyle changes   Nyra Capes MD

## 2013-04-11 NOTE — Addendum Note (Signed)
Addended by: Magdalene River on: 04/11/2013 09:32 AM   Modules accepted: Orders

## 2013-04-11 NOTE — Patient Instructions (Addendum)
Continue current medications. Continue good therapeutic lifestyle changes which include good diet and exercise. Fall precautions discussed with patient. Schedule your flu vaccine if you haven't had it yet If you are over 77 years old - you may need Prevnar 13 or the adult Pneumonia vaccine. Arrange visit with [facility] dermatology for general skin evaluation Arrange a visit with Dr. Ovidio Kin to evaluate your right inguinal hernia He will receive the Prevnar vaccination today and this may make her arm sore for 2-3 days. Continued follow up with the urologist for the elevated PSA and BPH Continue followup with the orthopedist because of the rotator cuff tears As discussed in the visit where we'll let you hold the Zocor or simvastatin for the next 3-4 months and at that point in time recheck and advanced lipid panel and liver function test Continue aggressive therapeutic lifestyle changes

## 2013-04-24 DIAGNOSIS — G5600 Carpal tunnel syndrome, unspecified upper limb: Secondary | ICD-10-CM | POA: Diagnosis not present

## 2013-05-01 DIAGNOSIS — N139 Obstructive and reflux uropathy, unspecified: Secondary | ICD-10-CM | POA: Diagnosis not present

## 2013-05-01 DIAGNOSIS — N138 Other obstructive and reflux uropathy: Secondary | ICD-10-CM | POA: Diagnosis not present

## 2013-05-01 DIAGNOSIS — R972 Elevated prostate specific antigen [PSA]: Secondary | ICD-10-CM | POA: Diagnosis not present

## 2013-05-01 DIAGNOSIS — N401 Enlarged prostate with lower urinary tract symptoms: Secondary | ICD-10-CM | POA: Diagnosis not present

## 2013-05-06 DIAGNOSIS — N401 Enlarged prostate with lower urinary tract symptoms: Secondary | ICD-10-CM | POA: Diagnosis not present

## 2013-05-06 DIAGNOSIS — R972 Elevated prostate specific antigen [PSA]: Secondary | ICD-10-CM | POA: Diagnosis not present

## 2013-05-06 DIAGNOSIS — N139 Obstructive and reflux uropathy, unspecified: Secondary | ICD-10-CM | POA: Diagnosis not present

## 2013-05-06 DIAGNOSIS — N529 Male erectile dysfunction, unspecified: Secondary | ICD-10-CM | POA: Diagnosis not present

## 2013-05-07 ENCOUNTER — Other Ambulatory Visit: Payer: Self-pay | Admitting: Urology

## 2013-05-08 ENCOUNTER — Ambulatory Visit (INDEPENDENT_AMBULATORY_CARE_PROVIDER_SITE_OTHER): Payer: Medicare Other | Admitting: Surgery

## 2013-05-14 ENCOUNTER — Encounter (HOSPITAL_COMMUNITY): Payer: Self-pay | Admitting: Pharmacy Technician

## 2013-05-15 ENCOUNTER — Other Ambulatory Visit (HOSPITAL_COMMUNITY): Payer: Self-pay | Admitting: Urology

## 2013-05-15 DIAGNOSIS — S43429A Sprain of unspecified rotator cuff capsule, initial encounter: Secondary | ICD-10-CM | POA: Diagnosis not present

## 2013-05-15 DIAGNOSIS — S43499A Other sprain of unspecified shoulder joint, initial encounter: Secondary | ICD-10-CM | POA: Diagnosis not present

## 2013-05-15 NOTE — Patient Instructions (Addendum)
[place].  05/15/2013   Your procedure is scheduled on: Thursday January 29th  Report to [place] at 700 AM.  Call this number if you have problems the morning of surgery [phone]   Remember:   Do not eat food or drink liquids :After Midnight.     Take these medicines the morning of surgery with A SIP OF WATER: flomax                                SEE [place]             You may not have any metal on your body including hair pins and piercings  Do not wear jewelry, make-up.  Do not wear lotions, powders, or perfumes. You may wear deodorant.   Men may shave face and neck.  Do not bring valuables to the hospital. [place].  Contacts, dentures or bridgework may not be worn into surgery.  Leave suitcase in the car. After surgery it may be brought to your room.  For patients admitted to the hospital, checkout time is 11:00 AM the day of discharge.    Please read over the following fact sheets that you were given: [facility] Preparing for surgery sheet  Call Zelphia Cairo RN pre op nurse if needed [phone]    [place].  PATIENT SIGNATURE___________________________________________  NURSE SIGNATURE_____________________________________________

## 2013-05-16 ENCOUNTER — Encounter (HOSPITAL_COMMUNITY)
Admission: RE | Admit: 2013-05-16 | Discharge: 2013-05-16 | Disposition: A | Discharge status: Home / Self Care | Payer: Medicare Other | Source: Ambulatory Visit | Attending: Urology | Admitting: Urology

## 2013-05-16 ENCOUNTER — Encounter (HOSPITAL_COMMUNITY): Payer: Self-pay

## 2013-05-16 DIAGNOSIS — Z01812 Encounter for preprocedural laboratory examination: Secondary | ICD-10-CM | POA: Insufficient documentation

## 2013-05-16 HISTORY — DX: Hyperlipidemia, unspecified: E78.5

## 2013-05-16 HISTORY — DX: Unspecified rotator cuff tear or rupture of left shoulder, not specified as traumatic: M75.102

## 2013-05-16 HISTORY — DX: Nocturia: R35.1

## 2013-05-16 LAB — CBC
HCT: 47.2 % (ref 39.0–52.0)
HEMOGLOBIN: 16 g/dL (ref 13.0–17.0)
MCH: 30.9 pg (ref 26.0–34.0)
MCHC: 33.9 g/dL (ref 30.0–36.0)
MCV: 91.1 fL (ref 78.0–100.0)
PLATELETS: 194 10*3/uL (ref 150–400)
RBC: 5.18 MIL/uL (ref 4.22–5.81)
RDW: 13.3 % (ref 11.5–15.5)
WBC: 7 10*3/uL (ref 4.0–10.5)

## 2013-05-16 LAB — COMPREHENSIVE METABOLIC PANEL
ALT: 14 U/L (ref 0–53)
AST: 15 U/L (ref 0–37)
Albumin: 4.1 g/dL (ref 3.5–5.2)
Alkaline Phosphatase: 76 U/L (ref 39–117)
BUN: 18 mg/dL (ref 6–23)
CALCIUM: 9.6 mg/dL (ref 8.4–10.5)
CHLORIDE: 102 meq/L (ref 96–112)
CO2: 30 meq/L (ref 19–32)
CREATININE: 0.78 mg/dL (ref 0.50–1.35)
GFR calc Af Amer: >90 mL/min (ref >90)
GFR, EST NON AFRICAN AMERICAN: 84 mL/min — AB (ref >90)
GLUCOSE: 72 mg/dL (ref 70–99)
Potassium: 4.5 mEq/L (ref 3.7–5.3)
Sodium: 141 mEq/L (ref 137–147)
Total Bilirubin: 1.6 mg/dL — ABNORMAL HIGH (ref 0.3–1.2)
Total Protein: 6.8 g/dL (ref 6.0–8.3)

## 2013-05-23 ENCOUNTER — Encounter (HOSPITAL_COMMUNITY): Payer: Self-pay | Admitting: *Deleted

## 2013-05-23 ENCOUNTER — Ambulatory Visit (HOSPITAL_COMMUNITY): Payer: Medicare Other | Admitting: *Deleted

## 2013-05-23 ENCOUNTER — Observation Stay (HOSPITAL_COMMUNITY)
Admission: RE | Admit: 2013-05-23 | Discharge: 2013-05-25 | Disposition: A | Discharge status: Home / Self Care | Payer: Medicare Other | Source: Ambulatory Visit | Attending: Urology | Admitting: Urology

## 2013-05-23 ENCOUNTER — Encounter (HOSPITAL_COMMUNITY)
Admission: RE | Disposition: A | Discharge status: Home / Self Care | Payer: Self-pay | Source: Ambulatory Visit | Attending: Urology

## 2013-05-23 ENCOUNTER — Encounter (HOSPITAL_COMMUNITY): Payer: Medicare Other | Admitting: *Deleted

## 2013-05-23 DIAGNOSIS — N411 Chronic prostatitis: Secondary | ICD-10-CM | POA: Diagnosis not present

## 2013-05-23 DIAGNOSIS — N401 Enlarged prostate with lower urinary tract symptoms: Secondary | ICD-10-CM | POA: Diagnosis not present

## 2013-05-23 DIAGNOSIS — N529 Male erectile dysfunction, unspecified: Secondary | ICD-10-CM | POA: Diagnosis not present

## 2013-05-23 DIAGNOSIS — R972 Elevated prostate specific antigen [PSA]: Secondary | ICD-10-CM | POA: Diagnosis not present

## 2013-05-23 DIAGNOSIS — N139 Obstructive and reflux uropathy, unspecified: Secondary | ICD-10-CM | POA: Diagnosis not present

## 2013-05-23 DIAGNOSIS — N400 Enlarged prostate without lower urinary tract symptoms: Secondary | ICD-10-CM | POA: Diagnosis not present

## 2013-05-23 DIAGNOSIS — N138 Other obstructive and reflux uropathy: Principal | ICD-10-CM | POA: Insufficient documentation

## 2013-05-23 DIAGNOSIS — Z7982 Long term (current) use of aspirin: Secondary | ICD-10-CM | POA: Diagnosis not present

## 2013-05-23 DIAGNOSIS — Z79899 Other long term (current) drug therapy: Secondary | ICD-10-CM | POA: Diagnosis not present

## 2013-05-23 DIAGNOSIS — Z87891 Personal history of nicotine dependence: Secondary | ICD-10-CM | POA: Insufficient documentation

## 2013-05-23 DIAGNOSIS — N419 Inflammatory disease of prostate, unspecified: Secondary | ICD-10-CM | POA: Diagnosis not present

## 2013-05-23 DIAGNOSIS — M129 Arthropathy, unspecified: Secondary | ICD-10-CM | POA: Diagnosis not present

## 2013-05-23 DIAGNOSIS — R351 Nocturia: Secondary | ICD-10-CM | POA: Insufficient documentation

## 2013-05-23 DIAGNOSIS — R350 Frequency of micturition: Secondary | ICD-10-CM | POA: Insufficient documentation

## 2013-05-23 DIAGNOSIS — I739 Peripheral vascular disease, unspecified: Secondary | ICD-10-CM | POA: Insufficient documentation

## 2013-05-23 HISTORY — PX: TRANSURETHRAL RESECTION OF PROSTATE: SHX73

## 2013-05-23 SURGERY — TRANSURETHRAL RESECTION OF THE PROSTATE WITH GYRUS INSTRUMENTS
Anesthesia: General

## 2013-05-23 MED ORDER — OXYBUTYNIN CHLORIDE 5 MG PO TABS
5.0000 mg | ORAL_TABLET | Freq: Three times a day (TID) | ORAL | Status: DC | PRN
  Filled 2013-05-23: qty 1

## 2013-05-23 MED ORDER — CEFAZOLIN SODIUM-DEXTROSE 2-3 GM-% IV SOLR
INTRAVENOUS | Status: AC
  Filled 2013-05-23: qty 50

## 2013-05-23 MED ORDER — PROMETHAZINE HCL 25 MG/ML IJ SOLN
INTRAMUSCULAR | Status: AC
  Filled 2013-05-23: qty 1

## 2013-05-23 MED ORDER — LIDOCAINE HCL 1 % IJ SOLN
INTRAMUSCULAR | Status: DC | PRN
  Administered 2013-05-23: 50 mg via INTRADERMAL

## 2013-05-23 MED ORDER — FENTANYL CITRATE 0.05 MG/ML IJ SOLN
INTRAMUSCULAR | Status: DC | PRN
  Administered 2013-05-23 (×5): 25 ug via INTRAVENOUS
  Administered 2013-05-23: 50 ug via INTRAVENOUS
  Administered 2013-05-23: 25 ug via INTRAVENOUS

## 2013-05-23 MED ORDER — GLYCOPYRROLATE 0.2 MG/ML IJ SOLN
INTRAMUSCULAR | Status: AC
  Filled 2013-05-23: qty 1

## 2013-05-23 MED ORDER — FENTANYL CITRATE 0.05 MG/ML IJ SOLN
INTRAMUSCULAR | Status: AC
  Filled 2013-05-23: qty 2

## 2013-05-23 MED ORDER — FENTANYL CITRATE 0.05 MG/ML IJ SOLN
25.0000 ug | INTRAMUSCULAR | Status: DC | PRN
  Administered 2013-05-23 (×2): 50 ug via INTRAVENOUS

## 2013-05-23 MED ORDER — PROMETHAZINE HCL 25 MG/ML IJ SOLN
6.2500 mg | INTRAMUSCULAR | Status: DC | PRN
  Administered 2013-05-23: 6.25 mg via INTRAVENOUS

## 2013-05-23 MED ORDER — PROPOFOL 10 MG/ML IV BOLUS
INTRAVENOUS | Status: AC
  Filled 2013-05-23: qty 20

## 2013-05-23 MED ORDER — HYDROMORPHONE HCL PF 1 MG/ML IJ SOLN
INTRAMUSCULAR | Status: AC
  Filled 2013-05-23: qty 1

## 2013-05-23 MED ORDER — SODIUM CHLORIDE 0.9 % IR SOLN
Status: DC | PRN
  Administered 2013-05-23: 25000 mL

## 2013-05-23 MED ORDER — LIDOCAINE HCL (CARDIAC) 20 MG/ML IV SOLN
INTRAVENOUS | Status: AC
  Filled 2013-05-23: qty 5

## 2013-05-23 MED ORDER — TAMSULOSIN HCL 0.4 MG PO CAPS
0.4000 mg | ORAL_CAPSULE | Freq: Two times a day (BID) | ORAL | Status: DC
  Administered 2013-05-23 – 2013-05-24 (×3): 0.4 mg via ORAL
  Filled 2013-05-23 (×5): qty 1

## 2013-05-23 MED ORDER — PROPOFOL 10 MG/ML IV BOLUS
INTRAVENOUS | Status: DC | PRN
  Administered 2013-05-23: 160 mg via INTRAVENOUS

## 2013-05-23 MED ORDER — DIPHENHYDRAMINE HCL 50 MG/ML IJ SOLN: 12.5000 mg | Freq: Four times a day (QID) | INTRAMUSCULAR | Status: DC | PRN

## 2013-05-23 MED ORDER — SODIUM CHLORIDE 0.45 % IV SOLN
INTRAVENOUS | Status: DC
  Administered 2013-05-23: 50 mL/h via INTRAVENOUS
  Administered 2013-05-24: 15:00:00 via INTRAVENOUS

## 2013-05-23 MED ORDER — CIPROFLOXACIN HCL 500 MG PO TABS
500.0000 mg | ORAL_TABLET | Freq: Two times a day (BID) | ORAL | Status: DC
  Administered 2013-05-23 – 2013-05-24 (×3): 500 mg via ORAL
  Filled 2013-05-23 (×5): qty 1

## 2013-05-23 MED ORDER — SIMVASTATIN 20 MG PO TABS
20.0000 mg | ORAL_TABLET | Freq: Every evening | ORAL | Status: DC
Start: 2013-05-23 — End: 2013-05-25
  Filled 2013-05-23 (×3): qty 1

## 2013-05-23 MED ORDER — ONDANSETRON HCL 4 MG/2ML IJ SOLN
4.0000 mg | Freq: Four times a day (QID) | INTRAMUSCULAR | Status: DC | PRN
  Administered 2013-05-23: 4 mg via INTRAVENOUS
  Filled 2013-05-23: qty 2

## 2013-05-23 MED ORDER — SODIUM CHLORIDE 0.9 % IR SOLN
3000.0000 mL | Status: DC
  Administered 2013-05-23 (×2): 3000 mL

## 2013-05-23 MED ORDER — BISACODYL 10 MG RE SUPP
10.0000 mg | Freq: Every day | RECTAL | Status: DC | PRN
  Administered 2013-05-24: 10 mg via RECTAL
  Filled 2013-05-23: qty 1

## 2013-05-23 MED ORDER — ACETAMINOPHEN 325 MG PO TABS: 650.0000 mg | ORAL_TABLET | ORAL | Status: DC | PRN

## 2013-05-23 MED ORDER — DIPHENHYDRAMINE HCL 12.5 MG/5ML PO ELIX: 12.5000 mg | ORAL_SOLUTION | Freq: Four times a day (QID) | ORAL | Status: DC | PRN

## 2013-05-23 MED ORDER — GLYCOPYRROLATE 0.2 MG/ML IJ SOLN
INTRAMUSCULAR | Status: DC | PRN
  Administered 2013-05-23: .1 mg via INTRAVENOUS

## 2013-05-23 MED ORDER — BELLADONNA ALKALOIDS-OPIUM 16.2-60 MG RE SUPP
RECTAL | Status: AC
  Filled 2013-05-23: qty 1

## 2013-05-23 MED ORDER — LACTATED RINGERS IV SOLN
INTRAVENOUS | Status: DC
  Administered 2013-05-23: 14:00:00 via INTRAVENOUS
  Administered 2013-05-23: 1000 mL via INTRAVENOUS
  Administered 2013-05-23: 11:00:00 via INTRAVENOUS

## 2013-05-23 MED ORDER — BELLADONNA ALKALOIDS-OPIUM 16.2-60 MG RE SUPP
RECTAL | Status: DC | PRN
  Administered 2013-05-23: 1 via RECTAL

## 2013-05-23 MED ORDER — HYDROCODONE-ACETAMINOPHEN 5-325 MG PO TABS
1.0000 | ORAL_TABLET | ORAL | Status: DC | PRN
  Administered 2013-05-23: 1 via ORAL
  Filled 2013-05-23: qty 1

## 2013-05-23 MED ORDER — HYDROMORPHONE HCL PF 1 MG/ML IJ SOLN
0.2500 mg | INTRAMUSCULAR | Status: DC | PRN
  Administered 2013-05-23 (×2): 0.5 mg via INTRAVENOUS

## 2013-05-23 MED ORDER — CEFAZOLIN SODIUM-DEXTROSE 2-3 GM-% IV SOLR
2.0000 g | INTRAVENOUS | Status: AC
Start: 2013-05-23 — End: 2013-05-23
  Administered 2013-05-23: 2 g via INTRAVENOUS

## 2013-05-23 MED ORDER — SENNOSIDES-DOCUSATE SODIUM 8.6-50 MG PO TABS
2.0000 | ORAL_TABLET | Freq: Every day | ORAL | Status: DC
  Administered 2013-05-23 – 2013-05-24 (×2): 2 via ORAL
  Filled 2013-05-23 (×3): qty 2

## 2013-05-23 MED ORDER — HYDROMORPHONE HCL PF 1 MG/ML IJ SOLN: 0.5000 mg | INTRAMUSCULAR | Status: DC | PRN

## 2013-05-23 MED ORDER — ONDANSETRON HCL 4 MG/2ML IJ SOLN
INTRAMUSCULAR | Status: DC | PRN
  Administered 2013-05-23: 4 mg via INTRAVENOUS

## 2013-05-23 MED ORDER — ONDANSETRON HCL 4 MG/2ML IJ SOLN
INTRAMUSCULAR | Status: AC
  Filled 2013-05-23: qty 2

## 2013-05-23 MED ORDER — ZOLPIDEM TARTRATE 5 MG PO TABS: 5.0000 mg | ORAL_TABLET | Freq: Every evening | ORAL | Status: DC | PRN

## 2013-05-23 SURGICAL SUPPLY — 21 items
BAG URINE DRAINAGE (UROLOGICAL SUPPLIES) ×1 IMPLANT
BAG URO CATCHER STRL LF (DRAPE) ×2 IMPLANT
BLADE SURG 15 STRL LF DISP TIS (BLADE) IMPLANT
BLADE SURG 15 STRL SS (BLADE)
CATH HEMA 3WAY 30CC 22FR COUDE (CATHETERS) IMPLANT
DRAPE CAMERA CLOSED 9X96 (DRAPES) ×2 IMPLANT
ELECT BUTTON HF 24-28F 2 30DE (ELECTRODE) IMPLANT
ELECT HF RESECT BIPO 24F 45 ND (CUTTING LOOP) IMPLANT
ELECT LOOP MED HF 24F 12D CBL (CLIP) ×1 IMPLANT
ELECT RESECT VAPORIZE 12D CBL (ELECTRODE) IMPLANT
GLOVE BIOGEL M STRL SZ7.5 (GLOVE) ×6 IMPLANT
GOWN STRL REUS W/TWL XL LVL3 (GOWN DISPOSABLE) ×4 IMPLANT
HOLDER FOLEY CATH W/STRAP (MISCELLANEOUS) ×1 IMPLANT
IV NS IRRIG 3000ML ARTHROMATIC (IV SOLUTION) ×1 IMPLANT
KIT ASPIRATION TUBING (SET/KITS/TRAYS/PACK) ×2 IMPLANT
MANIFOLD NEPTUNE II (INSTRUMENTS) ×2 IMPLANT
NS IRRIG 1000ML POUR BTL (IV SOLUTION) ×2 IMPLANT
PACK CYSTO (CUSTOM PROCEDURE TRAY) ×2 IMPLANT
SYR 30ML LL (SYRINGE) ×1 IMPLANT
SYRINGE IRR TOOMEY STRL 70CC (SYRINGE) ×2 IMPLANT
TUBING CONNECTING 10 (TUBING) ×2 IMPLANT

## 2013-05-23 NOTE — Anesthesia Preprocedure Evaluation (Signed)
Anesthesia Evaluation  Patient identified by MRN, date of birth, ID band Patient awake    Reviewed: Allergy & Precautions, H&P , NPO status , Patient's Chart, lab work & pertinent test results  Airway Mallampati: II TM Distance: >3 FB Neck ROM: Full    Dental no notable dental hx.    Pulmonary neg pulmonary ROS, former smoker,  breath sounds clear to auscultation  Pulmonary exam normal       Cardiovascular negative cardio ROS  Rhythm:Regular Rate:Normal     Neuro/Psych PSYCHIATRIC DISORDERS  Neuromuscular disease negative neurological ROS  negative psych ROS   GI/Hepatic negative GI ROS, Neg liver ROS,   Endo/Other  negative endocrine ROS  Renal/GU negative Renal ROS  negative genitourinary   Musculoskeletal negative musculoskeletal ROS (+)   Abdominal   Peds negative pediatric ROS (+)  Hematology negative hematology ROS (+) anemia ,   Anesthesia Other Findings   Reproductive/Obstetrics negative OB ROS                           Anesthesia Physical Anesthesia Plan  ASA: II  Anesthesia Plan: General   Post-op Pain Management:    Induction: Intravenous  Airway Management Planned: LMA  Additional Equipment:   Intra-op Plan:   Post-operative Plan: Extubation in OR  Informed Consent: I have reviewed the patients History and Physical, chart, labs and discussed the procedure including the risks, benefits and alternatives for the proposed anesthesia with the patient or authorized representative who has indicated his/her understanding and acceptance.   Dental advisory given  Plan Discussed with: CRNA  Anesthesia Plan Comments:         Anesthesia Quick Evaluation

## 2013-05-23 NOTE — Transfer of Care (Signed)
Immediate Anesthesia Transfer of Care Note  Patient: Cameron [name].  Procedure(s) Performed: Procedure(s): TRANSURETHRAL RESECTION OF THE PROSTATE WITH GYRUS INSTRUMENTS (N/A)  Patient Location: PACU  Anesthesia Type:General  Level of Consciousness: awake, alert , oriented and patient cooperative  Airway & Oxygen Therapy: Patient Spontanous Breathing and Patient connected to face mask oxygen  Post-op Assessment: Report given to PACU RN  Post vital signs: Reviewed and stable  Complications: No apparent anesthesia complications

## 2013-05-23 NOTE — Interval H&P Note (Signed)
History and Physical Interval Note:  05/23/2013 8:59 AM  Cameron [name].  has presented today for surgery, with the diagnosis of BPH  The various methods of treatment have been discussed with the patient and family. After consideration of risks, benefits and other options for treatment, the patient has consented to  Procedure(s): TRANSURETHRAL RESECTION OF THE PROSTATE WITH GYRUS INSTRUMENTS (N/A) as a surgical intervention .  The patient's history has been reviewed, patient examined, no change in status, stable for surgery.  I have reviewed the patient's chart and labs.  Questions were answered to the patient's satisfaction.     Carolan Clines I

## 2013-05-23 NOTE — H&P (Signed)
tive Problems Problems  1. Benign prostatic hyperplasia with urinary obstruction (600.01,599.69)   Assessed By: Carolan Clines (Urology); Last Assessed: 06 May 2013 2. Elevated prostate specific antigen (PSA) (790.93) 3. Erectile dysfunction due to arterial insufficiency (607.84) 4. Nocturia (788.43) 5. Urinary frequency (788.41)   Assessed By: Carolan Clines (Urology); Last Assessed: 06 May 2013  History of Present Illness     80 yo male returns today to review pathology after prostate biopsy on 05/01/13. Hx of BPH and elevated PSA. He is currently on double dose Tamsulosin. His IPSS= 22.       He is status post prostate biopsy in November 2007, showing atypical tissue in the apex. He is also status post transurethral needle ablation (TUNA) therapy for benign prostatic hyperplasia in 2004.      He has previously noted that he has weakened urinary stream, and sensation of incomplete emptying but was not bothered enough to want further treatment for his LUTS. He has had Proscar in the past but this was stopped due to breast pain.    03/12/13 PSA - 6.54/31%  03/06/12 PSA - 6.10/31%  03/03/11 PSA - 5.47/30%   12-16-08 PSA-5.63/19.2% IPSS= 19 (pt does not think that is significant.)   Past Medical History Problems  1. History of Arthritis (V13.4)  Surgical History Problems  1. History of Hand Surgery 2. History of Knee Surgery 3. History of Surg Prostate Transureth Dest Tissue Microwave Thermotherapy  Current Meds 1. Adult Aspirin Low Strength 81 MG CHEW;  Therapy: (Recorded:13May2008) to Recorded 2. Advil TABS;  Therapy: (Recorded:13May2008) to Recorded 3. Co Q 10 CAPS;  Therapy: (Recorded:19Nov2013) to Recorded 4. Flaxseed Oil CAPS;  Therapy: (Recorded:20Nov2012) to Recorded 5. Glucosamine CAPS;  Therapy: (Recorded:13May2008) to Recorded 6. Simvastatin TABS;  Therapy: (Recorded:17Nov2009) to Recorded 7. Tamsulosin HCl - 0.4 MG Oral Capsule; TAKE 2  CAPSULE Bedtime;  Therapy: [id number] to (Evaluate:02Aug2015)  Requested for: 07Aug2014; Last  Rx:07Aug2014 Ordered 8. Vitamin D TABS;  Therapy: (Recorded:19Nov2013) to Recorded  Allergies Medication  1. No Known Drug Allergies  Family History Problems  1. Family history of Family Health Status Number Of Children   three sons and one daughter  Social History Problems  1. Alcohol Use   1 a DAY 2. Caffeine Use   2 cuops of caffeine a day 3. Former smoker [place])   smoked 1/2 ppd for 15 yrs & quit in 1979 4. Marital History - Currently Married 5. Occupation:   retired  Review of Systems Genitourinary, constitutional and gastrointestinal system(s) were reviewed and pertinent findings if present are noted.  Genitourinary: urinary frequency, feelings of urinary urgency, nocturia, weak urinary stream, urinary stream starts and stops and incomplete emptying of bladder, but initiating urination does not require straining.    Vitals Vital Signs [Data Includes: Last 1 Day]  Recorded: 12Jan2015 03:52PM  Blood Pressure: 126 / 69 Temperature: 98 F Heart Rate: 106  Physical Exam Constitutional: Well nourished and well developed . No acute distress.  ENT:. The ears and nose are normal in appearance.  Neck: The appearance of the neck is normal and no neck mass is present.  Pulmonary: No respiratory distress and normal respiratory rhythm and effort.  Cardiovascular: Heart rate and rhythm are normal . No peripheral edema.  Abdomen: The abdomen is soft and nontender. No masses are palpated. No CVA tenderness. No hernias are palpable. No hepatosplenomegaly noted.  Rectal: Estimated prostate size is 4+. Normal rectal tone, no rectal masses, prostate is smooth, symmetric and non-tender.  Genitourinary: Examination of the penis demonstrates no discharge, no masses, no lesions and a normal meatus. The scrotum is without lesions. The right epididymis is palpably normal and non-tender. The  left epididymis is palpably normal and non-tender. The right testis is non-tender and without masses. The left testis is non-tender and without masses.  Lymphatics: The femoral and inguinal nodes are not enlarged or tender.  Skin: Normal skin turgor, no visible rash and no visible skin lesions.  Neuro/Psych:. Mood and affect are appropriate.    Assessment Assessed  1. Benign prostatic hyperplasia with urinary obstruction (600.01,599.69) 2. Elevated prostate specific antigen (PSA) (790.93) 3. Erectile dysfunction due to arterial insufficiency (607.84) 4. Nocturia (788.43) 5. Urinary frequency (788.41)  BPH with negative biopsies. He needs [facility] TURP.   Discussion/Summary cc: Dr. Morrie Sheldon, [facility].     Signatures Electronically signed by : Carolan Clines, M.D.; May 06 2013  4:32PM EST

## 2013-05-23 NOTE — Op Note (Signed)
Pre-operative diagnosis :   BPH  Postoperative diagnosis:  Same  Operation:  Cystourethroscopy, TURP  Surgeon:  S. Gaynelle Arabian, MD  First assistant:  None  Anesthesia:  General LMA  Preparation: After appropriate preanesthesia, the patient was brought to the operating room, placed on the operating table in the dorsal supine position where general LMA anesthesia was introduced. History was double checked. He was placed in the dorsal lithotomy position and the penis and pubis were prepped with Betadine solution and draped in usual fashion. B. and O. suppository was placed. Review history:  Problems  1. Benign prostatic hyperplasia with urinary obstruction (600.01,599.69)   Assessed By: Carolan Clines (Urology); Last Assessed: 06 May 2013  2. Elevated prostate specific antigen (PSA) (790.93)  3. Erectile dysfunction due to arterial insufficiency (607.84)  4. Nocturia (788.43)  5. Urinary frequency (788.41)   Assessed By: Carolan Clines (Urology); Last Assessed: 06 May 2013  History of Present Illness  80 yo male returns today to review pathology after prostate biopsy on 05/01/13. Hx of BPH and elevated PSA. He is currently on double dose Tamsulosin. His IPSS= 22.  He is status post prostate biopsy in November 2007, showing atypical tissue in the apex. He is also status post transurethral needle ablation (TUNA) therapy for benign prostatic hyperplasia in 2004.  He has previously noted that he has weakened urinary stream, and sensation of incomplete emptying but was not bothered enough to want further treatment for his LUTS. He has had Proscar in the past but this was stopped due to breast pain.      Statement of  Likelihood of Success: Excellent. TIME-OUT observed.:  Procedure:  Cystourethroscopy showed massive trilobar BPH with elevated median lobe. Trabeculation and some formation was identified within the bladder. No diverticular formation was seen, however. There was no  bladder stone seen. The trigone was normal and clear reflux is seen from both orifices.  Resection was accomplished from the 7:00 to the 5:00 position, and then from the 11:00 to the 7:00 position. Resection was accomplished from the 1:00 to the 5:00 position. Chips were evacuated from the bladder. A timeout was then performed, and is a specimen timeout, and the tissue was handed to the pack for processing. This was locked in the computer.  Cystoscopy was accomplished, and more hemo-coagulation was accomplished. Further Chip irrigation was accomplished, and this was admitted to the lag bit of chips removed. A 22 French catheter with 30 cc balloon was placed without difficulty, and traction and CBI was placed. The patient was awakened taken recovery room in good condition.

## 2013-05-23 NOTE — Anesthesia Postprocedure Evaluation (Signed)
  Anesthesia Post-op Note  Patient: Cameron [name].  Procedure(s) Performed: Procedure(s) (LRB): TRANSURETHRAL RESECTION OF THE PROSTATE WITH GYRUS INSTRUMENTS (N/A)  Patient Location: PACU  Anesthesia Type: General  Level of Consciousness: awake and alert   Airway and Oxygen Therapy: Patient Spontanous Breathing  Post-op Pain: mild  Post-op Assessment: Post-op Vital signs reviewed, Patient's Cardiovascular Status Stable, Respiratory Function Stable, Patent Airway and No signs of Nausea or vomiting  Last Vitals:  Filed Vitals:   05/23/13 1443  BP: 121/60  Pulse: 62  Temp: 36.3 C  Resp: 15    Post-op Vital Signs: stable   Complications: No apparent anesthesia complications

## 2013-05-24 ENCOUNTER — Encounter (HOSPITAL_COMMUNITY): Payer: Self-pay | Admitting: Urology

## 2013-05-24 MED ORDER — TAMSULOSIN HCL 0.4 MG PO CAPS: 0.4000 mg | ORAL_CAPSULE | Freq: Every day | ORAL | Status: DC

## 2013-05-24 MED ORDER — CIPROFLOXACIN HCL 500 MG PO TABS: 500.0000 mg | ORAL_TABLET | Freq: Two times a day (BID) | ORAL | Status: DC

## 2013-05-24 MED ORDER — TRAMADOL-ACETAMINOPHEN 37.5-325 MG PO TABS: 1.0000 | ORAL_TABLET | Freq: Four times a day (QID) | ORAL | Status: DC | PRN

## 2013-05-24 MED ORDER — PHENAZOPYRIDINE HCL 200 MG PO TABS: 200.0000 mg | ORAL_TABLET | Freq: Three times a day (TID) | ORAL | Status: DC | PRN

## 2013-05-24 MED ORDER — URELLE 81 MG PO TABS: 1.0000 | ORAL_TABLET | Freq: Three times a day (TID) | ORAL | Status: DC

## 2013-05-24 NOTE — Discharge Summary (Addendum)
  Physician Discharge Summary  Patient ID: Cameron [name]. MRN: [id number] DOB/AGE: February 21, 1933 80 y.o.  Admit date: 05/23/2013 Discharge date: 05/24/2013  Admission Diagnoses: BPH  Discharge Diagnoses:  Active Problems:   Benign prostatic hypertrophy   Discharged Condition: stable  Hospital Course:   TURP  Consults: None  Significant Diagnostic Studies: No results found.  Treatments: IV hydration  Discharge Exam: Blood pressure 100/50, pulse 61, temperature 97.7 F (36.5 C), temperature source Oral, resp. rate 18, height 5' 10.5" (1.791 m), weight 77.656 kg (171 lb 3.2 oz), SpO2 96.00%. General appearance: alert and cooperative  Disposition: 06-Home-Health Care Svc   Future Appointments Provider Department Dept Phone   07/30/2013 8:00 AM [facility] [phone]   08/08/2013 11:00 AM [name], MD [place] [phone]       Medication List    ASK your doctor about these medications       aspirin EC 81 MG tablet  Take 81 mg by mouth daily.     cholecalciferol 1000 UNITS tablet  Commonly known as:  VITAMIN D  Take 1,000 Units by mouth daily. Vit d 3     Coenzyme Q10 200 MG capsule  Take 200 mg by mouth every other day.     EPINEPHrine 0.3 mg/0.3 mL Soaj injection  Commonly known as:  EPIPEN  Inject 0.3 mLs (0.3 mg total) into the muscle once.     Flax Seed Oil 1000 MG Caps  Take 1 capsule by mouth daily.     glucosamine-chondroitin 500-400 MG tablet  Take 1 tablet by mouth daily.     simvastatin 20 MG tablet  Commonly known as:  ZOCOR  Take 20 mg by mouth every evening.     tamsulosin 0.4 MG Caps capsule  Commonly known as:  FLOMAX  Take 0.4 mg by mouth 2 (two) times daily.           Follow-up Information   Follow up with Carolan Clines I, MD. (per appointment. Possibly Monday for catheter removal)    Specialty:  Urology   Contact information:   [facility]  [address] [place] [address] [phone]     RTC : Dr. Gaynelle Arabian for follow-up  Signed: Carolan Clines I 05/24/2013, 9:02 AM

## 2013-05-24 NOTE — Progress Notes (Signed)
UR completed 

## 2013-05-24 NOTE — Discharge Instructions (Signed)
Post transurethral resection of the prostate (TURP) instructions ° °Your recent prostate surgery requires very special post hospital care. Despite the fact that no skin incisions were used the area around the prostate incision is quite raw and is covered with a scab to promote healing and prevent bleeding. Certain cautions are needed to assure that the scab is not disturbed of the next 2-3 weeks while the healing proceeds. ° °Because the raw surface in your prostate and the irritating effects of urine you may expect frequency of urination and/or urgency (a stronger desire to urinate) and perhaps even getting up at night more often. This will usually resolve or improve slowly over the healing period. You may see some blood in your urine over the first 6 weeks. Do not be alarmed, even if the urine was clear for a while. Get off your feet and drink lots of fluids until clearing occurs. If you start to pass clots or don't improve call us. ° °Diet: ° °You may return to your normal diet immediately. Because of the raw surface of your bladder, alcohol, spicy foods, foods high in acid and drinks with caffeine may cause irritation or frequency and should be used in moderation. To keep your urine flowing freely and avoid constipation, drink plenty of fluids during the day (8-10 glasses). Tip: Avoid cranberry juice because it is very acidic. ° °Activity: ° °Your physical activity doesn't need to be restricted. However, if you are very active, you may see some blood in the urine. We suggest that you reduce your activity under the circumstances until the bleeding has stopped. ° °Bowels: ° °It is important to keep your bowels regular during the postoperative period. Straining with bowel movements can cause bleeding. A bowel movement every other day is reasonable. Use a mild laxative if needed, such as milk of magnesia 2-3 tablespoons, or 2 Dulcolax tablets. Call if you continue to have problems. If you had been taking narcotics  for pain, before, during or after your surgery, you may be constipated. Take a laxative if necessary. ° °Medication: ° °You should resume your pre-surgery medications unless told not to. In addition you may be given an antibiotic to prevent or treat infection. Antibiotics are not always necessary. All medication should be taken as prescribed until the bottles are finished unless you are having an unusual reaction to one of the drugs. ° ° ° ° °Problems you should report to us: ° °a. Fever greater than 101°F. °b. Heavy bleeding, or clots (see notes above about blood in urine). °c. Inability to urinate. °d. Drug reactions (hives, rash, nausea, vomiting, diarrhea). °e. Severe burning or pain with urination that is not improving. ° °

## 2013-05-24 NOTE — Progress Notes (Signed)
Urology Progress Note  1 Day Post-Op   Subjective: Post TURP. Large gland. Overnight CBI and traction. Clot irrigated last night. Pt. Uncomfortable going home today-wants to stay until tomorrow.     No acute urologic events overnight. Ambulation:   positive Flatus:    positive Bowel movement  negative  Pain: some relief  Objective:  Blood pressure 100/50, pulse 61, temperature 97.7 F (36.5 C), temperature source Oral, resp. rate 18, height 5' 10.5" (1.791 m), weight 77.656 kg (171 lb 3.2 oz), SpO2 96.00%.  Physical Exam:  General:  No acute distress, awake Extremities: extremities normal, atraumatic, no cyanosis or edema Genitourinary:  Normal penis. Bladder not distended.  Foley: yes. 3-way.     I/O last 3 completed shifts: In: 30475.8 [I.V.:1875.8; Other:28600] Out: [phone] [JOITG:54982; Blood:30]  No results found for this basename: HGB, WBC, PLT,  in the last 72 hours  No results found for this basename: NA, K, CL, CO2, BUN, CREATININE, CALCIUM, MAGNESIUM, GFRNONAA, GFRAA,  in the last 72 hours   No results found for this basename: PT, INR, APTT,  in the last 72 hours   No components found with this basename: ABG,   Assessment/Plan:  Catheter not removed. D/c DBI and traction. Ambulate and shower. Possible d/c in AM. Prine scripts.

## 2013-05-25 NOTE — Progress Notes (Signed)
Patient ID: Cameron [name]., male   DOB: May 23, 1932, 80 y.o.   MRN: [id number]  2 Days Post-Op Subjective: Pt doing well.  S/P TURP. No complaints.  Objective: Vital signs in last 24 hours: Temp:  [98.2 F (36.8 C)-98.3 F (36.8 C)] 98.2 F (36.8 C) (01/31 0500) Pulse Rate:  [74-75] 75 (01/31 0500) Resp:  [18] 18 (01/31 0500) BP: (111-122)/(46-76) 122/76 mmHg (01/31 0500) SpO2:  [96 %-100 %] 98 % (01/31 0500)  Intake/Output from previous day: 01/30 0701 - 01/31 0700 In: 3058.3 [P.O.:720; I.V.:838.3] Out: 4400 [Urine:4400] Intake/Output this shift:    Physical Exam:  General: Alert and oriented Abd: Soft GU: Urine still red but draining well.  Assessment/Plan: 1) S/P TURP: Will discharge with catheter.  Pt to call office on Monday to discuss possible voiding trial. Postoperative instructions reviewed.   LOS: 2 days   Adilynne Fitzwater,LES 05/25/2013, 7:44 AM

## 2013-05-28 DIAGNOSIS — R339 Retention of urine, unspecified: Secondary | ICD-10-CM | POA: Diagnosis not present

## 2013-05-28 DIAGNOSIS — R310 Gross hematuria: Secondary | ICD-10-CM | POA: Diagnosis not present

## 2013-06-14 DIAGNOSIS — N139 Obstructive and reflux uropathy, unspecified: Secondary | ICD-10-CM | POA: Diagnosis not present

## 2013-06-14 DIAGNOSIS — N401 Enlarged prostate with lower urinary tract symptoms: Secondary | ICD-10-CM | POA: Diagnosis not present

## 2013-06-14 DIAGNOSIS — N138 Other obstructive and reflux uropathy: Secondary | ICD-10-CM | POA: Diagnosis not present

## 2013-06-14 DIAGNOSIS — N3000 Acute cystitis without hematuria: Secondary | ICD-10-CM | POA: Diagnosis not present

## 2013-06-20 ENCOUNTER — Other Ambulatory Visit: Payer: Self-pay | Admitting: Orthopedic Surgery

## 2013-06-24 ENCOUNTER — Encounter (HOSPITAL_BASED_OUTPATIENT_CLINIC_OR_DEPARTMENT_OTHER): Payer: Self-pay | Admitting: *Deleted

## 2013-06-24 NOTE — Progress Notes (Signed)
No labs needed-just had TURP 1/15-did well

## 2013-06-26 NOTE — H&P (Signed)
Cameron [name]. is an 80 y.o. male.   Chief Complaint: c/o chronic and progressive numbness and tingling of the left hand HPI: Cameron [name] is a well known patient who presents for follow-up evaluation of his bilateral carpal tunnel syndrome.  He postponed surgery for carpal tunnel release due to his need for a left total knee arthroplasty.  He has had recurrent bilateral very significant carpal tunnel syndrome  Past Medical History  Diagnosis Date  . BPH (benign prostatic hyperplasia)   . Osteoarthrosis and allied disorders   . Erectile dysfunction   . Other and unspecified hyperlipidemia   . Elevated PSA   . Inguinal hernia recurrent unilateral   . Gilberts syndrome     elevated bile on liver tests in past  . Arthritis   . Cold early jan 2015    no cough or cold now  . Nocturia   . Rotator cuff tear, left     partial  . Hyperlipidemia   . Wears glasses     Past Surgical History  Procedure Laterality Date  . Prostate surgery  2004    tuna done  . Tonsillectomy      as child  . Right hand  1987    tendon repair   . Knee arthroscopy      bilateral  . Total knee arthroplasty  05/16/2012    Procedure: TOTAL KNEE ARTHROPLASTY;  Surgeon: Tobi Bastos, MD;  Location: [facility];  Service: Orthopedics;  Laterality: Left;  . Transurethral resection of prostate N/A 05/23/2013    Procedure: TRANSURETHRAL RESECTION OF THE PROSTATE WITH GYRUS INSTRUMENTS;  Surgeon: Ailene Rud, MD;  Location: [facility];  Service: Urology;  Laterality: N/A;    Family History  Problem Relation Age of Onset  . COPD Mother   . Heart disease Father   . Heart attack Father   . Cancer Maternal Grandfather   . Heart attack Paternal Grandfather   . Hypertension Sister   . Hypertension Sister    Social History:  reports that he quit smoking about 30 years ago. His smoking use included Pipe. He has never used smokeless tobacco. He reports that he drinks about 0.6 ounces of alcohol per week. He  reports that he does not use illicit drugs.  Allergies:  Allergies  Allergen Reactions  . Bee Venom Swelling    No prescriptions prior to admission    No results found for this or any previous visit (from the past 48 hour(s)).  No results found.   Pertinent items are noted in HPI.  Height 5' 10.5" (1.791 m), weight 77.111 kg (170 lb).  General appearance: alert Head: Normocephalic, without obvious abnormality Neck: supple, symmetrical, trachea midline Resp: clear to auscultation bilaterally Cardio: regular rate and rhythm GI: normal findings: bowel sounds normal Extremities: He returns requesting injection on the left side.  We reviewed his electrodiagnostic studies from June of 2013. He has moderate-severe bilateral carpal tunnel syndrome.  He would benefit from carpal tunnel release.   Pulses: 2+ and symmetric Skin: normal Neurologic: Grossly normal    Assessment/Plan Impression:  Left CTS  Plan: To the OR for left CTR.The procedure, risks,benefits and post-op course were discussed with the patient at length and they were in agreement with the plan.  DASNOIT,Jasma Seevers J 06/26/2013, 4:09 PM  H&P documentation: 06/27/2013  -History and Physical Reviewed  -Patient has been re-examined  -No change in the plan of care  Cammie Sickle, MD

## 2013-06-27 ENCOUNTER — Encounter (HOSPITAL_BASED_OUTPATIENT_CLINIC_OR_DEPARTMENT_OTHER)
Admission: RE | Disposition: A | Discharge status: Home / Self Care | Payer: Self-pay | Source: Ambulatory Visit | Attending: Orthopedic Surgery

## 2013-06-27 ENCOUNTER — Ambulatory Visit (HOSPITAL_BASED_OUTPATIENT_CLINIC_OR_DEPARTMENT_OTHER)
Admission: RE | Admit: 2013-06-27 | Discharge: 2013-06-27 | Disposition: A | Discharge status: Home / Self Care | Payer: Medicare Other | Source: Ambulatory Visit | Attending: Orthopedic Surgery | Admitting: Orthopedic Surgery

## 2013-06-27 ENCOUNTER — Ambulatory Visit (HOSPITAL_BASED_OUTPATIENT_CLINIC_OR_DEPARTMENT_OTHER): Payer: Medicare Other | Admitting: Anesthesiology

## 2013-06-27 ENCOUNTER — Encounter (HOSPITAL_BASED_OUTPATIENT_CLINIC_OR_DEPARTMENT_OTHER): Payer: Medicare Other | Admitting: Anesthesiology

## 2013-06-27 ENCOUNTER — Encounter (HOSPITAL_BASED_OUTPATIENT_CLINIC_OR_DEPARTMENT_OTHER): Payer: Self-pay | Admitting: *Deleted

## 2013-06-27 DIAGNOSIS — G5600 Carpal tunnel syndrome, unspecified upper limb: Secondary | ICD-10-CM | POA: Insufficient documentation

## 2013-06-27 DIAGNOSIS — Z96659 Presence of unspecified artificial knee joint: Secondary | ICD-10-CM | POA: Insufficient documentation

## 2013-06-27 DIAGNOSIS — Z87891 Personal history of nicotine dependence: Secondary | ICD-10-CM | POA: Diagnosis not present

## 2013-06-27 HISTORY — PX: CARPAL TUNNEL RELEASE: SHX101

## 2013-06-27 LAB — POCT HEMOGLOBIN-HEMACUE: Hemoglobin: 12.7 g/dL — ABNORMAL LOW (ref 13.0–17.0)

## 2013-06-27 SURGERY — CARPAL TUNNEL RELEASE
Anesthesia: General | Site: Wrist | Laterality: Left

## 2013-06-27 MED ORDER — OXYCODONE HCL 5 MG/5ML PO SOLN: 5.0000 mg | Freq: Once | ORAL | Status: DC | PRN

## 2013-06-27 MED ORDER — PROPOFOL 10 MG/ML IV BOLUS
INTRAVENOUS | Status: DC | PRN
  Administered 2013-06-27: 150 mg via INTRAVENOUS

## 2013-06-27 MED ORDER — METOCLOPRAMIDE HCL 5 MG/ML IJ SOLN: 10.0000 mg | Freq: Once | INTRAMUSCULAR | Status: DC | PRN

## 2013-06-27 MED ORDER — OXYCODONE HCL 5 MG PO TABS: 5.0000 mg | ORAL_TABLET | Freq: Once | ORAL | Status: DC | PRN

## 2013-06-27 MED ORDER — LIDOCAINE HCL 2 % IJ SOLN
INTRAMUSCULAR | Status: AC
  Filled 2013-06-27: qty 20

## 2013-06-27 MED ORDER — MIDAZOLAM HCL 2 MG/2ML IJ SOLN: 1.0000 mg | INTRAMUSCULAR | Status: DC | PRN

## 2013-06-27 MED ORDER — DEXAMETHASONE SODIUM PHOSPHATE 4 MG/ML IJ SOLN
INTRAMUSCULAR | Status: DC | PRN
  Administered 2013-06-27: 10 mg via INTRAVENOUS

## 2013-06-27 MED ORDER — LIDOCAINE HCL 2 % IJ SOLN
INTRAMUSCULAR | Status: DC | PRN
  Administered 2013-06-27: 2.5 mL

## 2013-06-27 MED ORDER — ONDANSETRON HCL 4 MG/2ML IJ SOLN
INTRAMUSCULAR | Status: DC | PRN
  Administered 2013-06-27: 4 mg via INTRAVENOUS

## 2013-06-27 MED ORDER — CEFAZOLIN SODIUM-DEXTROSE 2-3 GM-% IV SOLR
INTRAVENOUS | Status: AC
  Filled 2013-06-27: qty 50

## 2013-06-27 MED ORDER — HYDROMORPHONE HCL PF 1 MG/ML IJ SOLN: 0.2500 mg | INTRAMUSCULAR | Status: DC | PRN

## 2013-06-27 MED ORDER — FENTANYL CITRATE 0.05 MG/ML IJ SOLN
INTRAMUSCULAR | Status: AC
  Filled 2013-06-27: qty 2

## 2013-06-27 MED ORDER — FENTANYL CITRATE 0.05 MG/ML IJ SOLN: 50.0000 ug | INTRAMUSCULAR | Status: DC | PRN

## 2013-06-27 MED ORDER — CEFAZOLIN SODIUM-DEXTROSE 2-3 GM-% IV SOLR
INTRAVENOUS | Status: DC | PRN
  Administered 2013-06-27: 2 g via INTRAVENOUS

## 2013-06-27 MED ORDER — LIDOCAINE HCL (CARDIAC) 20 MG/ML IV SOLN
INTRAVENOUS | Status: DC | PRN
  Administered 2013-06-27: 50 mg via INTRAVENOUS

## 2013-06-27 MED ORDER — OXYCODONE-ACETAMINOPHEN 5-325 MG PO TABS: ORAL_TABLET | ORAL | Status: DC

## 2013-06-27 MED ORDER — FENTANYL CITRATE 0.05 MG/ML IJ SOLN
INTRAMUSCULAR | Status: DC | PRN
  Administered 2013-06-27: 50 ug via INTRAVENOUS

## 2013-06-27 MED ORDER — CHLORHEXIDINE GLUCONATE 4 % EX LIQD
60.0000 mL | Freq: Once | CUTANEOUS | Status: DC
Start: 2013-06-27 — End: 2013-06-27

## 2013-06-27 MED ORDER — LACTATED RINGERS IV SOLN
INTRAVENOUS | Status: DC
  Administered 2013-06-27: 20 mL/h via INTRAVENOUS

## 2013-06-27 SURGICAL SUPPLY — 39 items
BANDAGE ADH SHEER 1  50/CT (GAUZE/BANDAGES/DRESSINGS) IMPLANT
BANDAGE ELASTIC 3 VELCRO ST LF (GAUZE/BANDAGES/DRESSINGS) ×1 IMPLANT
BLADE SURG 15 STRL LF DISP TIS (BLADE) ×1 IMPLANT
BLADE SURG 15 STRL SS (BLADE) ×2
BNDG CMPR 9X4 STRL LF SNTH (GAUZE/BANDAGES/DRESSINGS) ×1
BNDG COHESIVE 3X5 TAN STRL LF (GAUZE/BANDAGES/DRESSINGS) ×1 IMPLANT
BNDG ESMARK 4X9 LF (GAUZE/BANDAGES/DRESSINGS) ×1 IMPLANT
BRUSH SCRUB EZ PLAIN DRY (MISCELLANEOUS) ×2 IMPLANT
CORDS BIPOLAR (ELECTRODE) ×1 IMPLANT
COVER MAYO STAND STRL (DRAPES) ×2 IMPLANT
COVER TABLE BACK 60X90 (DRAPES) ×2 IMPLANT
CUFF TOURNIQUET SINGLE 18IN (TOURNIQUET CUFF) ×1 IMPLANT
DECANTER SPIKE VIAL GLASS SM (MISCELLANEOUS) IMPLANT
DRAPE EXTREMITY T 121X128X90 (DRAPE) ×2 IMPLANT
DRAPE SURG 17X23 STRL (DRAPES) ×2 IMPLANT
GLOVE BIOGEL M STRL SZ7.5 (GLOVE) ×1 IMPLANT
GLOVE ORTHO TXT STRL SZ7.5 (GLOVE) ×2 IMPLANT
GLOVE SURG SS PI 7.0 STRL IVOR (GLOVE) ×1 IMPLANT
GOWN STRL REUS W/ TWL LRG LVL3 (GOWN DISPOSABLE) ×1 IMPLANT
GOWN STRL REUS W/ TWL XL LVL3 (GOWN DISPOSABLE) ×2 IMPLANT
GOWN STRL REUS W/TWL LRG LVL3 (GOWN DISPOSABLE) ×2
GOWN STRL REUS W/TWL XL LVL3 (GOWN DISPOSABLE) ×2
NEEDLE 27GAX1X1/2 (NEEDLE) ×1 IMPLANT
PACK BASIN DAY SURGERY FS (CUSTOM PROCEDURE TRAY) ×2 IMPLANT
PAD CAST 3X4 CTTN HI CHSV (CAST SUPPLIES) ×1 IMPLANT
PADDING CAST ABS 4INX4YD NS (CAST SUPPLIES) ×1
PADDING CAST ABS COTTON 4X4 ST (CAST SUPPLIES) ×1 IMPLANT
PADDING CAST COTTON 3X4 STRL (CAST SUPPLIES) ×2
SPLINT PLASTER CAST XFAST 3X15 (CAST SUPPLIES) ×5 IMPLANT
SPLINT PLASTER XTRA FASTSET 3X (CAST SUPPLIES) ×5
SPONGE GAUZE 4X4 12PLY (GAUZE/BANDAGES/DRESSINGS) ×2 IMPLANT
STOCKINETTE 4X48 STRL (DRAPES) ×2 IMPLANT
STRIP CLOSURE SKIN 1/2X4 (GAUZE/BANDAGES/DRESSINGS) ×2 IMPLANT
SUT PROLENE 3 0 PS 2 (SUTURE) ×2 IMPLANT
SYR 3ML 23GX1 SAFETY (SYRINGE) IMPLANT
SYR CONTROL 10ML LL (SYRINGE) ×1 IMPLANT
TOWEL OR 17X24 6PK STRL BLUE (TOWEL DISPOSABLE) ×2 IMPLANT
TRAY DSU PREP LF (CUSTOM PROCEDURE TRAY) ×2 IMPLANT
UNDERPAD 30X30 INCONTINENT (UNDERPADS AND DIAPERS) ×2 IMPLANT

## 2013-06-27 NOTE — Brief Op Note (Signed)
06/27/2013  9:39 AM  PATIENT:  Cameron [name].  80 y.o. male  PRE-OPERATIVE DIAGNOSIS:  LEFT CARPAL TUNNEL SYNDROME   POST-OPERATIVE DIAGNOSIS:  LEFT CARPAL TUNNEL SYNDROME   PROCEDURE:  Procedure(s): LEFT CARPAL TUNNEL RELEASE (Left)  SURGEON:  Surgeon(s) and Role:    * Cammie Sickle., MD - Primary  PHYSICIAN ASSISTANT:   ASSISTANTS: nurse  ANESTHESIA:   general  EBL:  Total I/O In: 600 [I.V.:600] Out: -   BLOOD ADMINISTERED:none  DRAINS: none   LOCAL MEDICATIONS USED:  LIDOCAINE   SPECIMEN:  No Specimen  DISPOSITION OF SPECIMEN:  N/A  COUNTS:  YES  TOURNIQUET:   Total Tourniquet Time Documented: Upper Arm (Left) - 10 minutes Total: Upper Arm (Left) - 10 minutes   DICTATION: .Other Dictation: Dictation Number [id number]  PLAN OF CARE: Discharge to home after PACU  PATIENT DISPOSITION:  PACU - hemodynamically stable.   Delay start of Pharmacological VTE agent (>24hrs) due to surgical blood loss or risk of bleeding: not applicable

## 2013-06-27 NOTE — Anesthesia Postprocedure Evaluation (Signed)
Anesthesia Post Note  Patient: Cameron [name].  Procedure(s) Performed: Procedure(s) (LRB): LEFT CARPAL TUNNEL RELEASE (Left)  Anesthesia type: General  Patient location: PACU  Post pain: Pain level controlled  Post assessment: Patient's Cardiovascular Status Stable  Last Vitals:  Filed Vitals:   06/27/13 1000  BP: 141/64  Pulse: 54  Temp:   Resp: 16    Post vital signs: Reviewed and stable  Level of consciousness: alert  Complications: No apparent anesthesia complications

## 2013-06-27 NOTE — Transfer of Care (Signed)
Immediate Anesthesia Transfer of Care Note  Patient: Cameron [name].  Procedure(s) Performed: Procedure(s): LEFT CARPAL TUNNEL RELEASE (Left)  Patient Location: PACU  Anesthesia Type:General  Level of Consciousness: awake and sedated  Airway & Oxygen Therapy: Patient Spontanous Breathing and Patient connected to face mask oxygen  Post-op Assessment: Report given to PACU RN and Post -op Vital signs reviewed and stable  Post vital signs: Reviewed and stable  Complications: No apparent anesthesia complications

## 2013-06-27 NOTE — Discharge Instructions (Addendum)

## 2013-06-27 NOTE — Anesthesia Preprocedure Evaluation (Signed)
Anesthesia Evaluation  Patient identified by MRN, date of birth, ID band Patient awake    Reviewed: Allergy & Precautions, H&P , NPO status , Patient's Chart, lab work & pertinent test results, reviewed documented beta blocker date and time   Airway Mallampati: II TM Distance: >3 FB Neck ROM: full    Dental   Pulmonary neg pulmonary ROS, former smoker,  breath sounds clear to auscultation        Cardiovascular negative cardio ROS  Rhythm:regular     Neuro/Psych negative neurological ROS  negative psych ROS   GI/Hepatic negative GI ROS, Neg liver ROS,   Endo/Other  negative endocrine ROS  Renal/GU negative Renal ROS  negative genitourinary   Musculoskeletal   Abdominal   Peds  Hematology  (+) anemia ,   Anesthesia Other Findings See surgeon's H&P   Reproductive/Obstetrics negative OB ROS                           Anesthesia Physical Anesthesia Plan  ASA: II  Anesthesia Plan: General   Post-op Pain Management:    Induction: Intravenous  Airway Management Planned: LMA  Additional Equipment:   Intra-op Plan:   Post-operative Plan:   Informed Consent: I have reviewed the patients History and Physical, chart, labs and discussed the procedure including the risks, benefits and alternatives for the proposed anesthesia with the patient or authorized representative who has indicated his/her understanding and acceptance.   Dental Advisory Given  Plan Discussed with: CRNA and Surgeon  Anesthesia Plan Comments:         Anesthesia Quick Evaluation

## 2013-06-27 NOTE — Anesthesia Procedure Notes (Signed)
Procedure Name: LMA Insertion Date/Time: 06/27/2013 9:14 AM Performed by: Terrance Mass Pre-anesthesia Checklist: Patient identified, Emergency Drugs available, Suction available and Patient being monitored Patient Re-evaluated:Patient Re-evaluated prior to inductionOxygen Delivery Method: Circle System Utilized Preoxygenation: Pre-oxygenation with 100% oxygen Intubation Type: IV induction Ventilation: Mask ventilation without difficulty LMA: LMA inserted LMA Size: 4.0 Number of attempts: 1 Airway Equipment and Method: bite block Placement Confirmation: positive ETCO2 and breath sounds checked- equal and bilateral Tube secured with: Tape Dental Injury: Teeth and Oropharynx as per pre-operative assessment

## 2013-06-27 NOTE — Op Note (Signed)
[id number]  

## 2013-06-28 ENCOUNTER — Encounter (HOSPITAL_BASED_OUTPATIENT_CLINIC_OR_DEPARTMENT_OTHER): Payer: Self-pay | Admitting: Orthopedic Surgery

## 2013-06-28 NOTE — Op Note (Signed)
NAMEDONG, NIMMONS NO.:  [id number]  MEDICAL RECORD NO.:  [id number]  LOCATION:                                 FACILITY:  PHYSICIAN:  Youlanda Mighty. Yeriel Mineo, M.D. DATE OF BIRTH:  05-09-1932  DATE OF PROCEDURE:  06/27/2013 DATE OF DISCHARGE:                              OPERATIVE REPORT   PREOPERATIVE DIAGNOSIS:  Chronic severe left carpal tunnel syndrome.  POSTOPERATIVE DIAGNOSIS:  Chronic severe left carpal tunnel syndrome.  OPERATION:  Release of left transverse carpal ligament.  OPERATING SURGEON:  Youlanda Mighty. Otillia Cordone, MD.  ASSISTANT:  Registered nurse.  ANESTHESIA:  General by LMA.  SUPERVISING ANESTHESIOLOGIST:  Jessy Oto. Albertina Parr, M.D.  INDICATIONS:  Cameron [name] is a 80 year old gentleman referred by Dr. Laurance Flatten of [place], [place] for management of hand numbness.  He has a history of responding transiently to steroid injections and splinting.  He now has persistent numbness and mild weakness of his thenar muscles.  He had no signs of stenosing tenosynovitis.  He is brought to the operating room at this time anticipating release of his left transverse carpal ligament.  Preoperatively, he was reminded of the potential risks and benefits of surgery.  Questions were invited and answered in detail both in my office and in the holding area.  His left hand was marked as the proper surgical site per protocol with a marking pen.  Preoperatively, he was interviewed by Dr. Albertina Parr who recommended general anesthesia by LMA technique.  Questions were invited and answered in detail with Dr. Albertina Parr.  PROCEDURE:  Cameron [name] was brought to room 6 of [facility] and placed supine position on the operating table.  Following the induction of general anesthesia by LMA technique under Dr. Roslynn Amble direct supervision, the left arm and hand were prepped with Betadine soap and solution, sterilely draped.  A pneumatic tourniquet was  applied to proximal left brachium.  Following exsanguination of the left arm with Esmarch bandage, the arterial tourniquet was inflated to 220 mmHg.  A 2 g of Ancef had been administered as an IV prophylactic antibiotic due to the fact that he has a total knee arthroplasty.  Warming devices were placed as well.  Following routine surgical time-out, procedure commenced with a short incision in the line of the ring finger and palm.  Subcutaneous tissues were carefully divided revealing the palmar fascia.  This split longitudinally to reveal the common sensory branch of the median nerve and the superficial palmar arch.  The canal was sounded with a [place] 4 elevator followed by sequential release of the transverse carpal ligament along its ulnar border extending into the distal forearm.  This widely opened the carpal canal.  There were no masses or other predicaments  Noted. The volar forearm fascia was released 4 cm. Above the distal wrist flexion crease.  Bleeding points along the margin of the released ligament were electrocauterized with bipolar forceps.  The wound was then repaired with intradermal 3-0 Prolene suture.  A compressive dressing was applied with a volar plaster splint maintaining the wrist in 15 degrees of dorsiflexion.  For aftercare, Cameron [name] was provided a prescription for Percocet 5  mg 1 p.o. q.4-6 hours p.r.n. pain, 20 tablets without refill.     Youlanda Mighty Evalise Abruzzese, M.D.     RVS/MEDQ  D:  06/27/2013  T:  06/28/2013  Job:  [id number]  cc:   Cameron [name], M.D.

## 2013-07-30 ENCOUNTER — Other Ambulatory Visit (INDEPENDENT_AMBULATORY_CARE_PROVIDER_SITE_OTHER): Payer: Medicare Other

## 2013-07-30 DIAGNOSIS — E785 Hyperlipidemia, unspecified: Secondary | ICD-10-CM | POA: Diagnosis not present

## 2013-07-30 DIAGNOSIS — E559 Vitamin D deficiency, unspecified: Secondary | ICD-10-CM | POA: Diagnosis not present

## 2013-07-30 DIAGNOSIS — I10 Essential (primary) hypertension: Secondary | ICD-10-CM

## 2013-07-30 LAB — POCT CBC
GRANULOCYTE PERCENT: 67.5 % (ref 37–80)
HCT, POC: 40.4 % — AB (ref 43.5–53.7)
Hemoglobin: 12.9 g/dL — AB (ref 14.1–18.1)
Lymph, poc: 1.4 (ref 0.6–3.4)
MCH, POC: 26.6 pg — AB (ref 27–31.2)
MCHC: 32 g/dL (ref 31.8–35.4)
MCV: 83.1 fL (ref 80–97)
MPV: 9 fL (ref 0–99.8)
POC GRANULOCYTE: 3.4 (ref 2–6.9)
POC LYMPH PERCENT: 28.4 %L (ref 10–50)
Platelet Count, POC: 181 10*3/uL (ref 142–424)
RBC: 4.9 M/uL (ref 4.69–6.13)
RDW, POC: 13.7 %
WBC: 5 10*3/uL (ref 4.6–10.2)

## 2013-07-30 NOTE — Progress Notes (Signed)
Pt came in for labs only 

## 2013-07-31 ENCOUNTER — Ambulatory Visit: Payer: Medicare Other | Admitting: Family Medicine

## 2013-07-31 LAB — BMP8+EGFR
BUN/Creatinine Ratio: 18 (ref 10–22)
BUN: 15 mg/dL (ref 8–27)
CALCIUM: 9.4 mg/dL (ref 8.6–10.2)
CO2: 24 mmol/L (ref 18–29)
Chloride: 102 mmol/L (ref 97–108)
Creatinine, Ser: 0.84 mg/dL (ref 0.76–1.27)
GFR calc Af Amer: 96 mL/min/{1.73_m2} (ref >59)
GFR calc non Af Amer: 83 mL/min/{1.73_m2} (ref >59)
GLUCOSE: 97 mg/dL (ref 65–99)
Potassium: 4.4 mmol/L (ref 3.5–5.2)
Sodium: 142 mmol/L (ref 134–144)

## 2013-07-31 LAB — HEPATIC FUNCTION PANEL
ALBUMIN: 4.4 g/dL (ref 3.5–4.8)
ALK PHOS: 78 IU/L (ref 39–117)
ALT: 15 IU/L (ref 0–44)
AST: 17 IU/L (ref 0–40)
BILIRUBIN TOTAL: 1.4 mg/dL — AB (ref 0.0–1.2)
Bilirubin, Direct: 0.28 mg/dL (ref 0.00–0.40)
TOTAL PROTEIN: 6.3 g/dL (ref 6.0–8.5)

## 2013-07-31 LAB — NMR, LIPOPROFILE
Cholesterol: 158 mg/dL (ref <200)
HDL Cholesterol by NMR: 48 mg/dL (ref >=40)
HDL Particle Number: 26.4 umol/L — ABNORMAL LOW (ref >=30.5)
LDL PARTICLE NUMBER: 1255 nmol/L — AB (ref <1000)
LDL Size: 20.6 nm (ref >20.5)
LDLC SERPL CALC-MCNC: 97 mg/dL (ref <100)
LP-IR Score: 40 (ref <=45)
SMALL LDL PARTICLE NUMBER: 646 nmol/L — AB (ref <=527)
Triglycerides by NMR: 66 mg/dL (ref <150)

## 2013-07-31 LAB — VITAMIN D 25 HYDROXY (VIT D DEFICIENCY, FRACTURES): Vit D, 25-Hydroxy: 44.3 ng/mL (ref 30.0–100.0)

## 2013-08-01 ENCOUNTER — Ambulatory Visit: Payer: Medicare Other | Admitting: Family Medicine

## 2013-08-08 ENCOUNTER — Encounter: Payer: Self-pay | Admitting: Family Medicine

## 2013-08-08 ENCOUNTER — Ambulatory Visit (INDEPENDENT_AMBULATORY_CARE_PROVIDER_SITE_OTHER): Payer: Medicare Other | Admitting: Family Medicine

## 2013-08-08 VITALS — BP 121/71 | HR 58 | Temp 97.6°F | Ht 70.0 in | Wt 173.0 lb

## 2013-08-08 DIAGNOSIS — D1801 Hemangioma of skin and subcutaneous tissue: Secondary | ICD-10-CM | POA: Diagnosis not present

## 2013-08-08 DIAGNOSIS — E785 Hyperlipidemia, unspecified: Secondary | ICD-10-CM | POA: Diagnosis not present

## 2013-08-08 DIAGNOSIS — N400 Enlarged prostate without lower urinary tract symptoms: Secondary | ICD-10-CM

## 2013-08-08 DIAGNOSIS — K4090 Unilateral inguinal hernia, without obstruction or gangrene, not specified as recurrent: Secondary | ICD-10-CM

## 2013-08-08 DIAGNOSIS — D649 Anemia, unspecified: Secondary | ICD-10-CM

## 2013-08-08 DIAGNOSIS — R710 Precipitous drop in hematocrit: Secondary | ICD-10-CM

## 2013-08-08 DIAGNOSIS — E559 Vitamin D deficiency, unspecified: Secondary | ICD-10-CM | POA: Diagnosis not present

## 2013-08-08 DIAGNOSIS — L821 Other seborrheic keratosis: Secondary | ICD-10-CM | POA: Diagnosis not present

## 2013-08-08 DIAGNOSIS — L570 Actinic keratosis: Secondary | ICD-10-CM | POA: Diagnosis not present

## 2013-08-08 NOTE — Patient Instructions (Addendum)
Medicare Annual Wellness Visit  [facility] and the medical providers at [facility] strive to bring you the best medical care.  In doing so we not only want to address your current medical conditions and concerns but also to detect new conditions early and prevent illness, disease and health-related problems.    Medicare offers a yearly Wellness Visit which allows our clinical staff to assess your need for preventative services including immunizations, lifestyle education, counseling to decrease risk of preventable diseases and screening for fall risk and other medical concerns.    This visit is provided free of charge (no copay) for all Medicare recipients. The clinical pharmacists at [facility] have begun to conduct these Wellness Visits which will also include a thorough review of all your medications.    As you primary medical provider recommend that you make an appointment for your Annual Wellness Visit if you have not done so already this year.  You may set up this appointment before you leave today or you may call back ([phone]) and schedule an appointment.  Please make sure when you call that you mention that you are scheduling your Annual Wellness Visit with the clinical pharmacist so that the appointment may be made for the proper length of time.       Continue current medications. Continue good therapeutic lifestyle changes which include good diet and exercise. Fall precautions discussed with patient. If an FOBT was given today- please return it to our front desk. If you are over 78 years old - you may need Prevnar 75 or the adult Pneumonia vaccine.  Avoid NSAIDs Drink plenty of fluids Continue to work with diet and exercise to get your cholesterol under better control Keep followup appointments with the dermatologist, Dr. Lucia Gaskins, and Dr. Gaynelle Arabian Return the FOBT Repeat CBC in about 4 weeks

## 2013-08-08 NOTE — Progress Notes (Signed)
Subjective:    Patient ID: [name], male    DOB: [birth year], 80 y.o.   MRN: [id number]  HPI Pt here for follow up and management of chronic medical problems. This patient has a history of osteoarthritis, BPH, erectile dysfunction, and hyperlipidemia. He also has a history of Gilbert's syndrome. The patient's recent lab work was reviewed with him at the visit today. It is of significance that his cholesterol was more elevated. He also had a hemoglobin that was lower than his usual. He will be given an FOBT to return and he'll come back in about 4 weeks and get another CBC. If his hemoglobin does not come back up in 4 weeks or if the FOBT is positive he will be scheduled for a colonoscopy. His last colonoscopy was in March of 2006.       Patient Active Problem List   Diagnosis Date Noted  . Benign prostatic hypertrophy 05/23/2013  . Vitamin D deficiency 04/11/2013  . Postoperative anemia due to acute blood loss 05/18/2012  . Gilberts syndrome   . Inguinal hernia recurrent unilateral   . Elevated PSA   . Erectile dysfunction   . hyperlipidemia   . Osteoarthrosis and allied disorders    Outpatient Encounter Prescriptions as of 08/08/2013  Medication Sig  . cholecalciferol (VITAMIN D) 1000 UNITS tablet Take 1,000 Units by mouth daily. Vit d 3  . Coenzyme Q10 200 MG capsule Take 200 mg by mouth every other day.  Marland Kitchen EPINEPHrine (EPIPEN) 0.3 mg/0.3 mL SOAJ injection Inject 0.3 mLs (0.3 mg total) into the muscle once.  Marland Kitchen ibuprofen (ADVIL,MOTRIN) 200 MG tablet Take 200 mg by mouth every 6 (six) hours as needed.  . [DISCONTINUED] oxyCODONE-acetaminophen (PERCOCET/ROXICET) 5-325 MG per tablet Take one or two tablets every 4 to 6 hours as needed for pain  . [DISCONTINUED] traMADol-acetaminophen (ULTRACET) 37.5-325 MG per tablet Take 1 tablet by mouth every 6 (six) hours as needed.    Review of Systems  Constitutional: Negative.   HENT: Negative.   Eyes: Negative.   Respiratory:  Negative.   Cardiovascular: Negative.   Gastrointestinal: Negative.   Endocrine: Negative.   Genitourinary: Negative.   Musculoskeletal: Negative.   Skin: Negative.   Allergic/Immunologic: Negative.   Neurological: Negative.   Hematological: Negative.   Psychiatric/Behavioral: Negative.        Objective:   Physical Exam  Nursing note and vitals reviewed. Constitutional: He is oriented to person, place, and time. He appears well-developed and well-nourished. No distress.  Pleasant and cooperative and younger appearing than his stated age  HENT:  Head: Normocephalic and atraumatic.  Right Ear: External ear normal.  Left Ear: External ear normal.  Nose: Nose normal.  Mouth/Throat: Oropharynx is clear and moist. No oropharyngeal exudate.  Eyes: Conjunctivae and EOM are normal. Pupils are equal, round, and reactive to light. Right eye exhibits no discharge. Left eye exhibits no discharge. No scleral icterus.  Neck: Normal range of motion. Neck supple. No thyromegaly present.  No carotid bruits  Cardiovascular: Normal rate, regular rhythm, normal heart sounds and intact distal pulses.  Exam reveals no gallop and no friction rub.   No murmur heard. At 72 per minute  Pulmonary/Chest: Effort normal and breath sounds normal. No respiratory distress. He has no wheezes. He has no rales. He exhibits no tenderness.  No axillary nodes  Abdominal: Soft. Bowel sounds are normal. He exhibits no mass. There is no tenderness. There is no rebound and no guarding.  No  inguinal nodes  Musculoskeletal: Normal range of motion. He exhibits no edema and no tenderness.  Status post left knee replacement  Lymphadenopathy:    He has no cervical adenopathy.  Neurological: He is alert and oriented to person, place, and time. He has normal reflexes.  Skin: Skin is warm and dry. No rash noted. No erythema. No pallor.  Psychiatric: He has a normal mood and affect. His behavior is normal. Judgment and thought  content normal.   BP 121/71  Pulse 58  Temp(Src) 97.6 F (36.4 C) (Oral)  Ht 5\' 10"  (1.778 m)  Wt 173 lb (78.472 kg)  BMI 24.82 kg/m2        Assessment & Plan:  1. hyperlipidemia  2. Vitamin D deficiency  3. Gilberts syndrome  4. Benign prostatic hypertrophy -Status post TURP  5. Decreased hemoglobin - POCT CBC; Future  6. Right inguinal hernia -Plans to see Dr. Lucia Gaskins soon  7. Anemia -Repeat CBC and return FOBT  Patient Instructions                       Medicare Annual Wellness Visit  [facility] and the medical providers at [facility] strive to bring you the best medical care.  In doing so we not only want to address your current medical conditions and concerns but also to detect new conditions early and prevent illness, disease and health-related problems.    Medicare offers a yearly Wellness Visit which allows our clinical staff to assess your need for preventative services including immunizations, lifestyle education, counseling to decrease risk of preventable diseases and screening for fall risk and other medical concerns.    This visit is provided free of charge (no copay) for all Medicare recipients. The clinical pharmacists at [facility] have begun to conduct these Wellness Visits which will also include a thorough review of all your medications.    As you primary medical provider recommend that you make an appointment for your Annual Wellness Visit if you have not done so already this year.  You may set up this appointment before you leave today or you may call back [phone]) and schedule an appointment.  Please make sure when you call that you mention that you are scheduling your Annual Wellness Visit with the clinical pharmacist so that the appointment may be made for the proper length of time.       Continue current medications. Continue good therapeutic lifestyle changes which include good diet and  exercise. Fall precautions discussed with patient. If an FOBT was given today- please return it to our front desk. If you are over 41 years old - you may need Prevnar 82 or the adult Pneumonia vaccine.  Avoid NSAIDs Drink plenty of fluids Continue to work with diet and exercise to get your cholesterol under better control Keep followup appointments with the dermatologist, Dr. Lucia Gaskins, and Dr. Gaynelle Arabian Return the FOBT Repeat CBC in about 4 weeks   Arrie Senate MD

## 2013-08-09 ENCOUNTER — Other Ambulatory Visit: Payer: Medicare Other

## 2013-08-09 DIAGNOSIS — Z1212 Encounter for screening for malignant neoplasm of rectum: Secondary | ICD-10-CM

## 2013-08-11 LAB — FECAL OCCULT BLOOD, IMMUNOCHEMICAL: Fecal Occult Bld: NEGATIVE

## 2013-08-12 ENCOUNTER — Encounter: Payer: Self-pay | Admitting: *Deleted

## 2013-08-12 NOTE — Progress Notes (Signed)
Quick Note:  Copy of labs sent to patient ______ 

## 2013-08-15 ENCOUNTER — Encounter (INDEPENDENT_AMBULATORY_CARE_PROVIDER_SITE_OTHER): Payer: Self-pay | Admitting: Surgery

## 2013-08-15 ENCOUNTER — Ambulatory Visit (INDEPENDENT_AMBULATORY_CARE_PROVIDER_SITE_OTHER): Payer: Medicare Other | Admitting: Surgery

## 2013-08-15 VITALS — BP 112/60 | HR 70 | Temp 98.4°F | Resp 14 | Ht 70.0 in | Wt 174.0 lb

## 2013-08-15 DIAGNOSIS — K4091 Unilateral inguinal hernia, without obstruction or gangrene, recurrent: Secondary | ICD-10-CM

## 2013-08-15 NOTE — Progress Notes (Signed)
Re:   Cameron [name]. DOB:   April 09, 1933 MRN:   [id number]  ASSESSMENT AND PLAN: 1.  Right inguinal hernia  I discussed the indications and complications of hernia surgery with the patient.  I discussed both the laparoscopic and open approach to hernia repair.  The potential risks of hernia surgery include, but are not limited to, bleeding, infection, open surgery, nerve injury, and recurrence of the hernia.  I provided the patient literature about hernia surgery.  At his age, with the hernia being stable for about 3 years, I think it is fine for him to watch this.  He is not anxious to have surgery.  He knows if the hernia gets larger or starts hurting, he will be back in touch with me.  I left his return appointment with me open.  2.  Osteroarthritis 3.  BPH  S/P TURP - Feb 2015 - [facility] 4.  Erectile dysfunction 5.  Hyperlipidemia 6.  Gilbert's syndrome  Chief Complaint  Patient presents with  . New Evaluation    eval RIH   REFERRING PHYSICIAN: Redge Gainer, MD  HISTORY OF PRESENT ILLNESS: Cameron [name]. is a 80 y.o. (DOB: 08-26-1932)  white  male whose primary care physician is Redge Gainer, MD and comes to me today for a right inguinal hernia. He is accompanied with his wife.  He has not prior history of abdominal or GI problems.  He has noticed a right inguinal hernia about 3 years.  It bothers him occasionally, but not much.  And the hernia has not changed any in size.  It easily reduces when he lays down. No history of stomach disease, liver disease, gall bladder disease, or pancreas disease.  He has had no prior abdominal surgery.   Past Medical History  Diagnosis Date  . BPH (benign prostatic hyperplasia)   . Osteoarthrosis and allied disorders   . Erectile dysfunction   . Other and unspecified hyperlipidemia   . Elevated PSA   . Inguinal hernia recurrent unilateral   . Gilberts syndrome     elevated bile on liver tests in past  . Arthritis   . Cold early  jan 2015    no cough or cold now  . Nocturia   . Rotator cuff tear, left     partial  . Hyperlipidemia   . Wears glasses       Past Surgical History  Procedure Laterality Date  . Prostate surgery  2004    tuna done  . Tonsillectomy      as child  . Right hand  1987    tendon repair   . Knee arthroscopy      bilateral  . Total knee arthroplasty  05/16/2012    Procedure: TOTAL KNEE ARTHROPLASTY;  Surgeon: Tobi Bastos, MD;  Location: [facility];  Service: Orthopedics;  Laterality: Left;  . Transurethral resection of prostate N/A 05/23/2013    Procedure: TRANSURETHRAL RESECTION OF THE PROSTATE WITH GYRUS INSTRUMENTS;  Surgeon: Ailene Rud, MD;  Location: [facility];  Service: Urology;  Laterality: N/A;  . Carpal tunnel release Left 06/27/2013    Procedure: LEFT CARPAL TUNNEL RELEASE;  Surgeon: Cammie Sickle., MD;  Location: [facility];  Service: Orthopedics;  Laterality: Left;      Current Outpatient Prescriptions  Medication Sig Dispense Refill  . aspirin 81 MG tablet Take 81 mg by mouth daily.      . cholecalciferol (VITAMIN D) 1000 UNITS tablet Take  1,000 Units by mouth daily. Vit d 3      . Coenzyme Q10 200 MG capsule Take 200 mg by mouth every other day.      Marland Kitchen EPINEPHrine (EPIPEN) 0.3 mg/0.3 mL SOAJ injection Inject 0.3 mLs (0.3 mg total) into the muscle once.  1 Device  3  . glucosamine-chondroitin 500-400 MG tablet Take 1 tablet by mouth 2 (two) times daily.      Marland Kitchen ibuprofen (ADVIL,MOTRIN) 200 MG tablet Take 200 mg by mouth every 6 (six) hours as needed.       No current facility-administered medications for this visit.      Allergies  Allergen Reactions  . Bee Venom Swelling    REVIEW OF SYSTEMS: Skin:  No history of rash.  No history of abnormal moles. Infection:  No history of hepatitis or HIV.  No history of MRSA. Neurologic:  No history of stroke.  No history of seizure.  No history of headaches. Cardiac:  No history of hypertension.  No history of heart disease.  No history of prior cardiac catheterization.  No history of seeing a cardiologist. Pulmonary:  Does not smoke cigarettes.  No asthma or bronchitis.  No OSA/CPAP.  Endocrine:  No diabetes. No thyroid disease. Gastrointestinal:  See HPI.  Last colonoscopy was March 2006. Urologic:  History of BPH.  S/P TURP Gaynelle Arabian, Feb 2015 Musculoskeletal:  Left knee replaced by Dr. Gladstone Lighter, Jan 2014.  Left carpel tunnel surgery by Dr. Daylene Katayama - Mar 2015.  He is careful with his back, though he has had no surgery on it. Hematologic:  Has elevated bilirubin. Psycho-social:  The patient is oriented.   The patient has no obvious psychologic or social impairment to understanding our conversation and plan.  SOCIAL and FAMILY HISTORY: Married. Has 4 children:  1 daughter and 3 sons.  PHYSICAL EXAM: BP 112/60  Pulse 70  Temp(Src) 98.4 F (36.9 C)  Resp 14  Ht 5\' 10"  (1.778 m)  Wt 174 lb (78.926 kg)  BMI 24.97 kg/m2  General: WN WM who is alert and generally healthy appearing.  HEENT: Normal. Pupils equal. Neck: Supple. No mass.  No thyroid mass. Lymph Nodes:  No supraclavicular or cervical nodes. Lungs: Clear to auscultation and symmetric breath sounds. Heart:  RRR. No murmur or rub. Abdomen: Soft. No mass. No tenderness. No hernia. Normal bowel sounds.  No abdominal scars.  On standing, he has a small right inguinal hernia which is reducible.  I do not feel a left inguinal hernia. Rectal: Not done. Extremities:  Good strength and ROM  in upper and lower extremities.  Scar on left knee from knee replacement. Neurologic:  Grossly intact to motor and sensory function. Psychiatric: Has normal mood and affect. Behavior is normal.   DATA REVIEWED: Epic notes and notes from Dr. Tawanna Sat office.  Alphonsa Overall, MD,  [facility], [address]  [place], [place]    [address] Phone:  [phone] FAX:  [phone]

## 2013-09-03 ENCOUNTER — Other Ambulatory Visit (INDEPENDENT_AMBULATORY_CARE_PROVIDER_SITE_OTHER): Payer: Medicare Other

## 2013-09-03 DIAGNOSIS — R710 Precipitous drop in hematocrit: Secondary | ICD-10-CM

## 2013-09-03 DIAGNOSIS — D649 Anemia, unspecified: Secondary | ICD-10-CM

## 2013-09-03 LAB — POCT CBC
GRANULOCYTE PERCENT: 74 % (ref 37–80)
HCT, POC: 43 % — AB (ref 43.5–53.7)
HEMOGLOBIN: 13.6 g/dL — AB (ref 14.1–18.1)
Lymph, poc: 1.4 (ref 0.6–3.4)
MCH: 25.1 pg — AB (ref 27–31.2)
MCHC: 31.6 g/dL — AB (ref 31.8–35.4)
MCV: 79.6 fL — AB (ref 80–97)
MPV: 8.6 fL (ref 0–99.8)
PLATELET COUNT, POC: 254 10*3/uL (ref 142–424)
POC GRANULOCYTE: 4.5 (ref 2–6.9)
POC LYMPH %: 22.2 % (ref 10–50)
RBC: 5.4 M/uL (ref 4.69–6.13)
RDW, POC: 14.8 %
WBC: 6.1 10*3/uL (ref 4.6–10.2)

## 2013-09-03 NOTE — Progress Notes (Signed)
Pt came in for labs only 

## 2013-10-01 DIAGNOSIS — N401 Enlarged prostate with lower urinary tract symptoms: Secondary | ICD-10-CM | POA: Diagnosis not present

## 2013-10-01 DIAGNOSIS — N138 Other obstructive and reflux uropathy: Secondary | ICD-10-CM | POA: Diagnosis not present

## 2013-10-01 DIAGNOSIS — N529 Male erectile dysfunction, unspecified: Secondary | ICD-10-CM | POA: Diagnosis not present

## 2013-10-01 DIAGNOSIS — R972 Elevated prostate specific antigen [PSA]: Secondary | ICD-10-CM | POA: Diagnosis not present

## 2013-10-01 DIAGNOSIS — N139 Obstructive and reflux uropathy, unspecified: Secondary | ICD-10-CM | POA: Diagnosis not present

## 2013-10-14 ENCOUNTER — Telehealth: Payer: Self-pay | Admitting: Family Medicine

## 2013-10-14 MED ORDER — DOXYCYCLINE HYCLATE 100 MG PO TABS: 100.0000 mg | ORAL_TABLET | Freq: Two times a day (BID) | ORAL | Status: DC

## 2013-10-14 NOTE — Telephone Encounter (Signed)
Doxy sent on - ok per [facility] The old script pt had was exp in 12/2012!

## 2013-12-06 DIAGNOSIS — IMO0002 Reserved for concepts with insufficient information to code with codable children: Secondary | ICD-10-CM | POA: Diagnosis not present

## 2013-12-06 DIAGNOSIS — M25529 Pain in unspecified elbow: Secondary | ICD-10-CM | POA: Diagnosis not present

## 2014-01-14 ENCOUNTER — Ambulatory Visit (INDEPENDENT_AMBULATORY_CARE_PROVIDER_SITE_OTHER): Payer: Medicare Other | Admitting: *Deleted

## 2014-01-14 DIAGNOSIS — Z111 Encounter for screening for respiratory tuberculosis: Secondary | ICD-10-CM | POA: Diagnosis not present

## 2014-01-14 NOTE — Patient Instructions (Signed)

## 2014-01-14 NOTE — Progress Notes (Signed)
Patient ID: Cameron [name]., male   DOB: May 14, 1932, 80 y.o.   MRN: [id number] Ppd skin test placed on left forearm.

## 2014-01-16 ENCOUNTER — Encounter: Payer: Self-pay | Admitting: *Deleted

## 2014-01-23 ENCOUNTER — Other Ambulatory Visit (INDEPENDENT_AMBULATORY_CARE_PROVIDER_SITE_OTHER): Payer: Medicare Other

## 2014-01-23 DIAGNOSIS — E559 Vitamin D deficiency, unspecified: Secondary | ICD-10-CM

## 2014-01-23 DIAGNOSIS — N400 Enlarged prostate without lower urinary tract symptoms: Secondary | ICD-10-CM

## 2014-01-23 DIAGNOSIS — E785 Hyperlipidemia, unspecified: Secondary | ICD-10-CM

## 2014-01-23 LAB — POCT CBC
Granulocyte percent: 69.1 %G (ref 37–80)
HCT, POC: 49.3 % (ref 43.5–53.7)
Hemoglobin: 15.6 g/dL (ref 14.1–18.1)
Lymph, poc: 1.6 (ref 0.6–3.4)
MCH: 27.9 pg (ref 27–31.2)
MCHC: 31.7 g/dL — AB (ref 31.8–35.4)
MCV: 88.1 fL (ref 80–97)
MPV: 8.8 fL (ref 0–99.8)
PLATELET COUNT, POC: 186 10*3/uL (ref 142–424)
POC Granulocyte: 4 (ref 2–6.9)
POC LYMPH %: 26.9 % (ref 10–50)
RBC: 5.6 M/uL (ref 4.69–6.13)
RDW, POC: 14.8 %
WBC: 5.8 10*3/uL (ref 4.6–10.2)

## 2014-01-23 NOTE — Progress Notes (Signed)
Lab only 

## 2014-01-24 LAB — PSA, TOTAL AND FREE
PSA FREE: 0.77 ng/mL
PSA, Free Pct: 24.1 %
PSA: 3.2 ng/mL (ref 0.0–4.0)

## 2014-01-24 LAB — BMP8+EGFR
BUN/Creatinine Ratio: 29 — ABNORMAL HIGH (ref 10–22)
BUN: 23 mg/dL (ref 8–27)
CO2: 23 mmol/L (ref 18–29)
Calcium: 9.5 mg/dL (ref 8.6–10.2)
Chloride: 99 mmol/L (ref 97–108)
Creatinine, Ser: 0.79 mg/dL (ref 0.76–1.27)
GFR calc Af Amer: 99 mL/min/1.73 (ref >59)
GFR calc non Af Amer: 85 mL/min/1.73 (ref >59)
Glucose: 100 mg/dL — ABNORMAL HIGH (ref 65–99)
Potassium: 4.4 mmol/L (ref 3.5–5.2)
Sodium: 140 mmol/L (ref 134–144)

## 2014-01-24 LAB — NMR, LIPOPROFILE
CHOLESTEROL: 165 mg/dL (ref 100–199)
HDL Cholesterol by NMR: 52 mg/dL (ref >39)
HDL Particle Number: 34 umol/L (ref >=30.5)
LDL PARTICLE NUMBER: 1078 nmol/L — AB (ref <1000)
LDL SIZE: 21 nm (ref >20.5)
LDLC SERPL CALC-MCNC: 101 mg/dL — AB (ref 0–99)
LP-IR SCORE: 35 (ref <=45)
Small LDL Particle Number: 165 nmol/L (ref <=527)
TRIGLYCERIDES BY NMR: 59 mg/dL (ref 0–149)

## 2014-01-24 LAB — VITAMIN D 25 HYDROXY (VIT D DEFICIENCY, FRACTURES): VIT D 25 HYDROXY: 53.5 ng/mL (ref 30.0–100.0)

## 2014-01-24 LAB — HEPATIC FUNCTION PANEL
ALT: 13 IU/L (ref 0–44)
AST: 13 IU/L (ref 0–40)
Albumin: 4.4 g/dL (ref 3.5–4.8)
Alkaline Phosphatase: 80 IU/L (ref 39–117)
Bilirubin, Direct: 0.27 mg/dL (ref 0.00–0.40)
Total Bilirubin: 1.4 mg/dL — ABNORMAL HIGH (ref 0.0–1.2)
Total Protein: 6.4 g/dL (ref 6.0–8.5)

## 2014-02-11 ENCOUNTER — Ambulatory Visit: Payer: Medicare Other | Admitting: Family Medicine

## 2014-02-13 ENCOUNTER — Encounter: Payer: Self-pay | Admitting: Family Medicine

## 2014-02-13 ENCOUNTER — Ambulatory Visit (INDEPENDENT_AMBULATORY_CARE_PROVIDER_SITE_OTHER): Payer: Medicare Other | Admitting: Family Medicine

## 2014-02-13 VITALS — BP 125/74 | HR 62 | Temp 97.7°F | Ht 70.0 in | Wt 167.0 lb

## 2014-02-13 DIAGNOSIS — Z23 Encounter for immunization: Secondary | ICD-10-CM

## 2014-02-13 DIAGNOSIS — N400 Enlarged prostate without lower urinary tract symptoms: Secondary | ICD-10-CM | POA: Diagnosis not present

## 2014-02-13 DIAGNOSIS — E785 Hyperlipidemia, unspecified: Secondary | ICD-10-CM

## 2014-02-13 DIAGNOSIS — M25542 Pain in joints of left hand: Secondary | ICD-10-CM

## 2014-02-13 DIAGNOSIS — R972 Elevated prostate specific antigen [PSA]: Secondary | ICD-10-CM

## 2014-02-13 DIAGNOSIS — M79642 Pain in left hand: Secondary | ICD-10-CM

## 2014-02-13 DIAGNOSIS — E559 Vitamin D deficiency, unspecified: Secondary | ICD-10-CM

## 2014-02-13 DIAGNOSIS — M25541 Pain in joints of right hand: Secondary | ICD-10-CM

## 2014-02-13 DIAGNOSIS — M79641 Pain in right hand: Secondary | ICD-10-CM

## 2014-02-13 NOTE — Patient Instructions (Addendum)
Medicare Annual Wellness Visit  [facility] and the medical providers at [facility] strive to bring you the best medical care.  In doing so we not only want to address your current medical conditions and concerns but also to detect new conditions early and prevent illness, disease and health-related problems.    Medicare offers a yearly Wellness Visit which allows our clinical staff to assess your need for preventative services including immunizations, lifestyle education, counseling to decrease risk of preventable diseases and screening for fall risk and other medical concerns.    This visit is provided free of charge (no copay) for all Medicare recipients. The clinical pharmacists at [facility] have begun to conduct these Wellness Visits which will also include a thorough review of all your medications.    As you primary medical provider recommend that you make an appointment for your Annual Wellness Visit if you have not done so already this year.  You may set up this appointment before you leave today or you may call back ([phone]) and schedule an appointment.  Please make sure when you call that you mention that you are scheduling your Annual Wellness Visit with the clinical pharmacist so that the appointment may be made for the proper length of time.     Continue current medications. Continue good therapeutic lifestyle changes which include good diet and exercise. Fall precautions discussed with patient. If an FOBT was given today- please return it to our front desk. If you are over 53 years old - you may need Prevnar 34 or the adult Pneumonia vaccine.  Flu Shots will be available at our office starting mid- September. Please call and schedule a FLU CLINIC APPOINTMENT.   Continue to exercise regularly and watch your diet Use the Band-Aid as directed--if the trigger finger problem continues, make an appointment to see the  hand surgeon Take Aleve over-the-counter after dinner on a regular basis for 7-10 days to see if it helps the arthralgias in her hands better

## 2014-02-13 NOTE — Progress Notes (Signed)
Subjective:    Patient ID: Cameron Moh., male    DOB: 1933/04/13, 80 y.o.   MRN: [id number]  HPI Pt here for follow up and management of chronic medical problems. The patient is doing well, with no specific complaints except for some arthralgias in both of his hands. He has had his lab work done and we will review this with him today. He is due to get his flu shot today. He is followed regularly by the urologist who does his prostate and rectal exam. He does not need any refills today. This patient plans to move to the friend's home in [place] by the end of December.        Patient Active Problem List   Diagnosis Date Noted  . Benign prostatic hypertrophy 05/23/2013  . Vitamin D deficiency 04/11/2013  . Postoperative anemia due to acute blood loss 05/18/2012  . Gilberts syndrome   . Inguinal hernia recurrent unilateral   . Elevated PSA   . Erectile dysfunction   . Hyperlipidemia   . Osteoarthrosis and allied disorders    Outpatient Encounter Prescriptions as of 02/13/2014  Medication Sig  . aspirin 81 MG tablet Take 81 mg by mouth daily.  . cholecalciferol (VITAMIN D) 1000 UNITS tablet Take 1,000 Units by mouth daily. Vit d 3  . Coenzyme Q10 200 MG capsule Take 200 mg by mouth every other day.  . Flaxseed, Linseed, (FLAX SEED OIL) 1000 MG CAPS Take 1 capsule by mouth daily.  Marland Kitchen glucosamine-chondroitin 500-400 MG tablet Take 1 tablet by mouth 2 (two) times daily.  Marland Kitchen ibuprofen (ADVIL,MOTRIN) 200 MG tablet Take 200 mg by mouth every 6 (six) hours as needed.  Marland Kitchen EPINEPHrine (EPIPEN) 0.3 mg/0.3 mL SOAJ injection Inject 0.3 mLs (0.3 mg total) into the muscle once.  . [DISCONTINUED] doxycycline (VIBRA-TABS) 100 MG tablet Take 1 tablet (100 mg total) by mouth 2 (two) times daily.    Review of Systems  Constitutional: Negative.   HENT: Negative.   Eyes: Negative.   Respiratory: Negative.   Cardiovascular: Negative.   Gastrointestinal: Negative.   Endocrine: Negative.     Genitourinary: Negative.   Musculoskeletal: Positive for arthralgias (in hands).  Skin: Negative.   Allergic/Immunologic: Negative.   Neurological: Negative.   Hematological: Negative.   Psychiatric/Behavioral: Negative.        Objective:   Physical Exam  Nursing note and vitals reviewed. Constitutional: He is oriented to person, place, and time. He appears well-developed and well-nourished. No distress.  HENT:  Head: Normocephalic and atraumatic.  Right Ear: External ear normal.  Left Ear: External ear normal.  Nose: Nose normal.  Mouth/Throat: Oropharynx is clear and moist. No oropharyngeal exudate.  Eyes: Conjunctivae and EOM are normal. Pupils are equal, round, and reactive to light. Right eye exhibits no discharge. Left eye exhibits no discharge. No scleral icterus.  Neck: Normal range of motion. Neck supple. No tracheal deviation present. No thyromegaly present.  Cardiovascular: Normal rate, regular rhythm, normal heart sounds and intact distal pulses.   No murmur heard. Pulmonary/Chest: Effort normal and breath sounds normal. No respiratory distress. He has no wheezes. He has no rales. He exhibits no tenderness.  Abdominal: Soft. Bowel sounds are normal. He exhibits no mass. There is no tenderness. There is no rebound and no guarding.  Genitourinary:  Continue followup with urology  Musculoskeletal: Normal range of motion. He exhibits no edema and no tenderness.  Some stiffness in both hands  Lymphadenopathy:    He has  no cervical adenopathy.  Neurological: He is alert and oriented to person, place, and time.  Skin: Skin is warm and dry. No rash noted. No erythema. No pallor.  Psychiatric: He has a normal mood and affect. His behavior is normal. Judgment and thought content normal.   BP 125/74  Pulse 62  Temp(Src) 97.7 F (36.5 C) (Oral)  Ht 5\' 10"  (1.778 m)  Wt 167 lb (75.751 kg)  BMI 23.96 kg/m2        Assessment & Plan:  1. Benign prostatic  hypertrophy  2. Elevated PSA -Continue followup with urology  3. Gilberts syndrome  4. Hyperlipidemia  5. Vitamin D deficiency  6. Arthralgia of hands, bilateral -Take Aleve as directed  7. Encounter for immunization   Patient Instructions                       Medicare Annual Wellness Visit  [facility] and the medical providers at [facility] strive to bring you the best medical care.  In doing so we not only want to address your current medical conditions and concerns but also to detect new conditions early and prevent illness, disease and health-related problems.    Medicare offers a yearly Wellness Visit which allows our clinical staff to assess your need for preventative services including immunizations, lifestyle education, counseling to decrease risk of preventable diseases and screening for fall risk and other medical concerns.    This visit is provided free of charge (no copay) for all Medicare recipients. The clinical pharmacists at [facility] have begun to conduct these Wellness Visits which will also include a thorough review of all your medications.    As you primary medical provider recommend that you make an appointment for your Annual Wellness Visit if you have not done so already this year.  You may set up this appointment before you leave today or you may call back [phone]) and schedule an appointment.  Please make sure when you call that you mention that you are scheduling your Annual Wellness Visit with the clinical pharmacist so that the appointment may be made for the proper length of time.     Continue current medications. Continue good therapeutic lifestyle changes which include good diet and exercise. Fall precautions discussed with patient. If an FOBT was given today- please return it to our front desk. If you are over 35 years old - you may need Prevnar 53 or the adult Pneumonia vaccine.  Flu Shots will be  available at our office starting mid- September. Please call and schedule a FLU CLINIC APPOINTMENT.   Continue to exercise regularly and watch your diet Use the Band-Aid as directed--if the trigger finger problem continues, make an appointment to see the hand surgeon Take Aleve over-the-counter after dinner on a regular basis for 7-10 days to see if it helps the arthralgias in her hands better    Arrie Senate MD

## 2014-03-06 DIAGNOSIS — M75112 Incomplete rotator cuff tear or rupture of left shoulder, not specified as traumatic: Secondary | ICD-10-CM | POA: Diagnosis not present

## 2014-03-28 DIAGNOSIS — M75112 Incomplete rotator cuff tear or rupture of left shoulder, not specified as traumatic: Secondary | ICD-10-CM | POA: Diagnosis not present

## 2014-04-03 DIAGNOSIS — D225 Melanocytic nevi of trunk: Secondary | ICD-10-CM | POA: Diagnosis not present

## 2014-04-03 DIAGNOSIS — D2271 Melanocytic nevi of right lower limb, including hip: Secondary | ICD-10-CM | POA: Diagnosis not present

## 2014-04-03 DIAGNOSIS — D2272 Melanocytic nevi of left lower limb, including hip: Secondary | ICD-10-CM | POA: Diagnosis not present

## 2014-04-03 DIAGNOSIS — D1801 Hemangioma of skin and subcutaneous tissue: Secondary | ICD-10-CM | POA: Diagnosis not present

## 2014-04-03 DIAGNOSIS — L821 Other seborrheic keratosis: Secondary | ICD-10-CM | POA: Diagnosis not present

## 2014-06-03 DIAGNOSIS — M65342 Trigger finger, left ring finger: Secondary | ICD-10-CM | POA: Diagnosis not present

## 2014-06-03 DIAGNOSIS — M65332 Trigger finger, left middle finger: Secondary | ICD-10-CM | POA: Diagnosis not present

## 2014-06-04 DIAGNOSIS — K4090 Unilateral inguinal hernia, without obstruction or gangrene, not specified as recurrent: Secondary | ICD-10-CM | POA: Diagnosis not present

## 2014-07-16 IMAGING — CR DG CHEST 2V
2 series · 2 of 2 positions shown · non-contrast
Comparison: None.

CLINICAL DATA: Preop evaluation for total knee replacement

CHEST - 2 VIEW

[w chest pa]
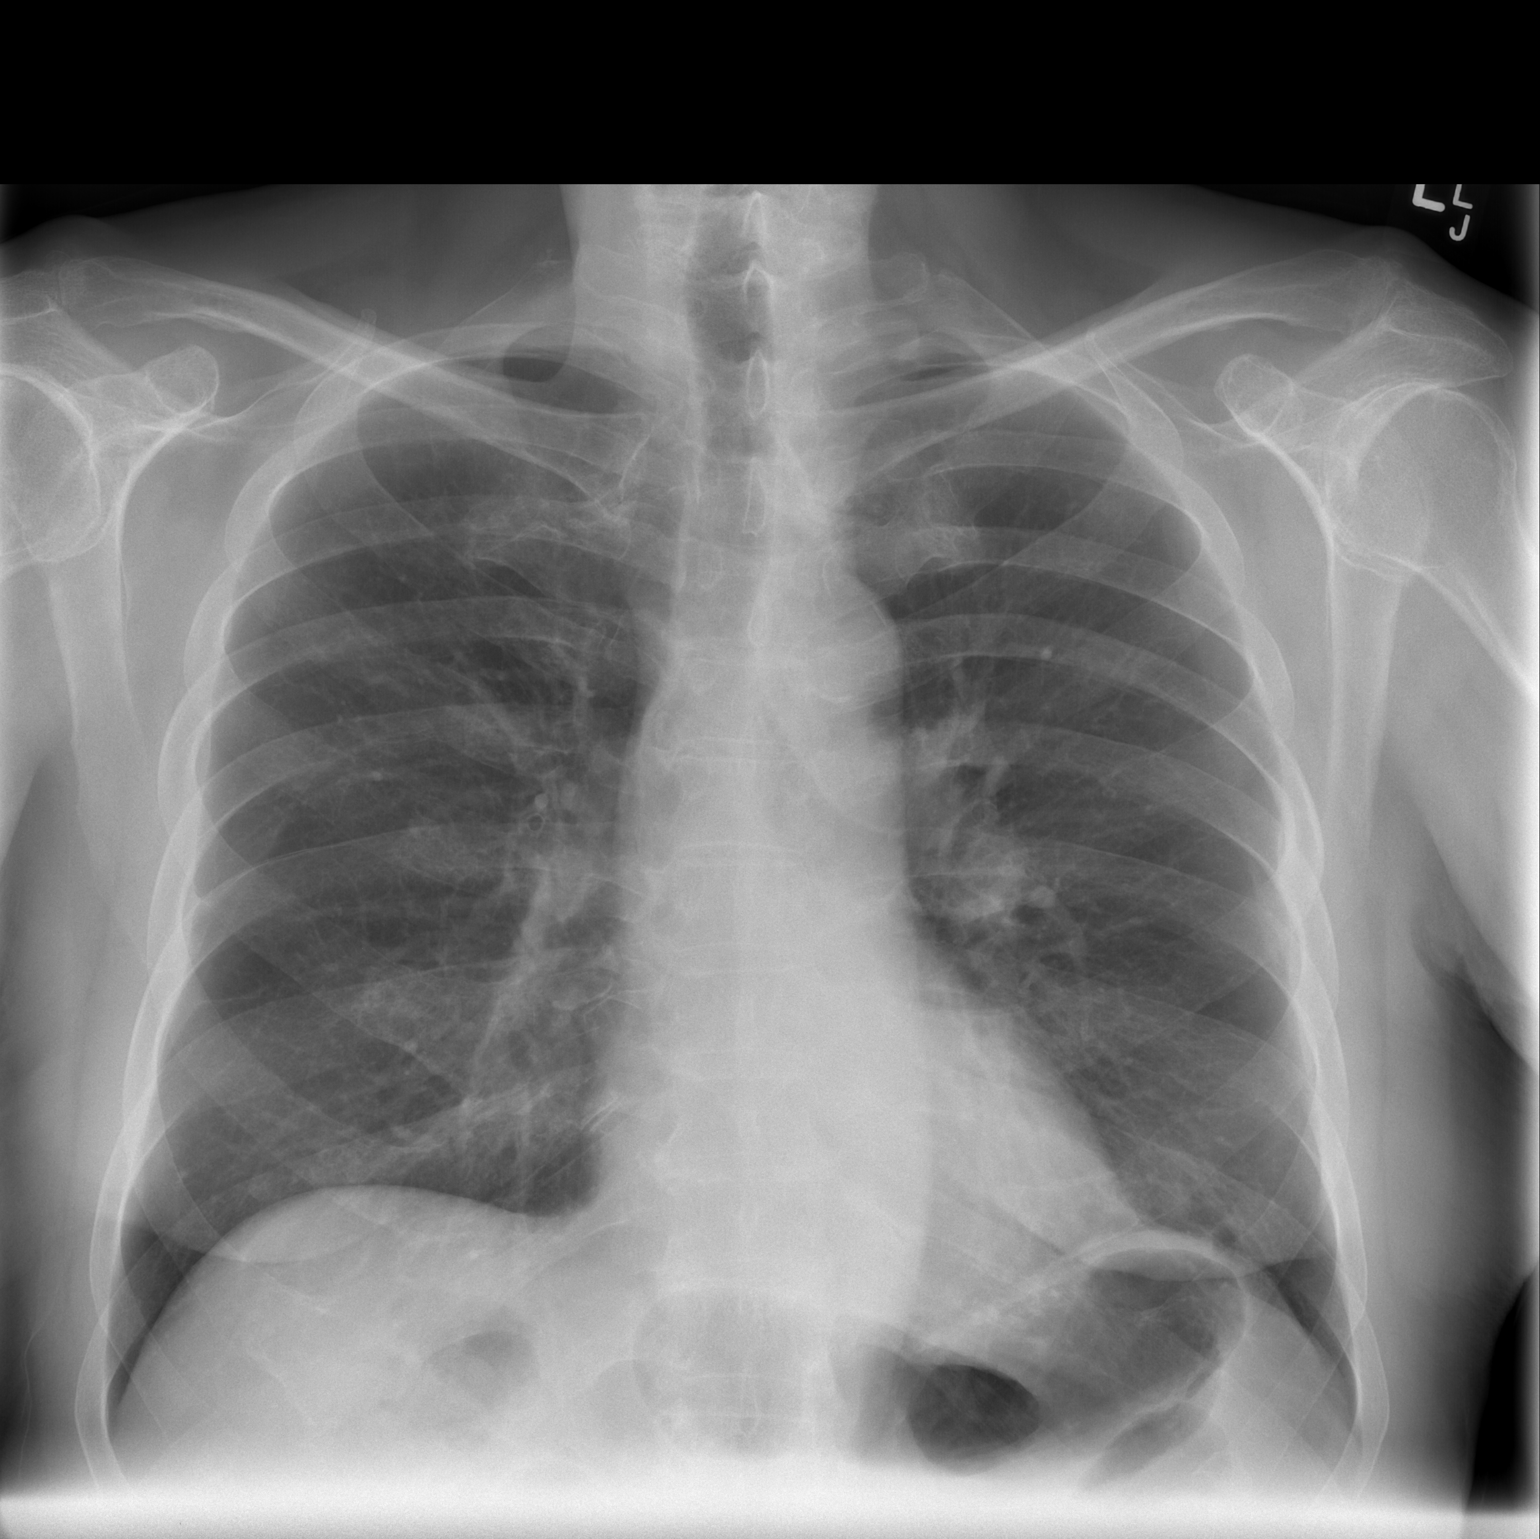

[w chest lat]
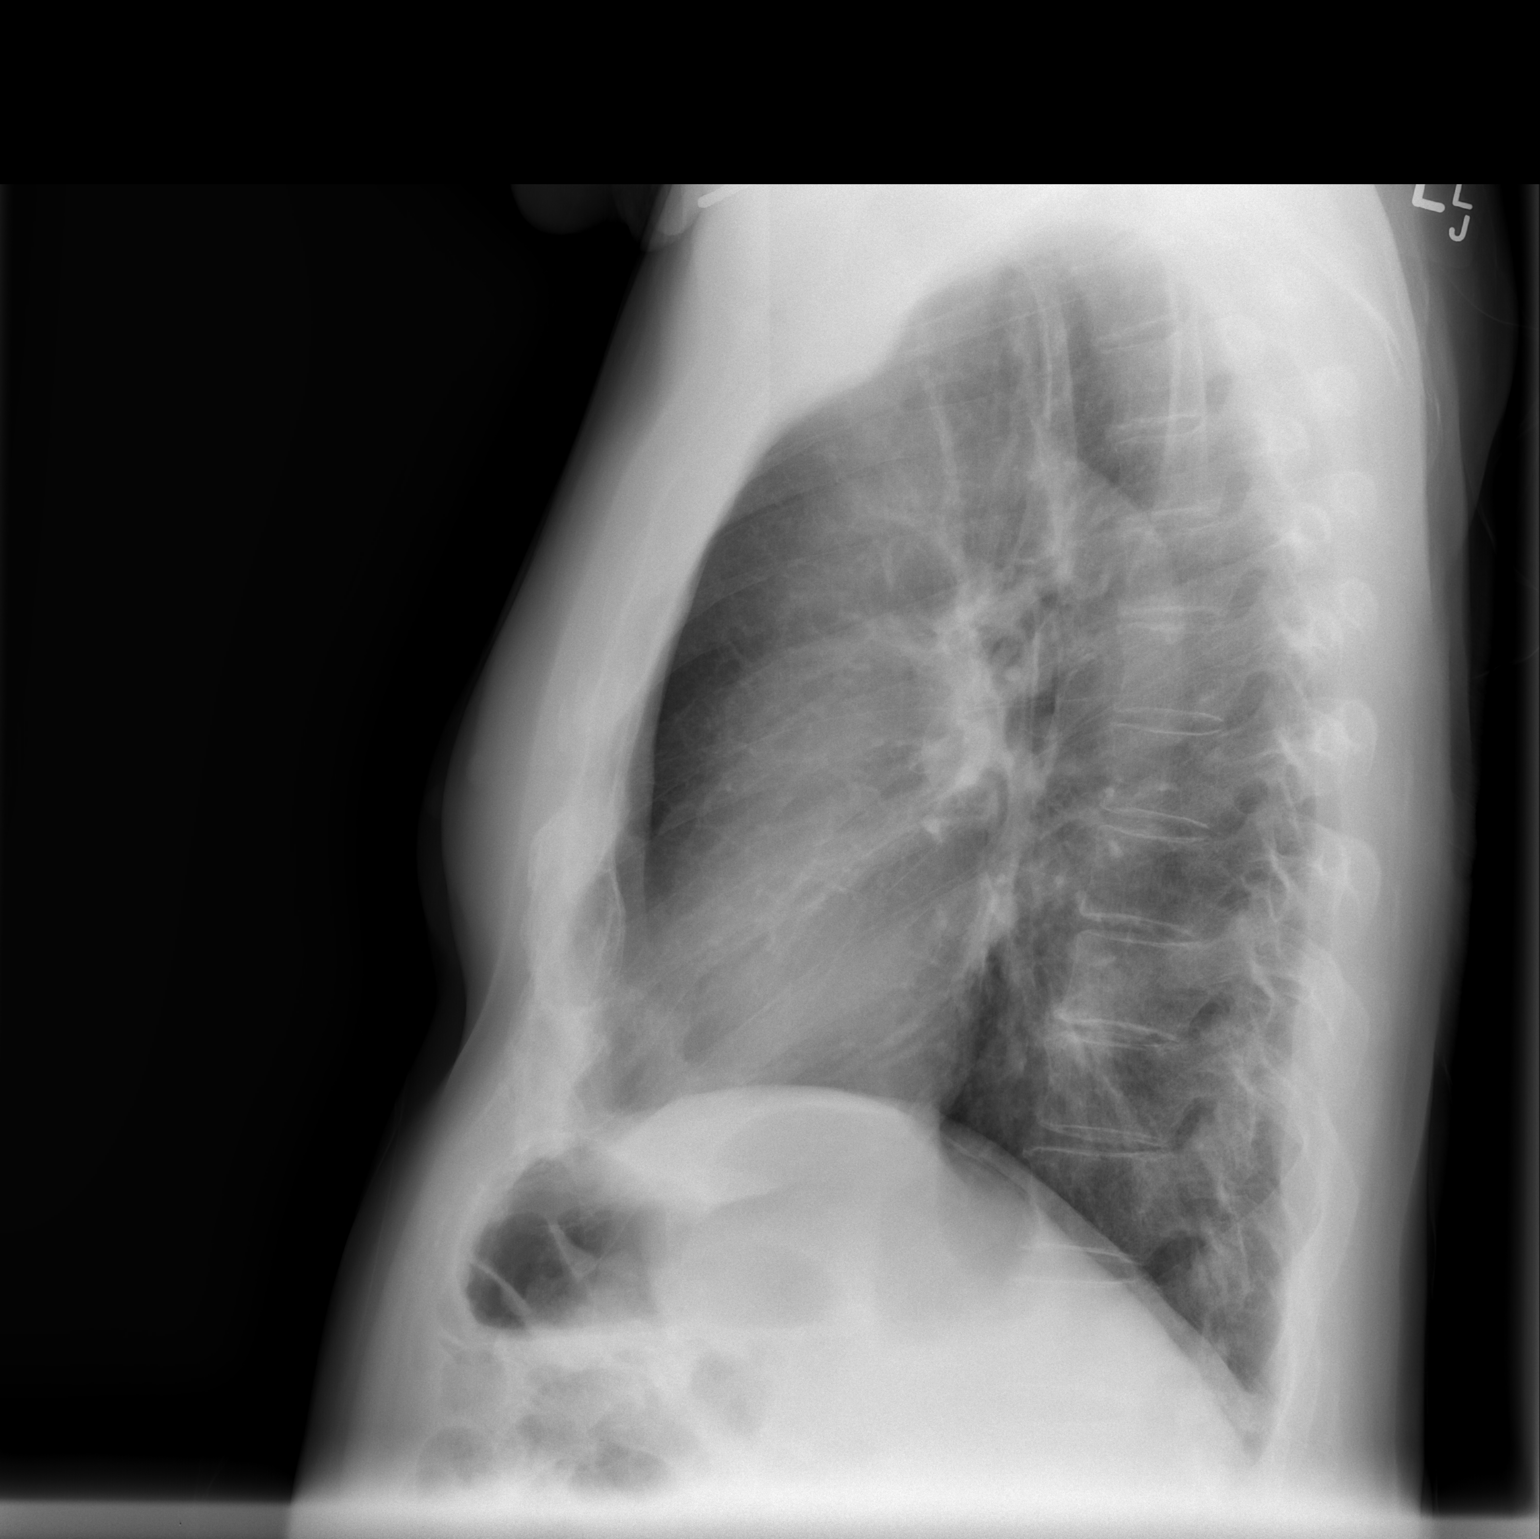

[2 of 2 positions shown; findings below may reference images not displayed]

FINDINGS: Normal heart size and vascularity.  Negative for CHF or
pneumonia.  No effusion or pneumothorax.  Lower chest nipple
shadows noted.  Trachea is midline.
IMPRESSION: No acute chest process

## 2014-08-12 ENCOUNTER — Other Ambulatory Visit (INDEPENDENT_AMBULATORY_CARE_PROVIDER_SITE_OTHER): Payer: Medicare Other

## 2014-08-12 DIAGNOSIS — R972 Elevated prostate specific antigen [PSA]: Secondary | ICD-10-CM

## 2014-08-12 DIAGNOSIS — E559 Vitamin D deficiency, unspecified: Secondary | ICD-10-CM | POA: Diagnosis not present

## 2014-08-12 DIAGNOSIS — E785 Hyperlipidemia, unspecified: Secondary | ICD-10-CM | POA: Diagnosis not present

## 2014-08-12 DIAGNOSIS — N400 Enlarged prostate without lower urinary tract symptoms: Secondary | ICD-10-CM | POA: Diagnosis not present

## 2014-08-12 LAB — POCT CBC
Granulocyte percent: 72.5 %G (ref 37–80)
HCT, POC: 50.7 % (ref 43.5–53.7)
Hemoglobin: 16 g/dL (ref 14.1–18.1)
Lymph, poc: 1.5 (ref 0.6–3.4)
MCH, POC: 29 pg (ref 27–31.2)
MCHC: 31.5 g/dL — AB (ref 31.8–35.4)
MCV: 92.1 fL (ref 80–97)
MPV: 8.7 fL (ref 0–99.8)
POC GRANULOCYTE: 4.7 (ref 2–6.9)
POC LYMPH %: 22.8 % (ref 10–50)
Platelet Count, POC: 206 10*3/uL (ref 142–424)
RBC: 5.51 M/uL (ref 4.69–6.13)
RDW, POC: 13.2 %
WBC: 6.5 10*3/uL (ref 4.6–10.2)

## 2014-08-13 ENCOUNTER — Telehealth: Payer: Self-pay | Admitting: *Deleted

## 2014-08-13 LAB — HEPATIC FUNCTION PANEL
ALBUMIN: 4.5 g/dL (ref 3.5–4.7)
ALT: 16 IU/L (ref 0–44)
AST: 14 IU/L (ref 0–40)
Alkaline Phosphatase: 77 IU/L (ref 39–117)
BILIRUBIN TOTAL: 1.5 mg/dL — AB (ref 0.0–1.2)
BILIRUBIN, DIRECT: 0.32 mg/dL (ref 0.00–0.40)
Total Protein: 6.4 g/dL (ref 6.0–8.5)

## 2014-08-13 LAB — NMR, LIPOPROFILE
CHOLESTEROL: 181 mg/dL (ref 100–199)
HDL Cholesterol by NMR: 60 mg/dL (ref >39)
HDL Particle Number: 34 umol/L (ref >=30.5)
LDL Particle Number: 1165 nmol/L — ABNORMAL HIGH (ref <1000)
LDL SIZE: 20.9 nm (ref >20.5)
LDL-C: 110 mg/dL — ABNORMAL HIGH (ref 0–99)
LP-IR Score: <25 (ref <=45)
SMALL LDL PARTICLE NUMBER: 322 nmol/L (ref <=527)
TRIGLYCERIDES BY NMR: 57 mg/dL (ref 0–149)

## 2014-08-13 LAB — BMP8+EGFR
BUN/Creatinine Ratio: 29 — ABNORMAL HIGH (ref 10–22)
BUN: 25 mg/dL (ref 8–27)
CO2: 26 mmol/L (ref 18–29)
Calcium: 9.5 mg/dL (ref 8.6–10.2)
Chloride: 101 mmol/L (ref 97–108)
Creatinine, Ser: 0.86 mg/dL (ref 0.76–1.27)
GFR calc non Af Amer: 82 mL/min/{1.73_m2} (ref >59)
GFR, EST AFRICAN AMERICAN: 95 mL/min/{1.73_m2} (ref >59)
GLUCOSE: 101 mg/dL — AB (ref 65–99)
POTASSIUM: 4.3 mmol/L (ref 3.5–5.2)
Sodium: 142 mmol/L (ref 134–144)

## 2014-08-13 LAB — VITAMIN D 25 HYDROXY (VIT D DEFICIENCY, FRACTURES): VIT D 25 HYDROXY: 45.8 ng/mL (ref 30.0–100.0)

## 2014-08-13 NOTE — Telephone Encounter (Signed)
-----   Message from [name], MD sent at 08/13/2014  9:36 AM EDT ----- The blood sugar is slightly elevated at 101. The creatinine, the most important kidney function test is within normal limits. The electrolytes including potassium are good and within normal limits. All liver function tests except for the total bilirubin are within normal limits, and the total bilirubin elevation is consistent with past readings. Cholesterol numbers with advanced lipid testing have a total LDL particle number that is more elevated than 6 months ago. It is 1165. The LDL C is also elevated at 110 and this number should be less than 100. The triglycerides are good. The good cholesterol or the HDL particle number is also good.------ when the patient comes in for his next visit we will discuss the benefits of starting Ace low-dose statin drug along with aggressive therapeutic lifestyle changes which include diet and exercise.+++++++++ The vitamin D level is good at 45.8 and he should continue with his current vitamin D treatment

## 2014-08-13 NOTE — Telephone Encounter (Signed)
lmtcb regarding test results. 

## 2014-08-14 NOTE — Progress Notes (Signed)
Patient aware.

## 2014-08-21 ENCOUNTER — Encounter: Payer: Self-pay | Admitting: Family Medicine

## 2014-08-21 ENCOUNTER — Ambulatory Visit (INDEPENDENT_AMBULATORY_CARE_PROVIDER_SITE_OTHER): Payer: Medicare Other | Admitting: Family Medicine

## 2014-08-21 ENCOUNTER — Ambulatory Visit (INDEPENDENT_AMBULATORY_CARE_PROVIDER_SITE_OTHER): Payer: Medicare Other

## 2014-08-21 VITALS — BP 124/67 | HR 59 | Temp 97.6°F | Ht 70.0 in | Wt 165.0 lb

## 2014-08-21 DIAGNOSIS — Z91038 Other insect allergy status: Secondary | ICD-10-CM | POA: Diagnosis not present

## 2014-08-21 DIAGNOSIS — H1132 Conjunctival hemorrhage, left eye: Secondary | ICD-10-CM | POA: Diagnosis not present

## 2014-08-21 DIAGNOSIS — E785 Hyperlipidemia, unspecified: Secondary | ICD-10-CM

## 2014-08-21 DIAGNOSIS — R972 Elevated prostate specific antigen [PSA]: Secondary | ICD-10-CM

## 2014-08-21 DIAGNOSIS — N400 Enlarged prostate without lower urinary tract symptoms: Secondary | ICD-10-CM | POA: Diagnosis not present

## 2014-08-21 DIAGNOSIS — E559 Vitamin D deficiency, unspecified: Secondary | ICD-10-CM | POA: Diagnosis not present

## 2014-08-21 DIAGNOSIS — T148 Other injury of unspecified body region: Secondary | ICD-10-CM

## 2014-08-21 DIAGNOSIS — W57XXXA Bitten or stung by nonvenomous insect and other nonvenomous arthropods, initial encounter: Secondary | ICD-10-CM

## 2014-08-21 DIAGNOSIS — Z91030 Bee allergy status: Secondary | ICD-10-CM

## 2014-08-21 MED ORDER — DOXYCYCLINE HYCLATE 100 MG PO TABS: 100.0000 mg | ORAL_TABLET | Freq: Two times a day (BID) | ORAL | Status: DC

## 2014-08-21 MED ORDER — EPINEPHRINE 0.3 MG/0.3ML IJ SOAJ: 0.3000 mg | Freq: Once | INTRAMUSCULAR | Status: DC

## 2014-08-21 NOTE — Progress Notes (Signed)
Subjective:    Patient ID: Cameron Moh., male    DOB: [birth year], 81 y.o.   MRN: [id number]  HPI Pt here for follow up and management of chronic medical problems which includes hyperlipidemia. He is only taking OTC medications and is taking them regularly.      Patient Active Problem List   Diagnosis Date Noted  . Arthralgia of hands, bilateral 02/13/2014  . Benign prostatic hypertrophy 05/23/2013  . Vitamin D deficiency 04/11/2013  . Postoperative anemia due to acute blood loss 05/18/2012  . Gilberts syndrome   . Inguinal hernia recurrent unilateral   . Elevated PSA   . Erectile dysfunction   . Hyperlipidemia   . Osteoarthrosis and allied disorders    Outpatient Encounter Prescriptions as of 08/21/2014  Medication Sig  . aspirin 81 MG tablet Take 81 mg by mouth daily.  . cholecalciferol (VITAMIN D) 1000 UNITS tablet Take 1,000 Units by mouth daily. Vit d 3  . Coenzyme Q10 200 MG capsule Take 200 mg by mouth every other day.  Marland Kitchen EPINEPHrine (EPIPEN) 0.3 mg/0.3 mL SOAJ injection Inject 0.3 mLs (0.3 mg total) into the muscle once.  . Flaxseed, Linseed, (FLAX SEED OIL) 1000 MG CAPS Take 1 capsule by mouth daily.  Marland Kitchen glucosamine-chondroitin 500-400 MG tablet Take 1 tablet by mouth 2 (two) times daily.  Marland Kitchen ibuprofen (ADVIL,MOTRIN) 200 MG tablet Take 200 mg by mouth every 6 (six) hours as needed.     Review of Systems  Constitutional: Negative.   HENT: Negative.   Eyes: Negative.   Respiratory: Negative.   Cardiovascular: Negative.   Gastrointestinal: Negative.   Endocrine: Negative.   Genitourinary: Negative.   Musculoskeletal: Negative.   Skin: Negative.        Recent tick bite - left side  Allergic/Immunologic: Negative.   Neurological: Negative.   Hematological: Negative.   Psychiatric/Behavioral: Negative.        Objective:   Physical Exam  Constitutional: He is oriented to person, place, and time. He appears well-developed and well-nourished. No distress.    Alert and pleasant  HENT:  Head: Normocephalic and atraumatic.  Right Ear: External ear normal.  Left Ear: External ear normal.  Nose: Nose normal.  Mouth/Throat: Oropharynx is clear and moist. No oropharyngeal exudate.  Eyes: Conjunctivae and EOM are normal. Pupils are equal, round, and reactive to light. Right eye exhibits no discharge. Left eye exhibits no discharge. No scleral icterus.  The patient has a left sub conjunctival hemorrhage  Neck: Normal range of motion. Neck supple. No thyromegaly present.  Cardiovascular: Normal rate, regular rhythm, normal heart sounds and intact distal pulses.   No murmur heard. At 72/m  Pulmonary/Chest: Effort normal and breath sounds normal. No respiratory distress. He has no wheezes. He has no rales. He exhibits no tenderness.  Clear anteriorly and posteriorly and no axillary adenopathy  Abdominal: Soft. Bowel sounds are normal. He exhibits no mass. There is no tenderness. There is no rebound and no guarding.  No abdominal bruits masses tenderness or organ enlargement  Musculoskeletal: Normal range of motion. He exhibits no edema or tenderness.  Lymphadenopathy:    He has no cervical adenopathy.  Neurological: He is alert and oriented to person, place, and time. He has normal reflexes. No cranial nerve deficit.  Skin: Skin is warm and dry. Rash noted. There is erythema. No pallor.  The patient has a small erythematous rash at the left side of his abdomen and waistline where a tick was removed  from.  Psychiatric: He has a normal mood and affect. His behavior is normal. Judgment and thought content normal.  Nursing note and vitals reviewed.  BP 124/67 mmHg  Pulse 59  Temp(Src) 97.6 F (36.4 C) (Oral)  Ht 5\' 10"  (1.778 m)  Wt 165 lb (74.844 kg)  BMI 23.68 kg/m2  [facility] reading (PRIMARY) by  Dr. Brunilda Payor x-ray--   no active disease                                     Assessment & Plan:  1. Benign prostatic hypertrophy -The patient is  followed regularly for this by his urologist  2. Gilberts syndrome -This elevated bilirubin has been present for many years and the patient is aware of this and it has not causing any problems. - DG Chest 2 View; Future  3. Elevated PSA -He is followed regularly by the urologist  4. Hyperlipidemia -Cholesterol numbers were slightly increased at this time and the patient says he has not been sticking to his diet as closely as possible and not getting as much exercise as he should and that he will try to do better with this. - DG Chest 2 View; Future  5. Vitamin D deficiency -He will continue with his current vitamin D dosage.  6. Tick bite -Take antibiotic as directed until completed - [place] spotted fvr abs pnl(IgG+IgM) - Lyme Ab/Western Blot Reflex - doxycycline (VIBRA-TABS) 100 MG tablet; Take 1 tablet (100 mg total) by mouth 2 (two) times daily.  Dispense: 28 tablet; Refill: 1  7. Bee sting allergy - EPINEPHrine 0.3 mg/0.3 mL IJ SOAJ injection; Inject 0.3 mLs (0.3 mg total) into the muscle once.  Dispense: 1 Device; Refill: 3  8. Conjunctival hemorrhage, left -Reassurance  Patient Instructions                       Medicare Annual Wellness Visit  [facility] and the medical providers at [facility] strive to bring you the best medical care.  In doing so we not only want to address your current medical conditions and concerns but also to detect new conditions early and prevent illness, disease and health-related problems.    Medicare offers a yearly Wellness Visit which allows our clinical staff to assess your need for preventative services including immunizations, lifestyle education, counseling to decrease risk of preventable diseases and screening for fall risk and other medical concerns.    This visit is provided free of charge (no copay) for all Medicare recipients. The clinical pharmacists at [facility] have begun to conduct  these Wellness Visits which will also include a thorough review of all your medications.    As you primary medical provider recommend that you make an appointment for your Annual Wellness Visit if you have not done so already this year.  You may set up this appointment before you leave today or you may call back ([phone]) and schedule an appointment.  Please make sure when you call that you mention that you are scheduling your Annual Wellness Visit with the clinical pharmacist so that the appointment may be made for the proper length of time.     Continue current medications. Continue good therapeutic lifestyle changes which include good diet and exercise. Fall precautions discussed with patient. If an FOBT was given today- please return it to our front desk. If you  are over 71 years old - you may need Prevnar 28 or the adult Pneumonia vaccine.  Flu Shots are still available at our office. If you still haven't had one please call to set up a nurse visit to get one.   After your visit with [place] today you will receive a survey in the mail or online from [employer] regarding your care with [place]. Please take a moment to fill this out. Your feedback is very important to [place] as you can help [place] better understand your patient needs as well as improve your experience and satisfaction. WE CARE ABOUT YOU!!!   Try to get more exercise and watch the diet more closely. Also make an effort to drink more water. Please follow-up with the gastroenterologist and because she is seen some blood in the stool get a colonoscopy if he is willing to do that. Return the FOBT Always be on the lookout for ticks and tick bite Take the doxycycline until it is completed for 2 weeks       [facility] Spotted Fever [facility] Spotted Fever (RMSF) is the oldest known tick-borne disease of people in the [place]. This disease was named because it was first described among people in the [facility] area who had an  illness characterized by a rash with red-purple-black spots. This disease is caused by a rickettsia (Rickettsia rickettsii), a bacteria carried by the tick. The [facility] wood tick and the American dog tick acquire and transmit the RMSF bacteria (pictures NOT actual size). When a larval, nymphal, or adult tick feeds on an infected rodent or larger animal, the tick can become infected. Infected adult ticks then feed on people who may then get RMSF. The tick transmits the disease to humans during a prolonged period of feeding that lasts many hours, days, or even a couple weeks. The bite is painless and frequently goes unnoticed. An infected male tick may also pass the rickettsial bacteria to her eggs that then may mature to be infected adult ticks. The rickettsia that causes RMSF can also get into a person's body through damaged skin. A tick bite is not necessary. People can get RMSF if they crush a tick and get its blood or body fluids on their skin through a small cut or sore.  DIAGNOSIS Diagnosis is made by laboratory tests.  TREATMENT Treatment is with antibiotics (medications that kill rickettsia and other bacteria). Immediate treatment usually prevents death. GEOGRAPHIC RANGE This disease was reported only in the [facility] until 1931. RMSF has more recently been described among individuals in all states except [place], [place], and [place]. The highest reported incidences of RMSF now occur among residents of [place], [place], [place], and the [place]. TIME OF YEAR  Most cases are diagnosed during late spring and summer when ticks are most active. However, especially in the warmer [place] states, a few cases occur during the winter. SYMPTOMS   Symptoms of RMSF begin from 2 to 14 days after a tick bite. The most common early symptoms are fever, muscle aches, and headache followed by nausea (feeling sick to your stomach) or vomiting.  The RMSF rash is typically delayed until 3 or more  days after symptom onset, and eventually develops in 9 of 10 infected patients by the fifth day of illness. If the disease is not treated it can cause death. If you get a fever, headache, muscle aches, rash, nausea, or vomiting within 2 weeks of a possible tick bite or exposure, you  should see your caregiver immediately. PREVENTION Ticks prefer to hide in shady, moist ground litter. They can often be found above the ground clinging to tall grass, brush, shrubs and low tree branches. They also inhabit lawns and gardens, especially at the edges of woodlands and around old stone walls. Within the areas where ticks generally live, no naturally vegetated area can be considered completely free of infected ticks. The best precaution against RMSF is to avoid contact with soil, leaf litter, and vegetation as much as possible in tick-infested areas. For those who enjoy gardening or walking in their yards, clear brush and mow tall grass around houses and at the edges of gardens. This may help reduce the tick population in the immediate area. Applications of chemical insecticides by a licensed professional in the spring (late May) and fall (September) will also control ticks, especially in heavily infested areas. Treatment will never get rid of all the ticks. Getting rid of small animal populations that host ticks will also decrease the tick population. When working in the garden, [employer], or handling soil and vegetation, wear light-colored protective clothing and gloves. Spot-check often to prevent ticks from reaching the skin. Ticks cannot jump or fly. They will not drop from an above-ground perch onto a passing animal. Once a tick gains access to human skin it climbs upward until it reaches a more protected area. For example, the back of the knee, groin, navel, armpit, ears, or nape of the neck. It then begins the slow process of embedding itself in the skin. Campers, hikers, field workers, and others who spend  time in wooded, brushy, or tall grassy areas can avoid exposure to ticks by using the following precautions:  Wear light-colored clothing with a tight weave to spot ticks more easily and prevent contact with the skin.  Wear long pants tucked into socks, long-sleeved shirts tucked into pants and enclosed shoes or boots along with insect repellent.  Spray clothes with insect repellent containing either DEET or Permethrin. Only DEET can be used on exposed skin. Follow the manufacturer's directions carefully.  Wear a hat and keep long hair pulled back.  Stay on cleared, well-worn trails whenever possible.  Spot-check yourself and others often for the presence of ticks on clothes. If you find one, there are likely to be others. Check thoroughly.  Remove clothes after leaving tick-infested areas. If possible, wash them to eliminate any unseen ticks. Check yourself, your children and any pets from head to toe for the presence of ticks.  Shower and shampoo. You can greatly reduce your chances of contracting RMSF if you remove attached ticks as soon as possible. Regular checks of the body, including all body sites covered by hair (head, armpits, genitals), allow removal of the tick before rickettsial transmission. To remove an attached tick, use a forceps or tweezers to detach the intact tick without leaving mouth parts in the skin. The tick bite wound should be cleansed after tick removal. Remember the most common symptoms of RMSF are fever, muscle aches, headache, and nausea or vomiting with a later onset of rash. If you get these symptoms after a tick bite and while living in an area where RMSF is found, RMSF should be suspected. If the disease is not treated, it can cause death. See your caregiver immediately if you get these symptoms. Do this even if not aware of a tick bite. Document Released: 07/24/2000 Document Revised: 08/26/2013 Document Reviewed: 03/16/2009 [facility] Patient Information 2015  [place], [place]. This information  is not intended to replace advice given to you by your health care provider. Make sure you discuss any questions you have with your health care provider.    Arrie Senate MD

## 2014-08-21 NOTE — Patient Instructions (Addendum)
Medicare Annual Wellness Visit  [facility] and the medical providers at [facility] strive to bring you the best medical care.  In doing so we not only want to address your current medical conditions and concerns but also to detect new conditions early and prevent illness, disease and health-related problems.    Medicare offers a yearly Wellness Visit which allows our clinical staff to assess your need for preventative services including immunizations, lifestyle education, counseling to decrease risk of preventable diseases and screening for fall risk and other medical concerns.    This visit is provided free of charge (no copay) for all Medicare recipients. The clinical pharmacists at [facility] have begun to conduct these Wellness Visits which will also include a thorough review of all your medications.    As you primary medical provider recommend that you make an appointment for your Annual Wellness Visit if you have not done so already this year.  You may set up this appointment before you leave today or you may call back ([phone]) and schedule an appointment.  Please make sure when you call that you mention that you are scheduling your Annual Wellness Visit with the clinical pharmacist so that the appointment may be made for the proper length of time.    You have been scheduled for your annual wellness visit on 09/02/14 at 10 am with Nolberto Hanlon, RN - please call [phone] if you need to reschedule.   Continue current medications. Continue good therapeutic lifestyle changes which include good diet and exercise. Fall precautions discussed with patient. If an FOBT was given today- please return it to our front desk. If you are over 33 years old - you may need Prevnar 74 or the adult Pneumonia vaccine.  Flu Shots are still available at our office. If you still haven't had one please call to set up a nurse visit to get one.    After your visit with [place] today you will receive a survey in the mail or online from [employer] regarding your care with [place]. Please take a moment to fill this out. Your feedback is very important to [place] as you can help [place] better understand your patient needs as well as improve your experience and satisfaction. WE CARE ABOUT YOU!!!   Try to get more exercise and watch the diet more closely. Also make an effort to drink more water. Please follow-up with the gastroenterologist and because she is seen some blood in the stool get a colonoscopy if he is willing to do that. Return the FOBT Always be on the lookout for ticks and tick bite Take the doxycycline until it is completed for 2 weeks       [facility] Spotted Fever [facility] Spotted Fever (RMSF) is the oldest known tick-borne disease of people in the [place]. This disease was named because it was first described among people in the [facility] area who had an illness characterized by a rash with red-purple-black spots. This disease is caused by a rickettsia (Rickettsia rickettsii), a bacteria carried by the tick. The [facility] wood tick and the American dog tick acquire and transmit the RMSF bacteria (pictures NOT actual size). When a larval, nymphal, or adult tick feeds on an infected rodent or larger animal, the tick can become infected. Infected adult ticks then feed on people who may then get RMSF. The tick transmits the disease to humans during a prolonged period of feeding that lasts many hours,  days, or even a couple weeks. The bite is painless and frequently goes unnoticed. An infected male tick may also pass the rickettsial bacteria to her eggs that then may mature to be infected adult ticks. The rickettsia that causes RMSF can also get into a person's body through damaged skin. A tick bite is not necessary. People can get RMSF if they crush a tick and get its blood or body fluids on their skin through a small cut  or sore.  DIAGNOSIS Diagnosis is made by laboratory tests.  TREATMENT Treatment is with antibiotics (medications that kill rickettsia and other bacteria). Immediate treatment usually prevents death. GEOGRAPHIC RANGE This disease was reported only in the [facility] until 1931. RMSF has more recently been described among individuals in all states except [place], [place], and [place]. The highest reported incidences of RMSF now occur among residents of [place], [place], [place], and the [place]. TIME OF YEAR  Most cases are diagnosed during late spring and summer when ticks are most active. However, especially in the warmer [place] states, a few cases occur during the winter. SYMPTOMS   Symptoms of RMSF begin from 2 to 14 days after a tick bite. The most common early symptoms are fever, muscle aches, and headache followed by nausea (feeling sick to your stomach) or vomiting.  The RMSF rash is typically delayed until 3 or more days after symptom onset, and eventually develops in 9 of 10 infected patients by the fifth day of illness. If the disease is not treated it can cause death. If you get a fever, headache, muscle aches, rash, nausea, or vomiting within 2 weeks of a possible tick bite or exposure, you should see your caregiver immediately. PREVENTION Ticks prefer to hide in shady, moist ground litter. They can often be found above the ground clinging to tall grass, brush, shrubs and low tree branches. They also inhabit lawns and gardens, especially at the edges of woodlands and around old stone walls. Within the areas where ticks generally live, no naturally vegetated area can be considered completely free of infected ticks. The best precaution against RMSF is to avoid contact with soil, leaf litter, and vegetation as much as possible in tick-infested areas. For those who enjoy gardening or walking in their yards, clear brush and mow tall grass around houses and at the edges of gardens. This  may help reduce the tick population in the immediate area. Applications of chemical insecticides by a licensed professional in the spring (late May) and fall (September) will also control ticks, especially in heavily infested areas. Treatment will never get rid of all the ticks. Getting rid of small animal populations that host ticks will also decrease the tick population. When working in the garden, [employer], or handling soil and vegetation, wear light-colored protective clothing and gloves. Spot-check often to prevent ticks from reaching the skin. Ticks cannot jump or fly. They will not drop from an above-ground perch onto a passing animal. Once a tick gains access to human skin it climbs upward until it reaches a more protected area. For example, the back of the knee, groin, navel, armpit, ears, or nape of the neck. It then begins the slow process of embedding itself in the skin. Campers, hikers, field workers, and others who spend time in wooded, brushy, or tall grassy areas can avoid exposure to ticks by using the following precautions:  Wear light-colored clothing with a tight weave to spot ticks more easily and prevent contact with the skin.  Wear  long pants tucked into socks, long-sleeved shirts tucked into pants and enclosed shoes or boots along with insect repellent.  Spray clothes with insect repellent containing either DEET or Permethrin. Only DEET can be used on exposed skin. Follow the manufacturer's directions carefully.  Wear a hat and keep long hair pulled back.  Stay on cleared, well-worn trails whenever possible.  Spot-check yourself and others often for the presence of ticks on clothes. If you find one, there are likely to be others. Check thoroughly.  Remove clothes after leaving tick-infested areas. If possible, wash them to eliminate any unseen ticks. Check yourself, your children and any pets from head to toe for the presence of ticks.  Shower and shampoo. You can  greatly reduce your chances of contracting RMSF if you remove attached ticks as soon as possible. Regular checks of the body, including all body sites covered by hair (head, armpits, genitals), allow removal of the tick before rickettsial transmission. To remove an attached tick, use a forceps or tweezers to detach the intact tick without leaving mouth parts in the skin. The tick bite wound should be cleansed after tick removal. Remember the most common symptoms of RMSF are fever, muscle aches, headache, and nausea or vomiting with a later onset of rash. If you get these symptoms after a tick bite and while living in an area where RMSF is found, RMSF should be suspected. If the disease is not treated, it can cause death. See your caregiver immediately if you get these symptoms. Do this even if not aware of a tick bite. Document Released: 07/24/2000 Document Revised: 08/26/2013 Document Reviewed: 03/16/2009 [facility] Patient Information 2015 [place], [place]. This information is not intended to replace advice given to you by your health care provider. Make sure you discuss any questions you have with your health care provider.

## 2014-09-02 ENCOUNTER — Encounter: Payer: Self-pay | Admitting: *Deleted

## 2014-09-02 ENCOUNTER — Ambulatory Visit (INDEPENDENT_AMBULATORY_CARE_PROVIDER_SITE_OTHER): Payer: Medicare Other | Admitting: *Deleted

## 2014-09-02 VITALS — BP 123/75 | HR 65 | Ht 69.0 in | Wt 166.0 lb

## 2014-09-02 DIAGNOSIS — Z0000 Encounter for general adult medical examination without abnormal findings: Secondary | ICD-10-CM

## 2014-09-02 NOTE — Patient Instructions (Signed)
You have been scheduled for a consultation for colonoscopy with Dr. Amedeo Plenty at [place] on November 25, 2014 at 1:15 pm.    Thank you for coming in for your annual wellness visit today!    Keep up the good work with all your healthy habits!  Preventive Care for Adults A healthy lifestyle and preventive care can promote health and wellness. Preventive health guidelines for men include the following key practices:  A routine yearly physical is a good way to check with your health care provider about your health and preventative screening. It is a chance to share any concerns and updates on your health and to receive a thorough exam.  Visit your dentist for a routine exam and preventative care every 6 months. Brush your teeth twice a day and floss once a day. Good oral hygiene prevents tooth decay and gum disease.  The frequency of eye exams is based on your age, health, family medical history, use of contact lenses, and other factors. Follow your health care provider's recommendations for frequency of eye exams.  Eat a healthy diet. Foods such as vegetables, fruits, whole grains, low-fat dairy products, and lean protein foods contain the nutrients you need without too many calories. Decrease your intake of foods high in solid fats, added sugars, and salt. Eat the right amount of calories for you.Get information about a proper diet from your health care provider, if necessary.  Regular physical exercise is one of the most important things you can do for your health. Most adults should get at least 150 minutes of moderate-intensity exercise (any activity that increases your heart rate and causes you to sweat) each week. In addition, most adults need muscle-strengthening exercises on 2 or more days a week.  Maintain a healthy weight. The body mass index (BMI) is a screening tool to identify possible weight problems. It provides an estimate of body fat based on height and weight. Your health care provider  can find your BMI and can help you achieve or maintain a healthy weight.For adults 20 years and older:  A BMI below 18.5 is considered underweight.  A BMI of 18.5 to 24.9 is normal.  A BMI of 25 to 29.9 is considered overweight.  A BMI of 30 and above is considered obese.  Maintain normal blood lipids and cholesterol levels by exercising and minimizing your intake of saturated fat. Eat a balanced diet with plenty of fruit and vegetables. Blood tests for lipids and cholesterol should begin at age 32 and be repeated every 5 years. If your lipid or cholesterol levels are high, you are over 50, or you are at high risk for heart disease, you may need your cholesterol levels checked more frequently.Ongoing high lipid and cholesterol levels should be treated with medicines if diet and exercise are not working.  If you smoke, find out from your health care provider how to quit. If you do not use tobacco, do not start.  Lung cancer screening is recommended for adults aged 18-80 years who are at high risk for developing lung cancer because of a history of smoking. A yearly low-dose CT scan of the lungs is recommended for people who have at least a 30-pack-year history of smoking and are a current smoker or have quit within the past 15 years. A pack year of smoking is smoking an average of 1 pack of cigarettes a day for 1 year (for example: 1 pack a day for 30 years or 2 packs a day for  15 years). Yearly screening should continue until the smoker has stopped smoking for at least 15 years. Yearly screening should be stopped for people who develop a health problem that would prevent them from having lung cancer treatment.  If you choose to drink alcohol, do not have more than 2 drinks per day. One drink is considered to be 12 ounces (355 mL) of beer, 5 ounces (148 mL) of wine, or 1.5 ounces (44 mL) of liquor.  Avoid use of street drugs. Do not share needles with anyone. Ask for help if you need support or  instructions about stopping the use of drugs.  High blood pressure causes heart disease and increases the risk of stroke. Your blood pressure should be checked at least every 1-2 years. Ongoing high blood pressure should be treated with medicines, if weight loss and exercise are not effective.  If you are 63-17 years old, ask your health care provider if you should take aspirin to prevent heart disease.  Diabetes screening involves taking a blood sample to check your fasting blood sugar level. This should be done once every 3 years, after age 63, if you are within normal weight and without risk factors for diabetes. Testing should be considered at a younger age or be carried out more frequently if you are overweight and have at least 1 risk factor for diabetes.  Colorectal cancer can be detected and often prevented. Most routine colorectal cancer screening begins at the age of 47 and continues through age 67. However, your health care provider may recommend screening at an earlier age if you have risk factors for colon cancer. On a yearly basis, your health care provider may provide home test kits to check for hidden blood in the stool. Use of a small camera at the end of a tube to directly examine the colon (sigmoidoscopy or colonoscopy) can detect the earliest forms of colorectal cancer. Talk to your health care provider about this at age 56, when routine screening begins. Direct exam of the colon should be repeated every 5-10 years through age 68, unless early forms of precancerous polyps or small growths are found.  People who are at an increased risk for hepatitis B should be screened for this virus. You are considered at high risk for hepatitis B if:  You were born in a country where hepatitis B occurs often. Talk with your health care provider about which countries are considered high risk.  Your parents were born in a high-risk country and you have not received a shot to protect against  hepatitis B (hepatitis B vaccine).  You have HIV or AIDS.  You use needles to inject street drugs.  You live with, or have sex with, someone who has hepatitis B.  You are a man who has sex with other men (MSM).  You get hemodialysis treatment.  You take certain medicines for conditions such as cancer, organ transplantation, and autoimmune conditions.  Hepatitis C blood testing is recommended for all people born from [birth year] through 1965 and any individual with known risks for hepatitis C.  Practice safe sex. Use condoms and avoid high-risk sexual practices to reduce the spread of sexually transmitted infections (STIs). STIs include gonorrhea, chlamydia, syphilis, trichomonas, herpes, HPV, and human immunodeficiency virus (HIV). Herpes, HIV, and HPV are viral illnesses that have no cure. They can result in disability, cancer, and death.  If you are at risk of being infected with HIV, it is recommended that you take a prescription medicine daily  to prevent HIV infection. This is called preexposure prophylaxis (PrEP). You are considered at risk if:  You are a man who has sex with other men (MSM) and have other risk factors.  You are a heterosexual man, are sexually active, and are at increased risk for HIV infection.  You take drugs by injection.  You are sexually active with a partner who has HIV.  Talk with your health care provider about whether you are at high risk of being infected with HIV. If you choose to begin PrEP, you should first be tested for HIV. You should then be tested every 3 months for as long as you are taking PrEP.  A one-time screening for abdominal aortic aneurysm (AAA) and surgical repair of large AAAs by ultrasound are recommended for men ages 16 to 34 years who are current or former smokers.  Healthy men should no longer receive prostate-specific antigen (PSA) blood tests as part of routine cancer screening. Talk with your health care provider about prostate cancer  screening.  Testicular cancer screening is not recommended for adult males who have no symptoms. Screening includes self-exam, a health care provider exam, and other screening tests. Consult with your health care provider about any symptoms you have or any concerns you have about testicular cancer.  Use sunscreen. Apply sunscreen liberally and repeatedly throughout the day. You should seek shade when your shadow is shorter than you. Protect yourself by wearing long sleeves, pants, a wide-brimmed hat, and sunglasses year round, whenever you are outdoors.  Once a month, do a whole-body skin exam, using a mirror to look at the skin on your back. Tell your health care provider about new moles, moles that have irregular borders, moles that are larger than a pencil eraser, or moles that have changed in shape or color.  Stay current with required vaccines (immunizations).  Influenza vaccine. All adults should be immunized every year.  Tetanus, diphtheria, and acellular pertussis (Td, Tdap) vaccine. An adult who has not previously received Tdap or who does not know his vaccine status should receive 1 dose of Tdap. This initial dose should be followed by tetanus and diphtheria toxoids (Td) booster doses every 10 years. Adults with an unknown or incomplete history of completing a 3-dose immunization series with Td-containing vaccines should begin or complete a primary immunization series including a Tdap dose. Adults should receive a Td booster every 10 years.  Varicella vaccine. An adult without evidence of immunity to varicella should receive 2 doses or a second dose if he has previously received 1 dose.  Human papillomavirus (HPV) vaccine. Males aged 56-21 years who have not received the vaccine previously should receive the 3-dose series. Males aged 22-26 years may be immunized. Immunization is recommended through the age of 35 years for any male who has sex with males and did not get any or all doses  earlier. Immunization is recommended for any person with an immunocompromised condition through the age of 52 years if he did not get any or all doses earlier. During the 3-dose series, the second dose should be obtained 4-8 weeks after the first dose. The third dose should be obtained 24 weeks after the first dose and 16 weeks after the second dose.  Zoster vaccine. One dose is recommended for adults aged 51 years or older unless certain conditions are present.  Measles, mumps, and rubella (MMR) vaccine. Adults born before [birth year] generally are considered immune to measles and mumps. Adults born in [birth year] or later should  have 1 or more doses of MMR vaccine unless there is a contraindication to the vaccine or there is laboratory evidence of immunity to each of the three diseases. A routine second dose of MMR vaccine should be obtained at least 28 days after the first dose for students attending postsecondary schools, health care workers, or international travelers. People who received inactivated measles vaccine or an unknown type of measles vaccine during 1963-1967 should receive 2 doses of MMR vaccine. People who received inactivated mumps vaccine or an unknown type of mumps vaccine before 1979 and are at high risk for mumps infection should consider immunization with 2 doses of MMR vaccine. Unvaccinated health care workers born before [birth year] who lack laboratory evidence of measles, mumps, or rubella immunity or laboratory confirmation of disease should consider measles and mumps immunization with 2 doses of MMR vaccine or rubella immunization with 1 dose of MMR vaccine.  Pneumococcal 13-valent conjugate (PCV13) vaccine. When indicated, a person who is uncertain of his immunization history and has no record of immunization should receive the PCV13 vaccine. An adult aged 58 years or older who has certain medical conditions and has not been previously immunized should receive 1 dose of PCV13 vaccine. This PCV13  should be followed with a dose of pneumococcal polysaccharide (PPSV23) vaccine. The PPSV23 vaccine dose should be obtained at least 8 weeks after the dose of PCV13 vaccine. An adult aged 72 years or older who has certain medical conditions and previously received 1 or more doses of PPSV23 vaccine should receive 1 dose of PCV13. The PCV13 vaccine dose should be obtained 1 or more years after the last PPSV23 vaccine dose.  Pneumococcal polysaccharide (PPSV23) vaccine. When PCV13 is also indicated, PCV13 should be obtained first. All adults aged 54 years and older should be immunized. An adult younger than age 52 years who has certain medical conditions should be immunized. Any person who resides in a nursing home or long-term care facility should be immunized. An adult smoker should be immunized. People with an immunocompromised condition and certain other conditions should receive both PCV13 and PPSV23 vaccines. People with human immunodeficiency virus (HIV) infection should be immunized as soon as possible after diagnosis. Immunization during chemotherapy or radiation therapy should be avoided. Routine use of PPSV23 vaccine is not recommended for American Indians, [place] Natives, or people younger than 65 years unless there are medical conditions that require PPSV23 vaccine. When indicated, people who have unknown immunization and have no record of immunization should receive PPSV23 vaccine. One-time revaccination 5 years after the first dose of PPSV23 is recommended for people aged 19-64 years who have chronic kidney failure, nephrotic syndrome, asplenia, or immunocompromised conditions. People who received 1-2 doses of PPSV23 before age 90 years should receive another dose of PPSV23 vaccine at age 24 years or later if at least 5 years have passed since the previous dose. Doses of PPSV23 are not needed for people immunized with PPSV23 at or after age 66 years.  Meningococcal vaccine. Adults with asplenia or  persistent complement component deficiencies should receive 2 doses of quadrivalent meningococcal conjugate (MenACWY-D) vaccine. The doses should be obtained at least 2 months apart. Microbiologists working with certain meningococcal bacteria, [place] recruits, people at risk during an outbreak, and people who travel to or live in countries with a high rate of meningitis should be immunized. A first-year college student up through age 31 years who is living in a residence hall should receive a dose if he did not receive  a dose on or after his 16th birthday. Adults who have certain high-risk conditions should receive one or more doses of vaccine.  Hepatitis A vaccine. Adults who wish to be protected from this disease, have certain high-risk conditions, work with hepatitis A-infected animals, work in hepatitis A research labs, or travel to or work in countries with a high rate of hepatitis A should be immunized. Adults who were previously unvaccinated and who anticipate close contact with an international adoptee during the first 60 days after arrival in the [place] from a country with a high rate of hepatitis A should be immunized.  Hepatitis B vaccine. Adults should be immunized if they wish to be protected from this disease, have certain high-risk conditions, may be exposed to blood or other infectious body fluids, are household contacts or sex partners of hepatitis B positive people, are clients or workers in certain care facilities, or travel to or work in countries with a high rate of hepatitis B.  Haemophilus influenzae type b (Hib) vaccine. A previously unvaccinated person with asplenia or sickle cell disease or having a scheduled splenectomy should receive 1 dose of Hib vaccine. Regardless of previous immunization, a recipient of a hematopoietic stem cell transplant should receive a 3-dose series 6-12 months after his successful transplant. Hib vaccine is not recommended for adults with HIV  infection. Preventive Service / Frequency Ages 39 to 81  Blood pressure check.** / Every 1 to 2 years.  Lipid and cholesterol check.** / Every 5 years beginning at age 59.  Hepatitis C blood test.** / For any individual with known risks for hepatitis C.  Skin self-exam. / Monthly.  Influenza vaccine. / Every year.  Tetanus, diphtheria, and acellular pertussis (Tdap, Td) vaccine.** / Consult your health care provider. 1 dose of Td every 10 years.  Varicella vaccine.** / Consult your health care provider.  HPV vaccine. / 3 doses over 6 months, if 49 or younger.  Measles, mumps, rubella (MMR) vaccine.** / You need at least 1 dose of MMR if you were born in 1957 or later. You may also need a second dose.  Pneumococcal 13-valent conjugate (PCV13) vaccine.** / Consult your health care provider.  Pneumococcal polysaccharide (PPSV23) vaccine.** / 1 to 2 doses if you smoke cigarettes or if you have certain conditions.  Meningococcal vaccine.** / 1 dose if you are age 29 to 62 years and a Market researcher living in a residence hall, or have one of several medical conditions. You may also need additional booster doses.  Hepatitis A vaccine.** / Consult your health care provider.  Hepatitis B vaccine.** / Consult your health care provider.  Haemophilus influenzae type b (Hib) vaccine.** / Consult your health care provider. Ages 56 to 71  Blood pressure check.** / Every 1 to 2 years.  Lipid and cholesterol check.** / Every 5 years beginning at age 62.  Lung cancer screening. / Every year if you are aged 15-80 years and have a 30-pack-year history of smoking and currently smoke or have quit within the past 15 years. Yearly screening is stopped once you have quit smoking for at least 15 years or develop a health problem that would prevent you from having lung cancer treatment.  Fecal occult blood test (FOBT) of stool. / Every year beginning at age 73 and continuing until age 21.  You may not have to do this test if you get a colonoscopy every 10 years.  Flexible sigmoidoscopy** or colonoscopy.** / Every 5 years for a  flexible sigmoidoscopy or every 10 years for a colonoscopy beginning at age 16 and continuing until age 44.  Hepatitis C blood test.** / For all people born from [birth year] through 1965 and any individual with known risks for hepatitis C.  Skin self-exam. / Monthly.  Influenza vaccine. / Every year.  Tetanus, diphtheria, and acellular pertussis (Tdap/Td) vaccine.** / Consult your health care provider. 1 dose of Td every 10 years.  Varicella vaccine.** / Consult your health care provider.  Zoster vaccine.** / 1 dose for adults aged 40 years or older.  Measles, mumps, rubella (MMR) vaccine.** / You need at least 1 dose of MMR if you were born in 1955 or later. You may also need a second dose.  Pneumococcal 13-valent conjugate (PCV13) vaccine.** / Consult your health care provider.  Pneumococcal polysaccharide (PPSV23) vaccine.** / 1 to 2 doses if you smoke cigarettes or if you have certain conditions.  Meningococcal vaccine.** / Consult your health care provider.  Hepatitis A vaccine.** / Consult your health care provider.  Hepatitis B vaccine.** / Consult your health care provider.  Haemophilus influenzae type b (Hib) vaccine.** / Consult your health care provider. Ages 19 and over  Blood pressure check.** / Every 1 to 2 years.  Lipid and cholesterol check.**/ Every 5 years beginning at age 7.  Lung cancer screening. / Every year if you are aged 37-80 years and have a 30-pack-year history of smoking and currently smoke or have quit within the past 15 years. Yearly screening is stopped once you have quit smoking for at least 15 years or develop a health problem that would prevent you from having lung cancer treatment.  Fecal occult blood test (FOBT) of stool. / Every year beginning at age 107 and continuing until age 27. You may not have to do this  test if you get a colonoscopy every 10 years.  Flexible sigmoidoscopy** or colonoscopy.** / Every 5 years for a flexible sigmoidoscopy or every 10 years for a colonoscopy beginning at age 66 and continuing until age 69.  Hepatitis C blood test.** / For all people born from [birth year] through 1963 and any individual with known risks for hepatitis C.  Abdominal aortic aneurysm (AAA) screening.** / A one-time screening for ages 7 to 58 years who are current or former smokers.  Skin self-exam. / Monthly.  Influenza vaccine. / Every year.  Tetanus, diphtheria, and acellular pertussis (Tdap/Td) vaccine.** / 1 dose of Td every 10 years.  Varicella vaccine.** / Consult your health care provider.  Zoster vaccine.** / 1 dose for adults aged 53 years or older.  Pneumococcal 13-valent conjugate (PCV13) vaccine.** / Consult your health care provider.  Pneumococcal polysaccharide (PPSV23) vaccine.** / 1 dose for all adults aged 60 years and older.  Meningococcal vaccine.** / Consult your health care provider.  Hepatitis A vaccine.** / Consult your health care provider.  Hepatitis B vaccine.** / Consult your health care provider.  Haemophilus influenzae type b (Hib) vaccine.** / Consult your health care provider. **Family history and personal history of risk and conditions may change your health care provider's recommendations. Document Released: 06/07/2001 Document Revised: 04/16/2013 Document Reviewed: 09/06/2010 [facility] Patient Information 2015 [place], [place]. This information is not intended to replace advice given to you by your health care provider. Make sure you discuss any questions you have with your health care provider.

## 2014-09-02 NOTE — Progress Notes (Signed)
Subjective:   Cameron [name]. is a 81 y.o. male who presents for an Initial Medicare Annual Wellness Visit.  Patient is doing well today with no complaints.  He is retired from [employer], and lives with his wife in a retirement community in [place], [place].  They have 4 children.  He stays active playing golf and walking for exercise.  He is due for his colonoscopy, and we will get this scheduled today.        Objective:    Today's Vitals   09/02/14 1028  BP: 123/75  Pulse: 65  Height: 5\' 9"  (1.753 m)  Weight: 166 lb (75.297 kg)  PainSc: 0-No pain    Current Medications (verified) Outpatient Encounter Prescriptions as of 09/02/2014  Medication Sig  . aspirin 81 MG tablet Take 81 mg by mouth daily.  . cholecalciferol (VITAMIN D) 1000 UNITS tablet Take 1,000 Units by mouth daily. Vit d 3- takes  . Coenzyme Q10 200 MG capsule Take 200 mg by mouth every other day.  Marland Kitchen doxycycline (VIBRA-TABS) 100 MG tablet Take 1 tablet (100 mg total) by mouth 2 (two) times daily.  Marland Kitchen EPINEPHrine 0.3 mg/0.3 mL IJ SOAJ injection Inject 0.3 mLs (0.3 mg total) into the muscle once.  . Flaxseed, Linseed, (FLAX SEED OIL) 1000 MG CAPS Take 1 capsule by mouth daily.  Marland Kitchen glucosamine-chondroitin 500-400 MG tablet Take 1 tablet by mouth daily.   Marland Kitchen ibuprofen (ADVIL,MOTRIN) 200 MG tablet Take 200 mg by mouth every 6 (six) hours as needed.   No facility-administered encounter medications on file as of 09/02/2014.    Allergies (verified) Bee venom   History: Past Medical History  Diagnosis Date  . BPH (benign prostatic hyperplasia)   . Osteoarthrosis and allied disorders   . Erectile dysfunction   . Other and unspecified hyperlipidemia   . Elevated PSA   . Inguinal hernia recurrent unilateral   . Gilberts syndrome     elevated bile on liver tests in past  . Arthritis   . Cold early jan 2015    no cough or cold now  . Nocturia   . Rotator cuff tear, left     partial  . Hyperlipidemia   . Wears  glasses    Past Surgical History  Procedure Laterality Date  . Prostate surgery  2004    tuna done  . Tonsillectomy      as child  . Right hand  1987    tendon repair   . Knee arthroscopy      bilateral  . Total knee arthroplasty  05/16/2012    Procedure: TOTAL KNEE ARTHROPLASTY;  Surgeon: Tobi Bastos, MD;  Location: [facility];  Service: Orthopedics;  Laterality: Left;  . Transurethral resection of prostate N/A 05/23/2013    Procedure: TRANSURETHRAL RESECTION OF THE PROSTATE WITH GYRUS INSTRUMENTS;  Surgeon: Ailene Rud, MD;  Location: [facility];  Service: Urology;  Laterality: N/A;  . Carpal tunnel release Left 06/27/2013    Procedure: LEFT CARPAL TUNNEL RELEASE;  Surgeon: Cammie Sickle., MD;  Location: [facility];  Service: Orthopedics;  Laterality: Left;  . Joint replacement Right     knee   Family History  Problem Relation Age of Onset  . COPD Mother   . Heart disease Father   . Heart attack Father   . Cancer Maternal Grandfather   . Heart attack Paternal Grandfather   . Hypertension Sister   . Hypertension Sister  Activities of Daily Living In your present state of health, do you have any difficulty performing the following activities: 09/02/2014  Hearing? N  Vision? N  Difficulty concentrating or making decisions? N  Walking or climbing stairs? N  Dressing or bathing? N  Doing errands, shopping? N  Preparing Food and eating ? N  Using the Toilet? N  In the past six months, have you accidently leaked urine? N  Do you have problems with loss of bowel control? N  Managing your Medications? N  Managing your Finances? N  Housekeeping or managing your Housekeeping? N    Immunizations and Health Maintenance Immunization History  Administered Date(s) Administered  . Influenza Split 01/31/2013  . Influenza Whole 12/24/2009  . Influenza,inj,Quad PF,36+ Mos 02/13/2014  . PPD Test 01/14/2014  . Pneumococcal Conjugate-13 04/11/2013  .  Pneumococcal Polysaccharide-23 04/25/2004  . Tdap 12/24/2009  . Zoster 01/24/2007   There are no preventive care reminders to display for this patient.  Patient Care Team: [name], MD as PCP - General (Family Medicine) Carolan Clines, MD as Consulting Physician (Urology) Alphonsa Overall, MD as Consulting Physician (General Surgery)      Assessment:   This is a routine wellness examination for Cameron [name].   Hearing/Vision screen Sees Dr. Katy Fitch ophthalmologist in [place], [place].  Has an appointment with him next week.  States he sees well with his glasses, but they are scratched, and he plans to get a new pair at his appointment next week.  No history of glaucoma or cataracts. No hearing deficits noted during visit.   Dietary issues and exercise activities discussed: Current Exercise Habits:: Home exercise routine, Time (Minutes): 60, Frequency (Times/Week): 6, Weekly Exercise (Minutes/Week): 360, Intensity: Moderate   Walks 3-4 miles daily, does resistance training on machines at the gym 3 times per week, and plays golf 3 times per week Eats a healthy diet- mostly fruits, vegetables, and lean meats- avoids gluten, drinks only water and coffee with meals    Goals    None      Complete colonoscopy this summer  Depression Screen PHQ 2/9 Scores 09/02/2014 08/21/2014 08/08/2013  PHQ - 2 Score 0 0 0    Fall Risk Fall Risk  09/02/2014 08/21/2014 08/08/2013 10/04/2012  Falls in the past year? No No No No  Risk for fall due to : - - Impaired vision -    Cognitive Function: MMSE - Mini Mental State Exam 09/02/2014  Orientation to time 5  Orientation to Place 5  Registration 3  Attention/ Calculation 5  Recall 3  Language- name 2 objects 2  Language- repeat 0  Language- follow 3 step command 3  Language- read & follow direction 1  Write a sentence 1  Copy design 1  Total score 29    Screening Tests Health Maintenance  Topic Date Due  . COLONOSCOPY  08/20/2016 (Originally  06/24/2014)  . INFLUENZA VACCINE  11/24/2014  . TETANUS/TDAP  12/25/2019  . ZOSTAVAX  Completed  . PNA vac Low Risk Adult  Completed   Patients last colonoscopy was 06/2004- he received notice from [facility] that it is time for repeat routine screening colonoscopy.  Scheduled patient for consultation appointment with Dr. Amedeo Plenty on 11/25/14 at 1:15 pm.      Plan:     Continue healthy diet and exercise habits. Go for your consultation appointment with Dr. Amedeo Plenty at [facility] 11/25/14  During the course of the visit Json was educated and counseled about the following appropriate  screening and preventive services:   Vaccines to include Pneumoccal, Influenza, Td, Zostavax- up to date  Electrocardiogram- recommended for next visit  Colorectal cancer screening- scheduled consultation on 11/25/14  Cardiovascular disease screening- lipids screened 07/2014  Diabetes screening - done 07/2014  Glaucoma screening scheduled for next week  Nutrition counseling- discussed today  Prostate cancer screening- up to date  Patient Instructions (the written plan) were given to the patient.   Keelin Sheridan M, RN   09/02/2014       I have reviewed and agree with the above AWV documentation.  Claretta Fraise, M.D.

## 2014-09-09 DIAGNOSIS — H25813 Combined forms of age-related cataract, bilateral: Secondary | ICD-10-CM | POA: Diagnosis not present

## 2014-09-09 DIAGNOSIS — H02831 Dermatochalasis of right upper eyelid: Secondary | ICD-10-CM | POA: Diagnosis not present

## 2014-09-09 DIAGNOSIS — H02834 Dermatochalasis of left upper eyelid: Secondary | ICD-10-CM | POA: Diagnosis not present

## 2014-09-09 DIAGNOSIS — H5703 Miosis: Secondary | ICD-10-CM | POA: Diagnosis not present

## 2014-09-09 DIAGNOSIS — H43813 Vitreous degeneration, bilateral: Secondary | ICD-10-CM | POA: Diagnosis not present

## 2014-10-20 DIAGNOSIS — R972 Elevated prostate specific antigen [PSA]: Secondary | ICD-10-CM | POA: Diagnosis not present

## 2014-10-20 DIAGNOSIS — R351 Nocturia: Secondary | ICD-10-CM | POA: Diagnosis not present

## 2014-10-20 DIAGNOSIS — N401 Enlarged prostate with lower urinary tract symptoms: Secondary | ICD-10-CM | POA: Diagnosis not present

## 2014-11-13 ENCOUNTER — Encounter: Payer: Self-pay | Admitting: *Deleted

## 2014-11-25 DIAGNOSIS — Z1211 Encounter for screening for malignant neoplasm of colon: Secondary | ICD-10-CM | POA: Diagnosis not present

## 2014-12-31 DIAGNOSIS — K641 Second degree hemorrhoids: Secondary | ICD-10-CM | POA: Diagnosis not present

## 2014-12-31 DIAGNOSIS — Z1211 Encounter for screening for malignant neoplasm of colon: Secondary | ICD-10-CM | POA: Diagnosis not present

## 2015-01-21 ENCOUNTER — Ambulatory Visit (INDEPENDENT_AMBULATORY_CARE_PROVIDER_SITE_OTHER): Payer: Medicare Other

## 2015-01-21 DIAGNOSIS — Z23 Encounter for immunization: Secondary | ICD-10-CM

## 2015-02-10 ENCOUNTER — Other Ambulatory Visit (INDEPENDENT_AMBULATORY_CARE_PROVIDER_SITE_OTHER): Payer: Medicare Other

## 2015-02-10 DIAGNOSIS — E559 Vitamin D deficiency, unspecified: Secondary | ICD-10-CM

## 2015-02-10 DIAGNOSIS — R972 Elevated prostate specific antigen [PSA]: Secondary | ICD-10-CM | POA: Diagnosis not present

## 2015-02-10 DIAGNOSIS — E785 Hyperlipidemia, unspecified: Secondary | ICD-10-CM

## 2015-02-10 NOTE — Progress Notes (Signed)
Lab only 

## 2015-02-11 LAB — CBC WITH DIFFERENTIAL/PLATELET
BASOS ABS: 0 10*3/uL (ref 0.0–0.2)
BASOS: 0 %
EOS (ABSOLUTE): 0.1 10*3/uL (ref 0.0–0.4)
EOS: 2 %
HEMATOCRIT: 49.9 % (ref 37.5–51.0)
HEMOGLOBIN: 16.8 g/dL (ref 12.6–17.7)
Immature Grans (Abs): 0 10*3/uL (ref 0.0–0.1)
Immature Granulocytes: 0 %
LYMPHS ABS: 1.8 10*3/uL (ref 0.7–3.1)
Lymphs: 25 %
MCH: 30.8 pg (ref 26.6–33.0)
MCHC: 33.7 g/dL (ref 31.5–35.7)
MCV: 92 fL (ref 79–97)
MONOCYTES: 7 %
Monocytes Absolute: 0.5 10*3/uL (ref 0.1–0.9)
NEUTROS ABS: 4.7 10*3/uL (ref 1.4–7.0)
Neutrophils: 66 %
Platelets: 236 10*3/uL (ref 150–379)
RBC: 5.45 x10E6/uL (ref 4.14–5.80)
RDW: 13.1 % (ref 12.3–15.4)
WBC: 7.2 10*3/uL (ref 3.4–10.8)

## 2015-02-11 LAB — HEPATIC FUNCTION PANEL
ALK PHOS: 79 IU/L (ref 39–117)
ALT: 14 IU/L (ref 0–44)
AST: 13 IU/L (ref 0–40)
Albumin: 4.5 g/dL (ref 3.5–4.7)
BILIRUBIN, DIRECT: 0.34 mg/dL (ref 0.00–0.40)
Bilirubin Total: 1.7 mg/dL — ABNORMAL HIGH (ref 0.0–1.2)
Total Protein: 6.6 g/dL (ref 6.0–8.5)

## 2015-02-11 LAB — BMP8+EGFR
BUN / CREAT RATIO: 26 — AB (ref 10–22)
BUN: 21 mg/dL (ref 8–27)
CHLORIDE: 100 mmol/L (ref 97–106)
CO2: 27 mmol/L (ref 18–29)
Calcium: 9.6 mg/dL (ref 8.6–10.2)
Creatinine, Ser: 0.82 mg/dL (ref 0.76–1.27)
GFR calc Af Amer: 97 mL/min/{1.73_m2} (ref >59)
GFR calc non Af Amer: 84 mL/min/{1.73_m2} (ref >59)
Glucose: 102 mg/dL — ABNORMAL HIGH (ref 65–99)
Potassium: 4.2 mmol/L (ref 3.5–5.2)
Sodium: 141 mmol/L (ref 136–144)

## 2015-02-11 LAB — NMR, LIPOPROFILE
CHOLESTEROL: 155 mg/dL (ref 100–199)
HDL Cholesterol by NMR: 44 mg/dL (ref >39)
HDL Particle Number: 35.9 umol/L (ref >=30.5)
LDL PARTICLE NUMBER: 1033 nmol/L — AB (ref <1000)
LDL Size: 20.3 nm (ref >20.5)
LDL-C: 71 mg/dL (ref 0–99)
LP-IR SCORE: 75 — AB (ref <=45)
Small LDL Particle Number: 535 nmol/L — ABNORMAL HIGH (ref <=527)
TRIGLYCERIDES BY NMR: 198 mg/dL — AB (ref 0–149)

## 2015-02-11 LAB — VITAMIN D 25 HYDROXY (VIT D DEFICIENCY, FRACTURES): Vit D, 25-Hydroxy: 52.4 ng/mL (ref 30.0–100.0)

## 2015-02-25 ENCOUNTER — Other Ambulatory Visit: Payer: Self-pay

## 2015-02-25 ENCOUNTER — Ambulatory Visit (INDEPENDENT_AMBULATORY_CARE_PROVIDER_SITE_OTHER): Payer: Medicare Other | Admitting: Family Medicine

## 2015-02-25 ENCOUNTER — Encounter: Payer: Self-pay | Admitting: Family Medicine

## 2015-02-25 VITALS — BP 109/59 | HR 53 | Temp 97.2°F | Ht 69.0 in | Wt 168.0 lb

## 2015-02-25 DIAGNOSIS — N400 Enlarged prostate without lower urinary tract symptoms: Secondary | ICD-10-CM

## 2015-02-25 DIAGNOSIS — E785 Hyperlipidemia, unspecified: Secondary | ICD-10-CM

## 2015-02-25 DIAGNOSIS — M79661 Pain in right lower leg: Secondary | ICD-10-CM | POA: Diagnosis not present

## 2015-02-25 DIAGNOSIS — E559 Vitamin D deficiency, unspecified: Secondary | ICD-10-CM

## 2015-02-25 DIAGNOSIS — M79604 Pain in right leg: Secondary | ICD-10-CM

## 2015-02-25 NOTE — Progress Notes (Signed)
Subjective:    Patient ID: Cameron Moh., male    DOB: 08-23-1932, 81 y.o.   MRN: [id number]  HPI Pt here for follow up and management of chronic medical problems which includes hyperlipidemia. He is taking medications regularly. The patient is doing well. He has moved to [place] to a retirement center. He still returns here for his medical care. He has had his lab work done and this will be reviewed with him during the visit today. He is also given an FOBT to return. He is complaining with some right calf pain at times and has concerns about his right ear. The patient denies chest pain shortness of breath trouble swallowing heartburn indigestion and nausea vomiting diarrhea or blood in the stool. He is noticed this left calf pain off and on and it will sometimes stay with him for several days after playing golf and walking. Ibuprofen seems to alleviate this and he takes this only as needed.    Patient Active Problem List   Diagnosis Date Noted  . Arthralgia of hands, bilateral 02/13/2014  . Benign prostatic hypertrophy 05/23/2013  . Vitamin D deficiency 04/11/2013  . Postoperative anemia due to acute blood loss 05/18/2012  . Gilberts syndrome   . Inguinal hernia recurrent unilateral   . Elevated PSA   . Erectile dysfunction   . Hyperlipidemia   . Osteoarthrosis and allied disorders    Outpatient Encounter Prescriptions as of 02/25/2015  Medication Sig  . aspirin 81 MG tablet Take 81 mg by mouth daily.  . cholecalciferol (VITAMIN D) 1000 UNITS tablet Take 1,000 Units by mouth daily. Vit d 3- takes  . Coenzyme Q10 200 MG capsule Take 200 mg by mouth every other day.  Marland Kitchen EPINEPHrine 0.3 mg/0.3 mL IJ SOAJ injection Inject 0.3 mLs (0.3 mg total) into the muscle once.  . Flaxseed, Linseed, (FLAX SEED OIL) 1000 MG CAPS Take 1 capsule by mouth daily.  Marland Kitchen glucosamine-chondroitin 500-400 MG tablet Take 1 tablet by mouth daily.   Marland Kitchen ibuprofen (ADVIL,MOTRIN) 200 MG tablet Take 200 mg by mouth  every 6 (six) hours as needed.  . [DISCONTINUED] doxycycline (VIBRA-TABS) 100 MG tablet Take 1 tablet (100 mg total) by mouth 2 (two) times daily.   No facility-administered encounter medications on file as of 02/25/2015.      Review of Systems  Constitutional: Negative.   HENT: Negative.   Eyes: Negative.   Respiratory: Negative.   Cardiovascular: Negative.   Gastrointestinal: Negative.   Endocrine: Negative.   Genitourinary: Negative.   Musculoskeletal: Positive for myalgias (left calf pain).  Skin: Negative.   Allergic/Immunologic: Negative.   Neurological: Negative.   Hematological: Negative.   Psychiatric/Behavioral: Negative.        Objective:   Physical Exam  Constitutional: He is oriented to person, place, and time. He appears well-developed and well-nourished. No distress.  The patient is alert and looks much younger than his stated age. He is in good spirits.  HENT:  Head: Normocephalic and atraumatic.  Left Ear: External ear normal.  Nose: Nose normal.  Mouth/Throat: Oropharynx is clear and moist. No oropharyngeal exudate.  The right ear canal is smaller than the left ear canal and has a minimal amount of wax but a normal eardrum  Eyes: Conjunctivae and EOM are normal. Pupils are equal, round, and reactive to light. Right eye exhibits no discharge. Left eye exhibits no discharge. No scleral icterus.  The patient is up-to-date on his eye exams  Neck: Normal range  of motion. Neck supple. No thyromegaly present.  Cardiovascular: Normal rate, regular rhythm, normal heart sounds and intact distal pulses.   No murmur heard. At 60/m  Pulmonary/Chest: Effort normal and breath sounds normal. No respiratory distress. He has no wheezes. He has no rales. He exhibits no tenderness.  Abdominal: Soft. Bowel sounds are normal. He exhibits no mass. There is no tenderness. There is no rebound and no guarding.  Genitourinary: Prostate normal.  The patient sees the urologist once  yearly and he does his rectal exam. He is up-to-date on this cording to the patient.  Musculoskeletal: Normal range of motion. He exhibits no edema or tenderness.  No right calf tenderness. Left knee replacement.  Lymphadenopathy:    He has no cervical adenopathy.  Neurological: He is alert and oriented to person, place, and time. He has normal reflexes. No cranial nerve deficit.  Skin: Skin is warm and dry. No rash noted.  Psychiatric: He has a normal mood and affect. His behavior is normal. Judgment and thought content normal.  Nursing note and vitals reviewed.  BP 109/59 mmHg  Pulse 53  Temp(Src) 97.2 F (36.2 C) (Oral)  Ht 5\' 9"  (1.753 m)  Wt 168 lb (76.204 kg)  BMI 24.80 kg/m2        Assessment & Plan:  1. Benign prostatic hypertrophy -Continue to follow-up with urology  2. Gilberts syndrome -This unconjugated hyperbilirubinemia has been stable with this patient with no problems or complications and he understands the reason for the elevated bilirubin.  3. Hyperlipidemia -Triglycerides were elevated on this visit but his total LDL PE cholesterol was improved. -The patient will take some JR Carlson fish oil 1 twice daily to see if this might help his triglycerides and try to do better with his diet.  4. Vitamin D deficiency -His vitamin D level was excellent. Continue with current treatment  5. Calf pain, right -We will arrange for Dopplers of his right calf to rule out a DVT and encourage the patient to take Aleve 1 twice daily after breakfast and supper for 3-4 weeks.  Patient Instructions                       Medicare Annual Wellness Visit  [facility] and the medical providers at [facility] strive to bring you the best medical care.  In doing so we not only want to address your current medical conditions and concerns but also to detect new conditions early and prevent illness, disease and health-related problems.    Medicare offers a yearly  Wellness Visit which allows our clinical staff to assess your need for preventative services including immunizations, lifestyle education, counseling to decrease risk of preventable diseases and screening for fall risk and other medical concerns.    This visit is provided free of charge (no copay) for all Medicare recipients. The clinical pharmacists at [facility] have begun to conduct these Wellness Visits which will also include a thorough review of all your medications.    As you primary medical provider recommend that you make an appointment for your Annual Wellness Visit if you have not done so already this year.  You may set up this appointment before you leave today or you may call back [phone]) and schedule an appointment.  Please make sure when you call that you mention that you are scheduling your Annual Wellness Visit with the clinical pharmacist so that the appointment may be made for the  proper length of time.     Continue current medications. Continue good therapeutic lifestyle changes which include good diet and exercise. Fall precautions discussed with patient. If an FOBT was given today- please return it to our front desk. If you are over 63 years old - you may need Prevnar 89 or the adult Pneumonia vaccine.  **Flu shots are available--- please call and schedule a FLU-CLINIC appointment**  After your visit with [place] today you will receive a survey in the mail or online from [employer] regarding your care with [place]. Please take a moment to fill this out. Your feedback is very important to [place] as you can help [place] better understand your patient needs as well as improve your experience and satisfaction. WE CARE ABOUT YOU!!!   We will arrange for the patient to get an ultrasound to rule out a DVT He should take Aleve 1 twice daily after breakfast and supper for the next 3-4 weeks unless it bothers his stomach It is okay to continue to walk on a level  surface.   Arrie Senate MD

## 2015-02-25 NOTE — Patient Instructions (Addendum)
Medicare Annual Wellness Visit  [facility] and the medical providers at [facility] strive to bring you the best medical care.  In doing so we not only want to address your current medical conditions and concerns but also to detect new conditions early and prevent illness, disease and health-related problems.    Medicare offers a yearly Wellness Visit which allows our clinical staff to assess your need for preventative services including immunizations, lifestyle education, counseling to decrease risk of preventable diseases and screening for fall risk and other medical concerns.    This visit is provided free of charge (no copay) for all Medicare recipients. The clinical pharmacists at [facility] have begun to conduct these Wellness Visits which will also include a thorough review of all your medications.    As you primary medical provider recommend that you make an appointment for your Annual Wellness Visit if you have not done so already this year.  You may set up this appointment before you leave today or you may call back [phone]) and schedule an appointment.  Please make sure when you call that you mention that you are scheduling your Annual Wellness Visit with the clinical pharmacist so that the appointment may be made for the proper length of time.     Continue current medications. Continue good therapeutic lifestyle changes which include good diet and exercise. Fall precautions discussed with patient. If an FOBT was given today- please return it to our front desk. If you are over 47 years old - you may need Prevnar 43 or the adult Pneumonia vaccine.  **Flu shots are available--- please call and schedule a FLU-CLINIC appointment**  After your visit with [place] today you will receive a survey in the mail or online from [employer] regarding your care with [place]. Please take a moment to fill this out. Your feedback is very  important to [place] as you can help [place] better understand your patient needs as well as improve your experience and satisfaction. WE CARE ABOUT YOU!!!   We will arrange for the patient to get an ultrasound to rule out a DVT He should take Aleve 1 twice daily after breakfast and supper for the next 3-4 weeks unless it bothers his stomach It is okay to continue to walk on a level surface.

## 2015-02-26 ENCOUNTER — Ambulatory Visit: Payer: Medicare Other | Admitting: Family Medicine

## 2015-02-26 ENCOUNTER — Ambulatory Visit (HOSPITAL_COMMUNITY)
Admission: RE | Admit: 2015-02-26 | Discharge: 2015-02-26 | Disposition: A | Discharge status: Home / Self Care | Payer: Medicare Other | Source: Ambulatory Visit | Attending: Family Medicine | Admitting: Family Medicine

## 2015-02-26 DIAGNOSIS — M79661 Pain in right lower leg: Secondary | ICD-10-CM | POA: Diagnosis not present

## 2015-02-26 DIAGNOSIS — M79604 Pain in right leg: Secondary | ICD-10-CM | POA: Diagnosis not present

## 2015-02-26 NOTE — Progress Notes (Signed)
Preliminary results by tech - Right lower extremity venous duplex completed. Negative for deep and superficial vein thrombosis in the right lower extremity. A Baker's cyst was noted behind the right knee.   Oda Cogan, BS, RDMS, RVT

## 2015-03-02 ENCOUNTER — Telehealth: Payer: Self-pay | Admitting: Family Medicine

## 2015-03-02 NOTE — Telephone Encounter (Signed)
Patient called stating that he had a colonoscopy 12/31/14 and wanted to know if he still needed to do stool card.  Informed patient that usually we have patient do stool card yearly.

## 2015-03-12 DIAGNOSIS — H25813 Combined forms of age-related cataract, bilateral: Secondary | ICD-10-CM | POA: Diagnosis not present

## 2015-03-12 DIAGNOSIS — H5703 Miosis: Secondary | ICD-10-CM | POA: Diagnosis not present

## 2015-04-03 DIAGNOSIS — M1711 Unilateral primary osteoarthritis, right knee: Secondary | ICD-10-CM | POA: Diagnosis not present

## 2015-04-07 DIAGNOSIS — C44519 Basal cell carcinoma of skin of other part of trunk: Secondary | ICD-10-CM | POA: Diagnosis not present

## 2015-04-07 DIAGNOSIS — D0422 Carcinoma in situ of skin of left ear and external auricular canal: Secondary | ICD-10-CM | POA: Diagnosis not present

## 2015-04-07 DIAGNOSIS — Z85828 Personal history of other malignant neoplasm of skin: Secondary | ICD-10-CM | POA: Diagnosis not present

## 2015-04-07 DIAGNOSIS — L821 Other seborrheic keratosis: Secondary | ICD-10-CM | POA: Diagnosis not present

## 2015-04-07 DIAGNOSIS — D225 Melanocytic nevi of trunk: Secondary | ICD-10-CM | POA: Diagnosis not present

## 2015-04-07 DIAGNOSIS — L570 Actinic keratosis: Secondary | ICD-10-CM | POA: Diagnosis not present

## 2015-04-13 DIAGNOSIS — D0422 Carcinoma in situ of skin of left ear and external auricular canal: Secondary | ICD-10-CM | POA: Diagnosis not present

## 2015-04-13 DIAGNOSIS — Z85828 Personal history of other malignant neoplasm of skin: Secondary | ICD-10-CM | POA: Diagnosis not present

## 2015-05-11 DIAGNOSIS — H2512 Age-related nuclear cataract, left eye: Secondary | ICD-10-CM | POA: Diagnosis not present

## 2015-05-11 DIAGNOSIS — H25812 Combined forms of age-related cataract, left eye: Secondary | ICD-10-CM | POA: Diagnosis not present

## 2015-05-12 IMAGING — CR DG HUMERUS 2V *L*
2 series · 2 of 2 positions shown · non-contrast
Comparison: None.

CLINICAL DATA: Left arm pain

EXAM:
LEFT HUMERUS - 2+ VIEW

[view not recorded (1 of 2)]
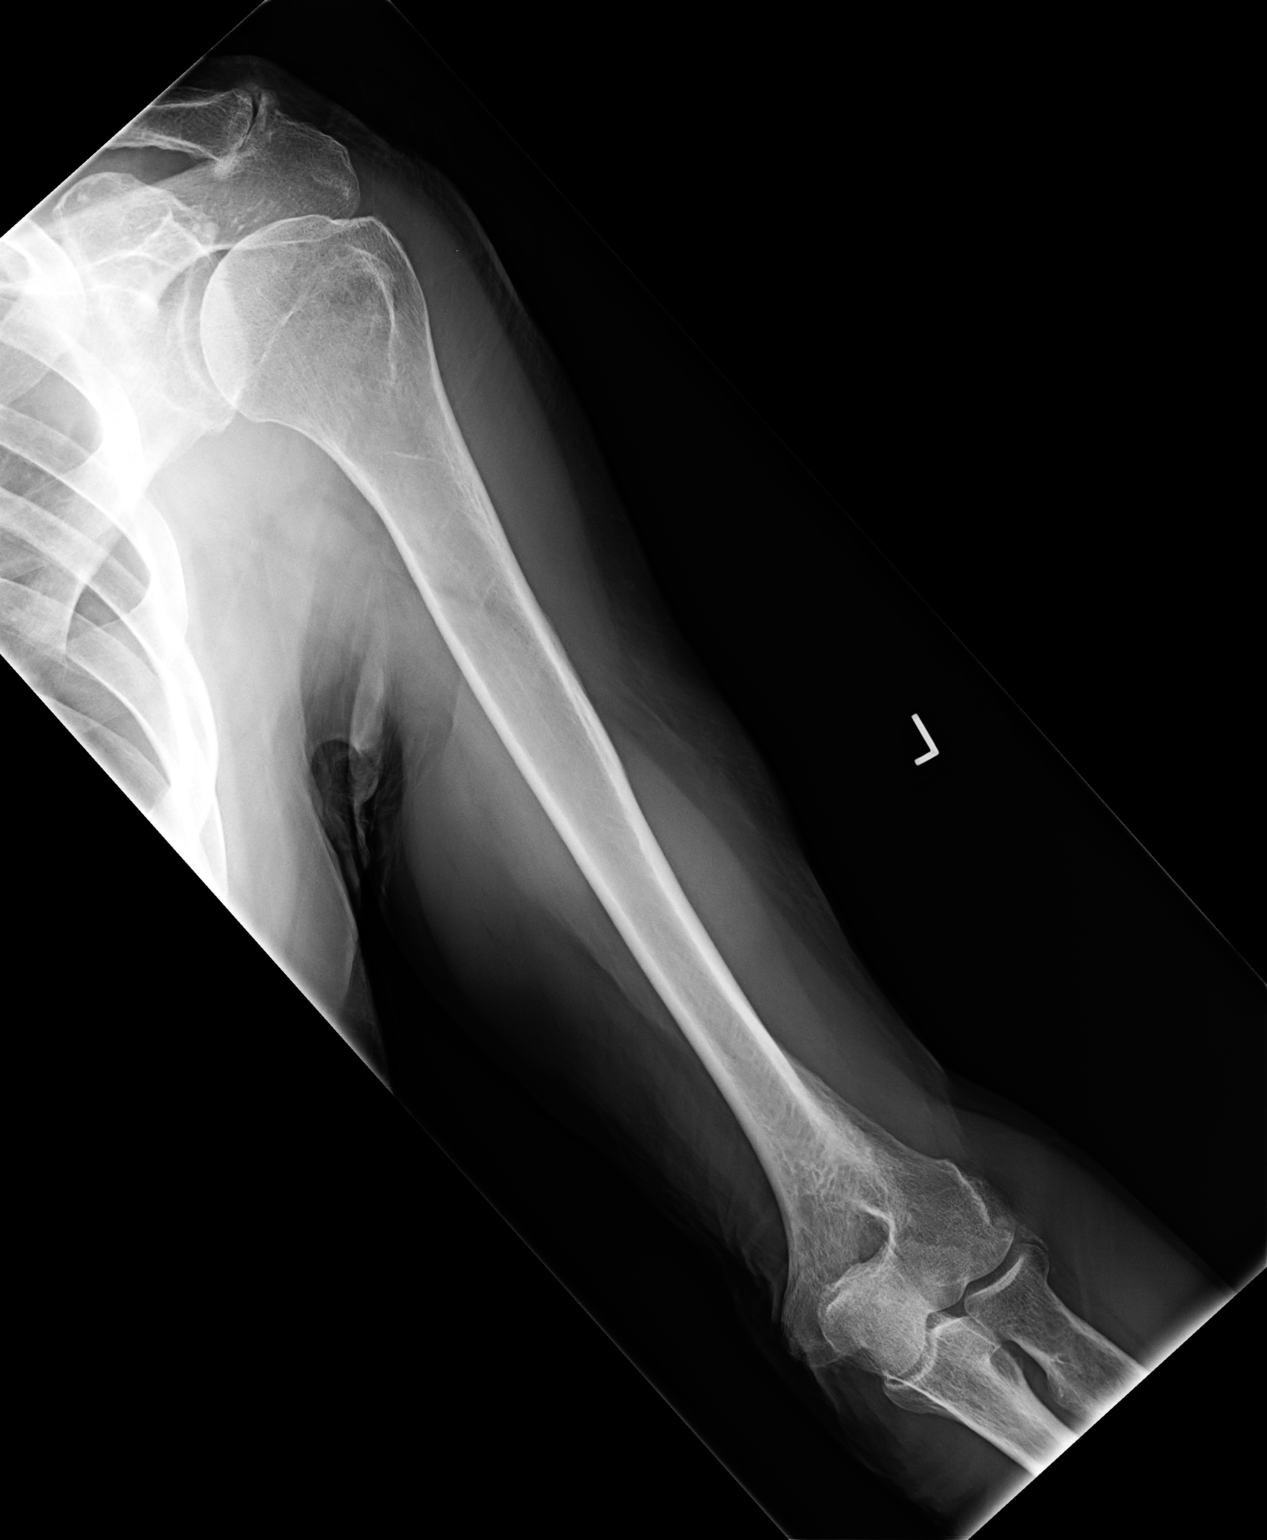

[view not recorded (2 of 2)]
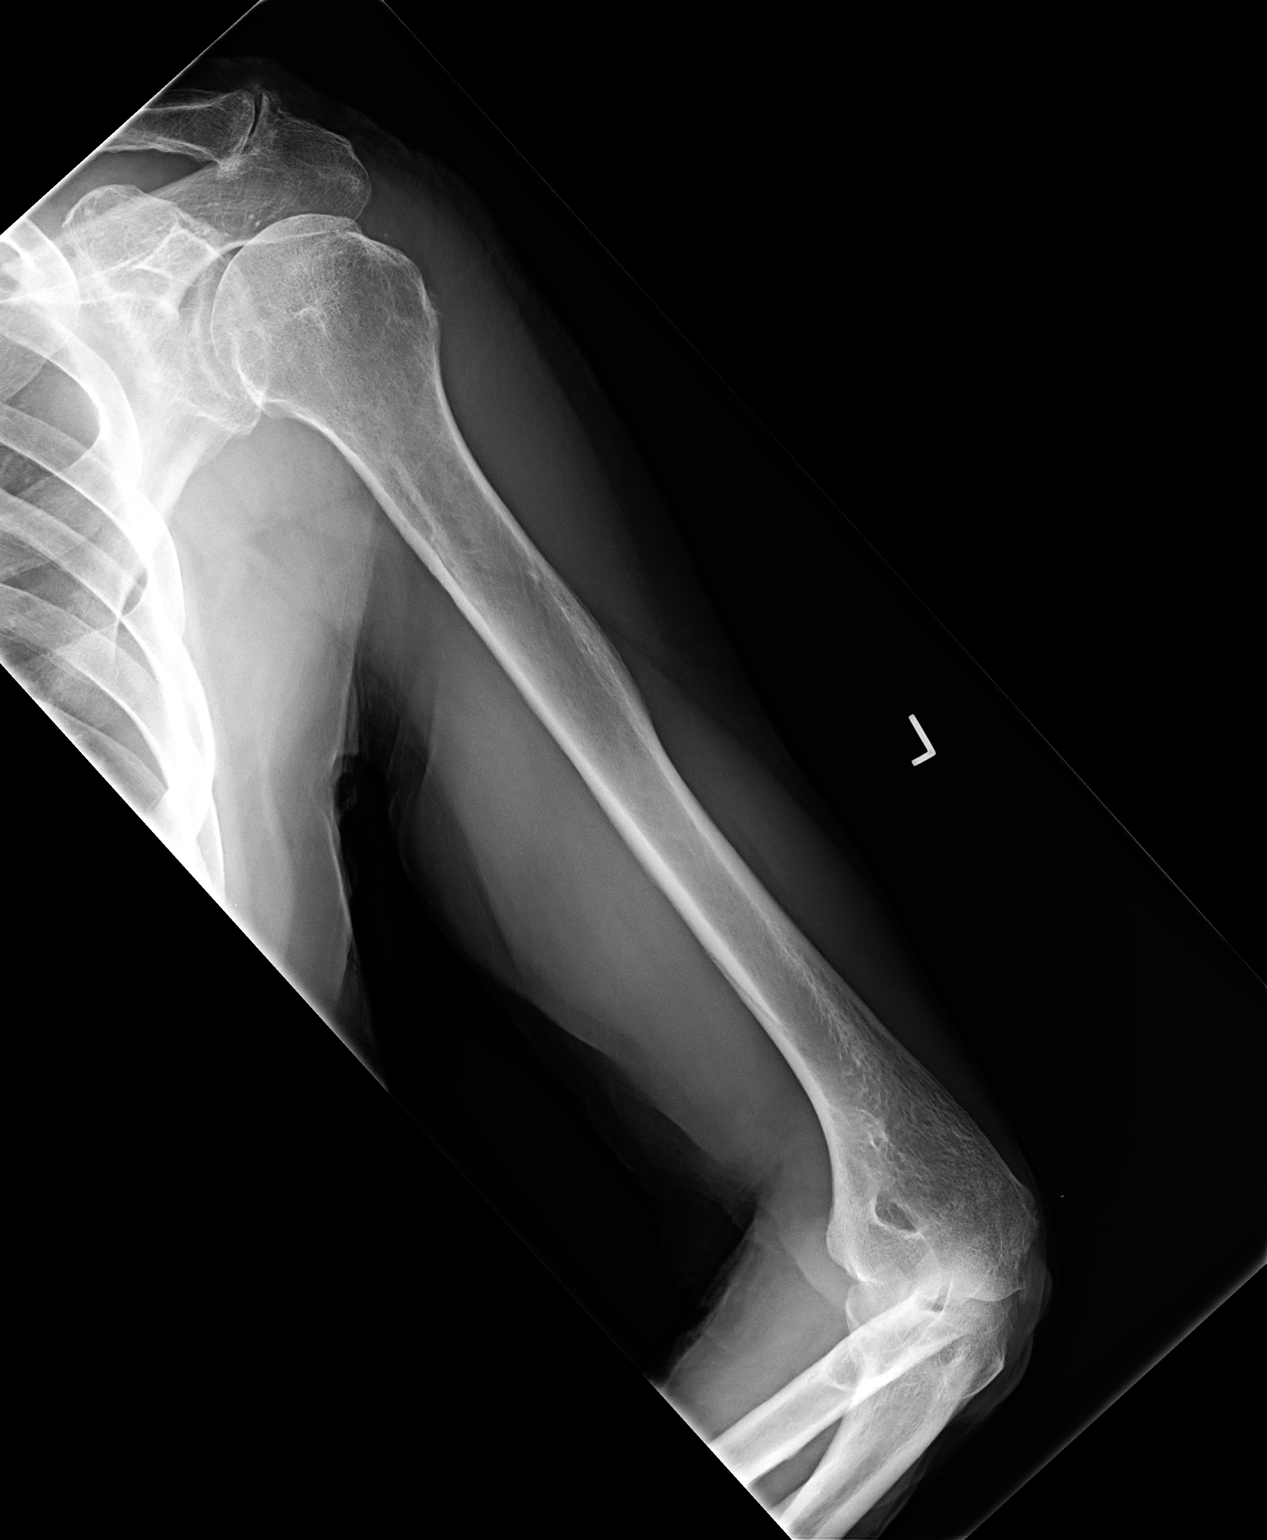

[2 of 2 positions shown; findings below may reference images not displayed]

FINDINGS: No acute fracture or dislocation is noted. Mild degenerative changes
are noted at the acromioclavicular joint. No definitive soft tissue
abnormality is noted.
IMPRESSION: No acute abnormality seen.

## 2015-05-20 DIAGNOSIS — H2511 Age-related nuclear cataract, right eye: Secondary | ICD-10-CM | POA: Diagnosis not present

## 2015-05-25 DIAGNOSIS — H2511 Age-related nuclear cataract, right eye: Secondary | ICD-10-CM | POA: Diagnosis not present

## 2015-05-25 DIAGNOSIS — H25811 Combined forms of age-related cataract, right eye: Secondary | ICD-10-CM | POA: Diagnosis not present

## 2015-05-25 DIAGNOSIS — H5231 Anisometropia: Secondary | ICD-10-CM | POA: Diagnosis not present

## 2015-06-03 HISTORY — PX: CATARACT EXTRACTION, BILATERAL: SHX1313

## 2015-07-01 DIAGNOSIS — H1132 Conjunctival hemorrhage, left eye: Secondary | ICD-10-CM | POA: Diagnosis not present

## 2015-08-20 ENCOUNTER — Other Ambulatory Visit: Payer: Medicare Other

## 2015-08-20 DIAGNOSIS — N400 Enlarged prostate without lower urinary tract symptoms: Secondary | ICD-10-CM

## 2015-08-20 DIAGNOSIS — E559 Vitamin D deficiency, unspecified: Secondary | ICD-10-CM

## 2015-08-20 DIAGNOSIS — E785 Hyperlipidemia, unspecified: Secondary | ICD-10-CM | POA: Diagnosis not present

## 2015-08-21 LAB — BMP8+EGFR
BUN / CREAT RATIO: 25 — AB (ref 10–24)
BUN: 20 mg/dL (ref 8–27)
CO2: 26 mmol/L (ref 18–29)
CREATININE: 0.79 mg/dL (ref 0.76–1.27)
Calcium: 9.5 mg/dL (ref 8.6–10.2)
Chloride: 100 mmol/L (ref 96–106)
GFR calc Af Amer: 97 mL/min/{1.73_m2} (ref >59)
GFR, EST NON AFRICAN AMERICAN: 84 mL/min/{1.73_m2} (ref >59)
Glucose: 105 mg/dL — ABNORMAL HIGH (ref 65–99)
POTASSIUM: 4.3 mmol/L (ref 3.5–5.2)
SODIUM: 141 mmol/L (ref 134–144)

## 2015-08-21 LAB — NMR, LIPOPROFILE
Cholesterol: 180 mg/dL (ref 100–199)
HDL Cholesterol by NMR: 55 mg/dL (ref >39)
HDL Particle Number: 33.9 umol/L (ref >=30.5)
LDL PARTICLE NUMBER: 1228 nmol/L — AB (ref <1000)
LDL SIZE: 21.1 nm (ref >20.5)
LDL-C: 113 mg/dL — AB (ref 0–99)
Small LDL Particle Number: 271 nmol/L (ref <=527)
Triglycerides by NMR: 62 mg/dL (ref 0–149)

## 2015-08-21 LAB — CBC WITH DIFFERENTIAL/PLATELET
BASOS ABS: 0 10*3/uL (ref 0.0–0.2)
Basos: 0 %
EOS (ABSOLUTE): 0.1 10*3/uL (ref 0.0–0.4)
EOS: 2 %
HEMATOCRIT: 50.3 % (ref 37.5–51.0)
HEMOGLOBIN: 16.8 g/dL (ref 12.6–17.7)
Immature Grans (Abs): 0 10*3/uL (ref 0.0–0.1)
Immature Granulocytes: 0 %
LYMPHS ABS: 1.6 10*3/uL (ref 0.7–3.1)
Lymphs: 25 %
MCH: 30.9 pg (ref 26.6–33.0)
MCHC: 33.4 g/dL (ref 31.5–35.7)
MCV: 93 fL (ref 79–97)
MONOS ABS: 0.5 10*3/uL (ref 0.1–0.9)
Monocytes: 8 %
NEUTROS PCT: 65 %
Neutrophils Absolute: 4.2 10*3/uL (ref 1.4–7.0)
Platelets: 210 10*3/uL (ref 150–379)
RBC: 5.43 x10E6/uL (ref 4.14–5.80)
RDW: 13.4 % (ref 12.3–15.4)
WBC: 6.6 10*3/uL (ref 3.4–10.8)

## 2015-08-21 LAB — HEPATIC FUNCTION PANEL
ALBUMIN: 4.4 g/dL (ref 3.5–4.7)
ALK PHOS: 80 IU/L (ref 39–117)
ALT: 18 IU/L (ref 0–44)
AST: 15 IU/L (ref 0–40)
Bilirubin Total: 1.4 mg/dL — ABNORMAL HIGH (ref 0.0–1.2)
Bilirubin, Direct: 0.33 mg/dL (ref 0.00–0.40)
Total Protein: 6.7 g/dL (ref 6.0–8.5)

## 2015-08-21 LAB — VITAMIN D 25 HYDROXY (VIT D DEFICIENCY, FRACTURES): VIT D 25 HYDROXY: 49.8 ng/mL (ref 30.0–100.0)

## 2015-08-21 LAB — PSA, TOTAL AND FREE
PROSTATE SPECIFIC AG, SERUM: 4 ng/mL (ref 0.0–4.0)
PSA FREE PCT: 25.8 %
PSA FREE: 1.03 ng/mL

## 2015-09-01 ENCOUNTER — Encounter: Payer: Self-pay | Admitting: *Deleted

## 2015-09-01 ENCOUNTER — Ambulatory Visit (INDEPENDENT_AMBULATORY_CARE_PROVIDER_SITE_OTHER): Payer: Medicare Other | Admitting: Family Medicine

## 2015-09-01 ENCOUNTER — Encounter (INDEPENDENT_AMBULATORY_CARE_PROVIDER_SITE_OTHER): Payer: Self-pay

## 2015-09-01 ENCOUNTER — Encounter: Payer: Self-pay | Admitting: Family Medicine

## 2015-09-01 VITALS — BP 97/57 | HR 65 | Temp 96.8°F | Ht 69.0 in | Wt 171.0 lb

## 2015-09-01 DIAGNOSIS — R972 Elevated prostate specific antigen [PSA]: Secondary | ICD-10-CM

## 2015-09-01 DIAGNOSIS — Z8719 Personal history of other diseases of the digestive system: Secondary | ICD-10-CM

## 2015-09-01 DIAGNOSIS — N400 Enlarged prostate without lower urinary tract symptoms: Secondary | ICD-10-CM

## 2015-09-01 DIAGNOSIS — E785 Hyperlipidemia, unspecified: Secondary | ICD-10-CM | POA: Diagnosis not present

## 2015-09-01 DIAGNOSIS — E559 Vitamin D deficiency, unspecified: Secondary | ICD-10-CM | POA: Diagnosis not present

## 2015-09-01 DIAGNOSIS — Z9889 Other specified postprocedural states: Secondary | ICD-10-CM | POA: Diagnosis not present

## 2015-09-01 DIAGNOSIS — Z1211 Encounter for screening for malignant neoplasm of colon: Secondary | ICD-10-CM

## 2015-09-01 NOTE — Patient Instructions (Addendum)
Medicare Annual Wellness Visit  [facility] and the medical providers at [facility] strive to bring you the best medical care.  In doing so we not only want to address your current medical conditions and concerns but also to detect new conditions early and prevent illness, disease and health-related problems.    Medicare offers a yearly Wellness Visit which allows our clinical staff to assess your need for preventative services including immunizations, lifestyle education, counseling to decrease risk of preventable diseases and screening for fall risk and other medical concerns.    This visit is provided free of charge (no copay) for all Medicare recipients. The clinical pharmacists at [facility] have begun to conduct these Wellness Visits which will also include a thorough review of all your medications.    As you primary medical provider recommend that you make an appointment for your Annual Wellness Visit if you have not done so already this year.  You may set up this appointment before you leave today or you may call back [phone] and schedule an appointment.  Please make sure when you call that you mention that you are scheduling your Annual Wellness Visit with the clinical pharmacist so that the appointment may be made for the proper length of time.     Continue current medications. Continue good therapeutic lifestyle changes which include good diet and exercise. Fall precautions discussed with patient. If an FOBT was given today- please return it to our front desk. If you are over 75 years old - you may need Prevnar 109 or the adult Pneumonia vaccine.  **Flu shots are available--- please call and schedule a FLU-CLINIC appointment**  After your visit with [place] today you will receive a survey in the mail or online from [employer] regarding your care with [place]. Please take a moment to fill this out. Your feedback is very  important to [place] as you can help [place] better understand your patient needs as well as improve your experience and satisfaction. WE CARE ABOUT YOU!!!   Take ibuprofen or Aleve 1 twice daily after breakfast and supper for a couple weeks on a regular basis because of stiffness and neck Apply cortisone 10 2 skin lesions on back sparingly twice daily for a couple weeks to see if this helps them to resolve, if the skin lesions persist biopsy may be necessary. Increase physical activity, eat less ice cream and watch diet more closely to help or weight and improve cholesterol numbers Take lab work with you to your next visit to the urologist

## 2015-09-01 NOTE — Progress Notes (Signed)
Subjective:    Patient ID: Cameron Moh., male    DOB: Aug 16, 1932, 82 y.o.   MRN: [id number]  HPI Pt here for follow up and management of chronic medical problems which includes hyperlipidemia. He is taking medications regularly.The patient does complain today of some neck pain and soreness. He is also concerned about a lesion on his back. He will begin an FOBT to return and he has had lab work which we will review with him during the visit today. His cholesterol numbers have increased. He admits to not watching his diet as closely and not getting as much exercise. His weight is up 3 pounds. He's has some skin lesions on his back and he sees the dermatologist yearly. These lesions itch a lot and there are more on the right side than the left. The neck stiffness is worse recently with turning head from left to right. He normally takes a couple of ibuprofen before playing golf. He is not been taking ibuprofen regularly. He denies any chest pain or shortness of breath. He denies any trouble with heartburn indigestion nausea vomiting diarrhea or blood in the stool. He is passing his water without problems. He sees the urologist regularly and this visit will come in October or November.     Patient Active Problem List   Diagnosis Date Noted  . Arthralgia of hands, bilateral 02/13/2014  . Benign prostatic hypertrophy 05/23/2013  . Vitamin D deficiency 04/11/2013  . Postoperative anemia due to acute blood loss 05/18/2012  . Gilberts syndrome   . Inguinal hernia recurrent unilateral   . Elevated PSA   . Erectile dysfunction   . Hyperlipidemia   . Osteoarthrosis and allied disorders    Outpatient Encounter Prescriptions as of 09/01/2015  Medication Sig  . aspirin 81 MG tablet Take 81 mg by mouth daily.  . cholecalciferol (VITAMIN D) 1000 UNITS tablet Take 1,000 Units by mouth daily. Vit d 3- takes  . Coenzyme Q10 200 MG capsule Take 200 mg by mouth every other day.  Marland Kitchen EPINEPHrine 0.3 mg/0.3 mL  IJ SOAJ injection Inject 0.3 mLs (0.3 mg total) into the muscle once.  . Flaxseed, Linseed, (FLAX SEED OIL) 1000 MG CAPS Take 1 capsule by mouth daily.  Marland Kitchen glucosamine-chondroitin 500-400 MG tablet Take 1 tablet by mouth daily.   Marland Kitchen ibuprofen (ADVIL,MOTRIN) 200 MG tablet Take 200 mg by mouth every 6 (six) hours as needed.   No facility-administered encounter medications on file as of 09/01/2015.      Review of Systems  Constitutional: Negative.   HENT: Negative.   Eyes: Negative.   Respiratory: Negative.   Cardiovascular: Negative.   Gastrointestinal: Negative.   Endocrine: Negative.   Genitourinary: Negative.   Musculoskeletal: Positive for neck pain.  Skin: Negative.        Lesion on back  Allergic/Immunologic: Negative.   Neurological: Negative.   Hematological: Negative.   Psychiatric/Behavioral: Negative.        Objective:   Physical Exam  Constitutional: He is oriented to person, place, and time. He appears well-developed and well-nourished. No distress.  HENT:  Head: Normocephalic and atraumatic.  Right Ear: External ear normal.  Left Ear: External ear normal.  Mouth/Throat: Oropharynx is clear and moist. No oropharyngeal exudate.  Eyes: Conjunctivae and EOM are normal. Pupils are equal, round, and reactive to light. Right eye exhibits no discharge. Left eye exhibits no discharge. No scleral icterus.  He is had bilateral cataracts removed in the past few months.  Neck: Normal range of motion. Neck supple. No thyromegaly present.  Some minimal stiffness with turning head to either side. There is no neuropathy.  Cardiovascular: Normal rate, regular rhythm, normal heart sounds and intact distal pulses.   No murmur heard. The heart has a regular rate and rhythm at 72/m.  Pulmonary/Chest: Effort normal and breath sounds normal. No respiratory distress. He has no wheezes. He has no rales. He exhibits no tenderness.  Clear anteriorly and posteriorly and no axillary adenopathy    Abdominal: Soft. Bowel sounds are normal. He exhibits no mass. There is no tenderness. There is no rebound and no guarding.  No inguinal adenopathy. The patient continues to have bilateral inguinal hernias and has been followed by the surgeon for these.  Genitourinary:  The patient sees the urologist regularly. He has an upcoming appointment in the fall.  Musculoskeletal: Normal range of motion. He exhibits no edema or tenderness.  Fairly good range of motion with the head and neck.  Lymphadenopathy:    He has no cervical adenopathy.  Neurological: He is alert and oriented to person, place, and time. He has normal reflexes. No cranial nerve deficit.  Skin: Skin is warm and dry. No rash noted.  There are a couple of psoriatic-type skin lesions on his left flank not too far from the midline. There is redness with the scaling. I suggested to him that he try some cortisone 10 only skin lesions.  Psychiatric: He has a normal mood and affect. His behavior is normal. Judgment and thought content normal.  Nursing note and vitals reviewed.  BP 97/57 mmHg  Pulse 65  Temp(Src) 96.8 F (36 C) (Oral)  Ht 5\' 9"  (1.753 m)  Wt 171 lb (77.565 kg)  BMI 25.24 kg/m2        Assessment & Plan:  1. Gilberts syndrome -The patient is aware of this and no change in treatment is recommended  2. Hyperlipidemia -Cholesterol numbers are slightly higher than usual. He admits to eating ice cream and not getting as much exercise and he will try to do better with diet and exercise for future checks.  3. Vitamin D deficiency -Continue with vitamin D replacement  4. Special screening for malignant neoplasms, colon -Return the FOBT before starting anti-inflammatory medicine for neck - Fecal occult blood, imunochemical; Future  5. Benign prostatic hypertrophy -Continue to follow-up with urology  6. Elevated PSA -Continue to follow-up with urology  7. Bilateral inguinal hernias -Continue to follow-up with  surgery  Patient Instructions                       Medicare Annual Wellness Visit  [facility] and the medical providers at [facility] strive to bring you the best medical care.  In doing so we not only want to address your current medical conditions and concerns but also to detect new conditions early and prevent illness, disease and health-related problems.    Medicare offers a yearly Wellness Visit which allows our clinical staff to assess your need for preventative services including immunizations, lifestyle education, counseling to decrease risk of preventable diseases and screening for fall risk and other medical concerns.    This visit is provided free of charge (no copay) for all Medicare recipients. The clinical pharmacists at [facility] have begun to conduct these Wellness Visits which will also include a thorough review of all your medications.    As you primary medical provider recommend that you  make an appointment for your Annual Wellness Visit if you have not done so already this year.  You may set up this appointment before you leave today or you may call back [phone] and schedule an appointment.  Please make sure when you call that you mention that you are scheduling your Annual Wellness Visit with the clinical pharmacist so that the appointment may be made for the proper length of time.     Continue current medications. Continue good therapeutic lifestyle changes which include good diet and exercise. Fall precautions discussed with patient. If an FOBT was given today- please return it to our front desk. If you are over 97 years old - you may need Prevnar 62 or the adult Pneumonia vaccine.  **Flu shots are available--- please call and schedule a FLU-CLINIC appointment**  After your visit with [place] today you will receive a survey in the mail or online from [employer] regarding your care with [place]. Please take a moment to fill this  out. Your feedback is very important to [place] as you can help [place] better understand your patient needs as well as improve your experience and satisfaction. WE CARE ABOUT YOU!!!   Take ibuprofen or Aleve 1 twice daily after breakfast and supper for a couple weeks on a regular basis because of stiffness and neck Apply cortisone 10 2 skin lesions on back sparingly twice daily for a couple weeks to see if this helps them to resolve, if the skin lesions persist biopsy may be necessary. Increase physical activity, eat less ice cream and watch diet more closely to help or weight and improve cholesterol numbers Take lab work with you to your next visit to the urologist    Arrie Senate MD

## 2015-09-03 ENCOUNTER — Other Ambulatory Visit: Payer: Medicare Other

## 2015-09-03 DIAGNOSIS — Z1211 Encounter for screening for malignant neoplasm of colon: Secondary | ICD-10-CM | POA: Diagnosis not present

## 2015-09-04 LAB — FECAL OCCULT BLOOD, IMMUNOCHEMICAL: FECAL OCCULT BLD: NEGATIVE

## 2015-09-28 ENCOUNTER — Telehealth: Payer: Self-pay | Admitting: Family Medicine

## 2015-09-28 DIAGNOSIS — M542 Cervicalgia: Secondary | ICD-10-CM

## 2015-09-28 DIAGNOSIS — Z91030 Bee allergy status: Secondary | ICD-10-CM

## 2015-09-28 MED ORDER — EPINEPHRINE 0.3 MG/0.3ML IJ SOAJ: 0.3000 mg | Freq: Once | INTRAMUSCULAR | Status: DC

## 2015-09-28 NOTE — Telephone Encounter (Signed)
Patient called stating he needs a refill on epi pen.  Refill sent.  Patient also states that he is still having neck pain and wodonuld like to have a referral.

## 2015-09-28 NOTE — Addendum Note (Signed)
Addended by: Wardell Heath on: 09/28/2015 01:31 PM   Modules accepted: Orders

## 2015-09-28 NOTE — Telephone Encounter (Signed)
Have patient come by and get C-spine films----once we obtain these we can go from there and decide if he needs to see a neurosurgeon or an orthopedic surgeon. Refill EpiPen

## 2015-09-28 NOTE — Telephone Encounter (Signed)
Patient aware that he will need to come in to have xray of spine.  Orders placed.

## 2015-09-29 ENCOUNTER — Other Ambulatory Visit (INDEPENDENT_AMBULATORY_CARE_PROVIDER_SITE_OTHER): Payer: Medicare Other

## 2015-09-29 ENCOUNTER — Other Ambulatory Visit: Payer: Self-pay | Admitting: *Deleted

## 2015-09-29 DIAGNOSIS — G629 Polyneuropathy, unspecified: Secondary | ICD-10-CM

## 2015-09-29 DIAGNOSIS — M542 Cervicalgia: Secondary | ICD-10-CM

## 2015-09-29 DIAGNOSIS — Z91030 Bee allergy status: Secondary | ICD-10-CM

## 2015-09-29 MED ORDER — EPINEPHRINE 0.3 MG/0.3ML IJ SOAJ: 0.3000 mg | Freq: Once | INTRAMUSCULAR | Status: DC

## 2015-10-09 ENCOUNTER — Ambulatory Visit
Admission: RE | Admit: 2015-10-09 | Discharge: 2015-10-09 | Disposition: A | Discharge status: Home / Self Care | Payer: Medicare Other | Source: Ambulatory Visit | Attending: Family Medicine | Admitting: Family Medicine

## 2015-10-09 DIAGNOSIS — M542 Cervicalgia: Secondary | ICD-10-CM

## 2015-10-09 DIAGNOSIS — G629 Polyneuropathy, unspecified: Secondary | ICD-10-CM

## 2015-10-09 DIAGNOSIS — M50222 Other cervical disc displacement at C5-C6 level: Secondary | ICD-10-CM | POA: Diagnosis not present

## 2015-10-09 DIAGNOSIS — M50223 Other cervical disc displacement at C6-C7 level: Secondary | ICD-10-CM | POA: Diagnosis not present

## 2015-10-12 ENCOUNTER — Other Ambulatory Visit: Payer: Self-pay | Admitting: *Deleted

## 2015-10-12 DIAGNOSIS — M4802 Spinal stenosis, cervical region: Secondary | ICD-10-CM

## 2015-10-12 NOTE — Addendum Note (Signed)
Addended by: Thana Ates on: 10/12/2015 11:37 AM   Modules accepted: Orders

## 2015-10-15 ENCOUNTER — Ambulatory Visit
Admission: RE | Admit: 2015-10-15 | Discharge: 2015-10-15 | Disposition: A | Discharge status: Home / Self Care | Payer: Medicare Other | Source: Ambulatory Visit | Attending: Family Medicine | Admitting: Family Medicine

## 2015-10-15 DIAGNOSIS — M50323 Other cervical disc degeneration at C6-C7 level: Secondary | ICD-10-CM | POA: Diagnosis not present

## 2015-10-15 DIAGNOSIS — M4802 Spinal stenosis, cervical region: Secondary | ICD-10-CM

## 2015-10-16 ENCOUNTER — Other Ambulatory Visit: Payer: Self-pay

## 2015-10-16 DIAGNOSIS — M542 Cervicalgia: Secondary | ICD-10-CM

## 2015-11-03 DIAGNOSIS — N401 Enlarged prostate with lower urinary tract symptoms: Secondary | ICD-10-CM | POA: Diagnosis not present

## 2015-11-03 DIAGNOSIS — R972 Elevated prostate specific antigen [PSA]: Secondary | ICD-10-CM | POA: Diagnosis not present

## 2015-11-03 DIAGNOSIS — R351 Nocturia: Secondary | ICD-10-CM | POA: Diagnosis not present

## 2015-12-01 DIAGNOSIS — M542 Cervicalgia: Secondary | ICD-10-CM | POA: Insufficient documentation

## 2016-02-29 ENCOUNTER — Telehealth: Payer: Self-pay | Admitting: Family Medicine

## 2016-02-29 DIAGNOSIS — E559 Vitamin D deficiency, unspecified: Secondary | ICD-10-CM

## 2016-02-29 DIAGNOSIS — E7800 Pure hypercholesterolemia, unspecified: Secondary | ICD-10-CM

## 2016-03-01 ENCOUNTER — Ambulatory Visit (INDEPENDENT_AMBULATORY_CARE_PROVIDER_SITE_OTHER): Payer: Medicare Other | Admitting: *Deleted

## 2016-03-01 ENCOUNTER — Other Ambulatory Visit (INDEPENDENT_AMBULATORY_CARE_PROVIDER_SITE_OTHER): Payer: Medicare Other

## 2016-03-01 DIAGNOSIS — E559 Vitamin D deficiency, unspecified: Secondary | ICD-10-CM

## 2016-03-01 DIAGNOSIS — E7800 Pure hypercholesterolemia, unspecified: Secondary | ICD-10-CM | POA: Diagnosis not present

## 2016-03-01 DIAGNOSIS — Z23 Encounter for immunization: Secondary | ICD-10-CM

## 2016-03-02 LAB — NMR, LIPOPROFILE
Cholesterol: 172 mg/dL (ref 100–199)
HDL CHOLESTEROL BY NMR: 53 mg/dL (ref >39)
HDL Particle Number: 33.2 umol/L (ref >=30.5)
LDL PARTICLE NUMBER: 1275 nmol/L — AB (ref <1000)
LDL Size: 20.8 nm (ref >20.5)
LDL-C: 106 mg/dL — AB (ref 0–99)
LP-IR SCORE: 26 (ref <=45)
Small LDL Particle Number: 391 nmol/L (ref <=527)
Triglycerides by NMR: 64 mg/dL (ref 0–149)

## 2016-03-02 LAB — BMP8+EGFR
BUN/Creatinine Ratio: 23 (ref 10–24)
BUN: 21 mg/dL (ref 8–27)
CALCIUM: 9.7 mg/dL (ref 8.6–10.2)
CHLORIDE: 100 mmol/L (ref 96–106)
CO2: 27 mmol/L (ref 18–29)
Creatinine, Ser: 0.9 mg/dL (ref 0.76–1.27)
GFR calc Af Amer: 92 mL/min/{1.73_m2} (ref >59)
GFR, EST NON AFRICAN AMERICAN: 80 mL/min/{1.73_m2} (ref >59)
GLUCOSE: 100 mg/dL — AB (ref 65–99)
POTASSIUM: 4.5 mmol/L (ref 3.5–5.2)
SODIUM: 143 mmol/L (ref 134–144)

## 2016-03-10 ENCOUNTER — Ambulatory Visit: Payer: Medicare Other | Admitting: Family Medicine

## 2016-03-15 DIAGNOSIS — R030 Elevated blood-pressure reading, without diagnosis of hypertension: Secondary | ICD-10-CM | POA: Diagnosis not present

## 2016-03-15 DIAGNOSIS — M542 Cervicalgia: Secondary | ICD-10-CM | POA: Diagnosis not present

## 2016-03-15 DIAGNOSIS — Z681 Body mass index (BMI) 19 or less, adult: Secondary | ICD-10-CM | POA: Diagnosis not present

## 2016-03-15 DIAGNOSIS — M532X1 Spinal instabilities, occipito-atlanto-axial region: Secondary | ICD-10-CM | POA: Diagnosis not present

## 2016-03-23 ENCOUNTER — Other Ambulatory Visit: Payer: Self-pay | Admitting: Neurosurgery

## 2016-03-24 DIAGNOSIS — G5603 Carpal tunnel syndrome, bilateral upper limbs: Secondary | ICD-10-CM | POA: Diagnosis not present

## 2016-03-24 DIAGNOSIS — M65331 Trigger finger, right middle finger: Secondary | ICD-10-CM | POA: Diagnosis not present

## 2016-04-07 ENCOUNTER — Encounter: Payer: Self-pay | Admitting: Family Medicine

## 2016-04-07 ENCOUNTER — Ambulatory Visit (INDEPENDENT_AMBULATORY_CARE_PROVIDER_SITE_OTHER): Payer: Medicare Other | Admitting: Family Medicine

## 2016-04-07 VITALS — BP 112/63 | HR 61 | Temp 97.1°F | Ht 69.0 in | Wt 171.0 lb

## 2016-04-07 DIAGNOSIS — M542 Cervicalgia: Secondary | ICD-10-CM | POA: Diagnosis not present

## 2016-04-07 DIAGNOSIS — E559 Vitamin D deficiency, unspecified: Secondary | ICD-10-CM

## 2016-04-07 DIAGNOSIS — L821 Other seborrheic keratosis: Secondary | ICD-10-CM | POA: Diagnosis not present

## 2016-04-07 DIAGNOSIS — E7800 Pure hypercholesterolemia, unspecified: Secondary | ICD-10-CM

## 2016-04-07 DIAGNOSIS — L820 Inflamed seborrheic keratosis: Secondary | ICD-10-CM | POA: Diagnosis not present

## 2016-04-07 DIAGNOSIS — R972 Elevated prostate specific antigen [PSA]: Secondary | ICD-10-CM | POA: Diagnosis not present

## 2016-04-07 DIAGNOSIS — Z85828 Personal history of other malignant neoplasm of skin: Secondary | ICD-10-CM | POA: Diagnosis not present

## 2016-04-07 DIAGNOSIS — D1801 Hemangioma of skin and subcutaneous tissue: Secondary | ICD-10-CM | POA: Diagnosis not present

## 2016-04-07 DIAGNOSIS — L570 Actinic keratosis: Secondary | ICD-10-CM | POA: Diagnosis not present

## 2016-04-07 DIAGNOSIS — L918 Other hypertrophic disorders of the skin: Secondary | ICD-10-CM | POA: Diagnosis not present

## 2016-04-07 NOTE — Patient Instructions (Signed)
Continue with aggressive therapeutic lifestyle changes Follow-up with surgery as planned for neck Stay active until that time

## 2016-04-07 NOTE — Progress Notes (Signed)
Subjective:    Patient ID: Cameron Moh., male    DOB: 05-25-1932, 82 y.o.   MRN: [id number]  HPI Pt here for follow up and management of chronic medical problems which includes hyperlipidemia. He is taking medication regularly.The patient is scheduled for a posterior C1-C2 surgery for his ongoing neck pain that is predisposing him to possible pathologic fracture. We'll have instrumentation and fusion. Given her for early January. The patient has had lab work is been returned. His total LDL particle number is elevated at 1275. LDL C is elevated at 106. Triglycerides are good at 64 and the good cholesterol is within normal limits. The blood sugar is slightly elevated at 100 and the creatinine, the most important kidney function test is within normal limits. Electrolytes are good. Patient denies any chest pain pressure tightness or shortness of breath. He has no trouble with his GI tract nausea vomiting diarrhea or blood in the stool. He is passing his water well. He is followed regularly by the urologist. He had a TURP sometime in the recent past. He does complain of this right inguinal hernia and he does not have any regular pain with this but he has seen the surgeon and he understands that if he has increasing frequently occurring bouts of pain that he should go back and see the surgeon. He is scheduled for the next surgery the first or January 2. He will be in the hospital for couple of days. When his lab work was reviewed and we discussed with him about a little bit more aggressive management of the cholesterol. We will plan to see him back sometime in 3-4 months and at that time we'll address the cholesterol issue and if the numbers remain high we consider putting on a low dose or trying a low-dose of a statin drug. He is okay with this.    Patient Active Problem List   Diagnosis Date Noted  . History of bilateral inguinal hernias 09/01/2015  . Arthralgia of hands, bilateral 02/13/2014  .  Benign prostatic hypertrophy 05/23/2013  . Vitamin D deficiency 04/11/2013  . Postoperative anemia due to acute blood loss 05/18/2012  . Gilberts syndrome   . Inguinal hernia recurrent unilateral   . Elevated PSA   . Erectile dysfunction   . Hyperlipidemia   . Osteoarthrosis and allied disorders    Outpatient Encounter Prescriptions as of 04/07/2016  Medication Sig  . aspirin 81 MG tablet Take 81 mg by mouth daily.  . cholecalciferol (VITAMIN D) 1000 UNITS tablet Take 1,000 Units by mouth daily. Vit d 3- takes  . Coenzyme Q10 200 MG capsule Take 200 mg by mouth every other day.  . Flaxseed, Linseed, (FLAX SEED OIL) 1000 MG CAPS Take 1 capsule by mouth daily.  Marland Kitchen glucosamine-chondroitin 500-400 MG tablet Take 1 tablet by mouth daily.   Marland Kitchen ibuprofen (ADVIL,MOTRIN) 200 MG tablet Take 200 mg by mouth every 6 (six) hours as needed.  Marland Kitchen EPINEPHrine 0.3 mg/0.3 mL IJ SOAJ injection Inject 0.3 mLs (0.3 mg total) into the muscle once. (Patient not taking: Reported on 04/07/2016)   No facility-administered encounter medications on file as of 04/07/2016.       Review of Systems  Constitutional: Negative.   HENT: Negative.   Eyes: Negative.   Respiratory: Negative.   Cardiovascular: Negative.   Gastrointestinal: Negative.   Endocrine: Negative.   Genitourinary: Negative.   Musculoskeletal: Negative.   Skin: Negative.   Allergic/Immunologic: Negative.   Neurological: Negative.  Hematological: Negative.   Psychiatric/Behavioral: Negative.        Objective:   Physical Exam  Constitutional: He is oriented to person, place, and time. He appears well-developed and well-nourished. No distress.  HENT:  Head: Normocephalic and atraumatic.  Right Ear: External ear normal.  Left Ear: External ear normal.  Nose: Nose normal.  Mouth/Throat: Oropharynx is clear and moist. No oropharyngeal exudate.  Eyes: Conjunctivae and EOM are normal. Pupils are equal, round, and reactive to light. Right eye  exhibits no discharge. Left eye exhibits no discharge. No scleral icterus.  Neck: Normal range of motion. Neck supple. No thyromegaly present.  The patient is cautious with his neck movements. He still plays golf. He has partial bilateral rotator cuff tears. He is going to do this next surgery because of the chance if he was in a severe accident he could have a much worse problem.  Cardiovascular: Normal rate, regular rhythm, normal heart sounds and intact distal pulses.   No murmur heard. Pulmonary/Chest: Effort normal and breath sounds normal. No respiratory distress. He has no wheezes. He has no rales. He exhibits no tenderness.  Abdominal: Soft. Bowel sounds are normal. He exhibits no mass. There is no tenderness. There is no rebound and no guarding.  No liver or spleen enlargement no masses and no bruits. There is a small direct hernia in the right inguinal area which is palpable. The patient understands to follow-up with this if problems continue.  Musculoskeletal: Normal range of motion. He exhibits no edema.  Limited range of motion of neck  Lymphadenopathy:    He has no cervical adenopathy.  Neurological: He is alert and oriented to person, place, and time. He has normal reflexes. No cranial nerve deficit.  Skin: Skin is warm and dry. No rash noted.  Psychiatric: He has a normal mood and affect. His behavior is normal. Judgment and thought content normal.  Nursing note and vitals reviewed.   BP 112/63 (BP Location: Left Arm)   Pulse 61   Temp 97.1 F (36.2 C) (Oral)   Ht 5\' 9"  (1.753 m)   Wt 171 lb (77.6 kg)   BMI 25.25 kg/m        Assessment & Plan:   1. Pure hypercholesterolemia -Continue with aggressive therapeutic lifestyle changes as much as possible with upcoming neck surgery. -Recheck patient in 3-4 months and recheck cholesterol if the numbers remain elevated because of aortic atherosclerosis we should consider trying a low-dose statin at that time  2. Vitamin D  deficiency -Continue current treatment  3. Elevated PSA -Follow-up with urology as planned the last visit was 3-4 months ago he does not plan to see the urologist until the all of 2018.  4. Neck pain -Follow-up for surgery as planned  Patient Instructions  Continue with aggressive therapeutic lifestyle changes Follow-up with surgery as planned for neck Stay active until that time   Arrie Senate MD

## 2016-05-02 ENCOUNTER — Inpatient Hospital Stay (HOSPITAL_COMMUNITY): Admission: RE | Admit: 2016-05-02 | Payer: Medicare Other | Source: Ambulatory Visit

## 2016-05-09 ENCOUNTER — Inpatient Hospital Stay: Admit: 2016-05-09 | Payer: Medicare Other | Admitting: Neurosurgery

## 2016-05-09 SURGERY — POSTERIOR CERVICAL FUSION/FORAMINOTOMY LEVEL 1
Anesthesia: General

## 2016-05-26 DIAGNOSIS — K4090 Unilateral inguinal hernia, without obstruction or gangrene, not specified as recurrent: Secondary | ICD-10-CM | POA: Diagnosis not present

## 2016-05-31 DIAGNOSIS — L03031 Cellulitis of right toe: Secondary | ICD-10-CM | POA: Diagnosis not present

## 2016-06-14 DIAGNOSIS — L03031 Cellulitis of right toe: Secondary | ICD-10-CM | POA: Diagnosis not present

## 2016-06-30 DIAGNOSIS — Z961 Presence of intraocular lens: Secondary | ICD-10-CM | POA: Diagnosis not present

## 2016-06-30 DIAGNOSIS — D3131 Benign neoplasm of right choroid: Secondary | ICD-10-CM | POA: Diagnosis not present

## 2016-06-30 DIAGNOSIS — H5703 Miosis: Secondary | ICD-10-CM | POA: Diagnosis not present

## 2016-07-14 ENCOUNTER — Telehealth: Payer: Self-pay | Admitting: Family Medicine

## 2016-07-14 DIAGNOSIS — E785 Hyperlipidemia, unspecified: Secondary | ICD-10-CM

## 2016-07-14 DIAGNOSIS — E559 Vitamin D deficiency, unspecified: Secondary | ICD-10-CM

## 2016-07-14 DIAGNOSIS — R972 Elevated prostate specific antigen [PSA]: Secondary | ICD-10-CM

## 2016-07-14 NOTE — Telephone Encounter (Signed)
Pt aware orders have been placed

## 2016-07-15 ENCOUNTER — Other Ambulatory Visit (INDEPENDENT_AMBULATORY_CARE_PROVIDER_SITE_OTHER): Payer: Medicare Other

## 2016-07-15 DIAGNOSIS — R972 Elevated prostate specific antigen [PSA]: Secondary | ICD-10-CM

## 2016-07-15 DIAGNOSIS — E785 Hyperlipidemia, unspecified: Secondary | ICD-10-CM | POA: Diagnosis not present

## 2016-07-15 DIAGNOSIS — E559 Vitamin D deficiency, unspecified: Secondary | ICD-10-CM

## 2016-07-16 LAB — CBC WITH DIFFERENTIAL/PLATELET
BASOS ABS: 0 10*3/uL (ref 0.0–0.2)
Basos: 0 %
EOS (ABSOLUTE): 0.1 10*3/uL (ref 0.0–0.4)
Eos: 2 %
HEMOGLOBIN: 16.8 g/dL (ref 13.0–17.7)
Hematocrit: 49.3 % (ref 37.5–51.0)
IMMATURE GRANS (ABS): 0 10*3/uL (ref 0.0–0.1)
Immature Granulocytes: 0 %
LYMPHS: 24 %
Lymphocytes Absolute: 1.5 10*3/uL (ref 0.7–3.1)
MCH: 31.4 pg (ref 26.6–33.0)
MCHC: 34.1 g/dL (ref 31.5–35.7)
MCV: 92 fL (ref 79–97)
MONOCYTES: 8 %
Monocytes Absolute: 0.5 10*3/uL (ref 0.1–0.9)
NEUTROS PCT: 66 %
Neutrophils Absolute: 4.1 10*3/uL (ref 1.4–7.0)
Platelets: 207 10*3/uL (ref 150–379)
RBC: 5.35 x10E6/uL (ref 4.14–5.80)
RDW: 13.5 % (ref 12.3–15.4)
WBC: 6.2 10*3/uL (ref 3.4–10.8)

## 2016-07-16 LAB — NMR, LIPOPROFILE
CHOLESTEROL: 168 mg/dL (ref 100–199)
HDL CHOLESTEROL BY NMR: 51 mg/dL (ref >39)
HDL PARTICLE NUMBER: 29.1 umol/L — AB (ref >=30.5)
LDL Particle Number: 1348 nmol/L — ABNORMAL HIGH (ref <1000)
LDL Size: 20.7 nm (ref >20.5)
LDL-C: 103 mg/dL — AB (ref 0–99)
LP-IR SCORE: 30 (ref <=45)
Small LDL Particle Number: 642 nmol/L — ABNORMAL HIGH (ref <=527)
TRIGLYCERIDES BY NMR: 68 mg/dL (ref 0–149)

## 2016-07-16 LAB — BMP8+EGFR
BUN/Creatinine Ratio: 25 — ABNORMAL HIGH (ref 10–24)
BUN: 24 mg/dL (ref 8–27)
CO2: 26 mmol/L (ref 18–29)
CREATININE: 0.96 mg/dL (ref 0.76–1.27)
Calcium: 9.4 mg/dL (ref 8.6–10.2)
Chloride: 99 mmol/L (ref 96–106)
GFR calc Af Amer: 85 mL/min/{1.73_m2} (ref >59)
GFR calc non Af Amer: 73 mL/min/{1.73_m2} (ref >59)
GLUCOSE: 98 mg/dL (ref 65–99)
Potassium: 4.3 mmol/L (ref 3.5–5.2)
Sodium: 140 mmol/L (ref 134–144)

## 2016-07-16 LAB — HEPATIC FUNCTION PANEL
ALBUMIN: 4.5 g/dL (ref 3.5–4.7)
ALK PHOS: 72 IU/L (ref 39–117)
ALT: 14 IU/L (ref 0–44)
AST: 17 IU/L (ref 0–40)
Bilirubin Total: 1.8 mg/dL — ABNORMAL HIGH (ref 0.0–1.2)
Bilirubin, Direct: 0.33 mg/dL (ref 0.00–0.40)
TOTAL PROTEIN: 6.5 g/dL (ref 6.0–8.5)

## 2016-07-16 LAB — VITAMIN D 25 HYDROXY (VIT D DEFICIENCY, FRACTURES): VIT D 25 HYDROXY: 41.7 ng/mL (ref 30.0–100.0)

## 2016-07-16 LAB — PSA, TOTAL AND FREE
PSA, Free Pct: 28 %
PSA, Free: 1.12 ng/mL
Prostate Specific Ag, Serum: 4 ng/mL (ref 0.0–4.0)

## 2016-07-18 MED ORDER — ROSUVASTATIN CALCIUM 5 MG PO TABS: 5.0000 mg | ORAL_TABLET | Freq: Every day | ORAL | 0 refills | Status: DC

## 2016-07-18 NOTE — Progress Notes (Unsigned)
Patient aware and verbalizes understanding.  Rx sent.  

## 2016-08-02 ENCOUNTER — Ambulatory Visit (INDEPENDENT_AMBULATORY_CARE_PROVIDER_SITE_OTHER): Payer: Medicare Other | Admitting: Family Medicine

## 2016-08-02 ENCOUNTER — Encounter: Payer: Self-pay | Admitting: Family Medicine

## 2016-08-02 ENCOUNTER — Ambulatory Visit (INDEPENDENT_AMBULATORY_CARE_PROVIDER_SITE_OTHER): Payer: Medicare Other

## 2016-08-02 VITALS — BP 95/62 | HR 70 | Temp 96.9°F | Ht 69.0 in | Wt 169.0 lb

## 2016-08-02 DIAGNOSIS — E559 Vitamin D deficiency, unspecified: Secondary | ICD-10-CM

## 2016-08-02 DIAGNOSIS — I6529 Occlusion and stenosis of unspecified carotid artery: Secondary | ICD-10-CM

## 2016-08-02 DIAGNOSIS — E785 Hyperlipidemia, unspecified: Secondary | ICD-10-CM | POA: Diagnosis not present

## 2016-08-02 NOTE — Progress Notes (Signed)
Subjective:    Patient ID: [name], male    DOB: Aug 28, 1932, 83 y.o.   MRN: [id number]  HPI Pt here for follow up and management of chronic medical problems which includes hyperlipidemia. He is taking medication regularly.The patient was scheduled for surgery on his neck by the neurosurgeon, but this was canceled. The patient has had lab work done and this will be reviewed with him during the visit today. The PSA test was 4.0 and this is stable and consistent with a PSA that was done 11 months ago. The CBC has a normal white blood cell count. The hemoglobin was good at 16.8 and the platelet count was within normal limits. The blood sugar was good at 98. The creatinine was 0.96 and within normal limits along with all of the electrolytes including potassium. All liver function tests were within normal limits except the total bilirubin was elevated the patient has a history of your bears disease. Cholesterol numbers have crept up since the last visit with a total LDL particle number now being 1348. LDL C was slightly elevated at 103. Triglycerides are good at 68 and the good cholesterol the anterior cruciate ligament particle number was loaded this time. Vitamin D level was good at 41.7.     Patient Active Problem List   Diagnosis Date Noted  . History of bilateral inguinal hernias 09/01/2015  . Arthralgia of hands, bilateral 02/13/2014  . Benign prostatic hypertrophy 05/23/2013  . Vitamin D deficiency 04/11/2013  . Postoperative anemia due to acute blood loss 05/18/2012  . Gilberts syndrome   . Inguinal hernia recurrent unilateral   . Elevated PSA   . Erectile dysfunction   . Hyperlipidemia   . Osteoarthrosis and allied disorders    Outpatient Encounter Prescriptions as of 08/02/2016  Medication Sig  . aspirin EC 81 MG tablet Take 81 mg by mouth every evening.  . cholecalciferol (VITAMIN D) 1000 UNITS tablet Take 2,000 Units by mouth daily.   . Coenzyme Q10 200 MG capsule Take  200 mg by mouth every other day.  Marland Kitchen EPINEPHrine 0.3 mg/0.3 mL IJ SOAJ injection Inject 0.3 mLs (0.3 mg total) into the muscle once. (Patient taking differently: Inject 0.3 mg into the muscle daily as needed (for allergic reaction.). )  . Flaxseed, Linseed, (FLAX SEED OIL) 1000 MG CAPS Take 1,000 mg by mouth every evening.   . Glucosamine-Chondroitin-Vit D3 1500-1200-800 MG-MG-UNIT PACK Take 1 tablet by mouth every evening.  Marland Kitchen ibuprofen (ADVIL,MOTRIN) 200 MG tablet Take 200-400 mg by mouth daily as needed (for pain).   . naproxen sodium (ANAPROX) 220 MG tablet Take 220-440 mg by mouth daily as needed (for pain.).  Marland Kitchen rosuvastatin (CRESTOR) 5 MG tablet Take 1 tablet (5 mg total) by mouth at bedtime. (Patient not taking: Reported on 08/02/2016)   No facility-administered encounter medications on file as of 08/02/2016.      Review of Systems  Constitutional: Negative.   HENT: Negative.   Eyes: Negative.   Respiratory: Negative.   Cardiovascular: Negative.   Gastrointestinal: Negative.   Endocrine: Negative.   Genitourinary: Negative.   Musculoskeletal: Negative.   Skin: Negative.   Allergic/Immunologic: Negative.   Neurological: Negative.   Hematological: Negative.   Psychiatric/Behavioral: Negative.        Objective:   Physical Exam  Constitutional: He is oriented to person, place, and time. He appears well-developed and well-nourished.  HENT:  Head: Normocephalic and atraumatic.  Right Ear: External ear normal.  Left Ear: External  ear normal.  Nose: Nose normal.  Mouth/Throat: Oropharynx is clear and moist. No oropharyngeal exudate.  Eyes: Conjunctivae and EOM are normal. Pupils are equal, round, and reactive to light. Right eye exhibits no discharge. Left eye exhibits no discharge. No scleral icterus.  Neck: Normal range of motion. Neck supple. No thyromegaly present.  No bruits were audible today and either carotid even though a CT scan done to evaluate the neck did show some  atherosclerosis in the carotid arteries. As a result of this we will get carotid Dopplers.  Cardiovascular: Normal rate, regular rhythm, normal heart sounds and intact distal pulses.   No murmur heard. Heart is regular at 72/m  Pulmonary/Chest: Effort normal and breath sounds normal. No respiratory distress. He has no wheezes. He has no rales. He exhibits no tenderness.  No axillary adenopathy and good breath sounds anteriorly and posteriorly  Abdominal: Soft. Bowel sounds are normal. He exhibits no mass. There is no tenderness. There is no rebound and no guarding.  No abdominal tenderness or masses. No liver or spleen enlargement. No bruits.  Musculoskeletal: He exhibits no edema.  Patient does have some is cervical stiffness with head motions. He is still considering getting surgery for stabilization. He will probably do this late this spring or early in the summer.  Lymphadenopathy:    He has no cervical adenopathy.  Neurological: He is alert and oriented to person, place, and time. He has normal reflexes. No cranial nerve deficit.  Skin: Skin is warm and dry. No rash noted.  Psychiatric: He has a normal mood and affect. His behavior is normal. Judgment and thought content normal.  Nursing note and vitals reviewed.  BP 95/62 (BP Location: Left Arm)   Pulse 70   Temp (!) 96.9 F (36.1 C) (Oral)   Ht 5\' 9"  (1.753 m)   Wt 169 lb (76.7 kg)   BMI 24.96 kg/m         Assessment & Plan:  1. Hyperlipidemia, unspecified hyperlipidemia type -Add Crestor 5 mg on Monday Wednesday and Friday. This is especially in response to the fact that he has carotid artery atherosclerosis from a CT scan of the neck. No bruits were audible. -We will arrange to get carotid Dopplers in response to this. - DG Chest 2 View; Future - Hepatic function panel; Future  2. Vitamin D deficiency -Continue current treatment  3. Gilberts syndrome -The patient has been aware of this for a long time and we will  monitor liver function tests periodically. -We will recheck liver function tests after starting the Crestor and about 2 months. - DG Chest 2 View; Future  4. Carotid atherosclerosis, unspecified laterality -No bruits were audible but because of the finding on the CT scan we will get the ultrasounds. - US Carotid Duplex Bilateral; Future  5. Carotid artery calcification, unspecified laterality - US Carotid Duplex Bilateral; Future  Patient Instructions                       Medicare Annual Wellness Visit   and the medical providers at [facility] strive to bring you the best medical care.  In doing so we not only want to address your current medical conditions and concerns but also to detect new conditions early and prevent illness, disease and health-related problems.    Medicare offers a yearly Wellness Visit which allows our clinical staff to assess your need for preventative services including immunizations, lifestyle education, counseling to  decrease risk of preventable diseases and screening for fall risk and other medical concerns.    This visit is provided free of charge (no copay) for all Medicare recipients. The clinical pharmacists at [facility] have begun to conduct these Wellness Visits which will also include a thorough review of all your medications.    As you primary medical provider recommend that you make an appointment for your Annual Wellness Visit if you have not done so already this year.  You may set up this appointment before you leave today or you may call back ([phone]) and schedule an appointment.  Please make sure when you call that you mention that you are scheduling your Annual Wellness Visit with the clinical pharmacist so that the appointment may be made for the proper length of time.     Continue current medications. Continue good therapeutic lifestyle changes which include good diet and exercise. Fall  precautions discussed with patient. If an FOBT was given today- please return it to our front desk. If you are over 61 years old - you may need Prevnar 22 or the adult Pneumonia vaccine.  **Flu shots are available--- please call and schedule a FLU-CLINIC appointment**  After your visit with [place] today you will receive a survey in the mail or online from [employer] regarding your care with [place]. Please take a moment to fill this out. Your feedback is very important to [place] as you can help [place] better understand your patient needs as well as improve your experience and satisfaction. WE CARE ABOUT YOU!!!   Patient must take his Crestor at least on Monday Wednesday and Friday and repeat liver function test in a couple months. His cholesterol numbers have increased and he is aware of this. He also points out that he has these carotids that have atherosclerosis in them. We will arrange for carotid Dopplers and he will take the Crestor on Monday Wednesday and Friday. He will continue to be careful physically and not put himself at risk for falling and trying to avoid the chance of any accident because of the  instability of his neck. He will follow-up with with the neurosurgeon as planned     Arrie Senate MD

## 2016-08-02 NOTE — Patient Instructions (Addendum)
Medicare Annual Wellness Visit  [facility] and the medical providers at [facility] strive to bring you the best medical care.  In doing so we not only want to address your current medical conditions and concerns but also to detect new conditions early and prevent illness, disease and health-related problems.    Medicare offers a yearly Wellness Visit which allows our clinical staff to assess your need for preventative services including immunizations, lifestyle education, counseling to decrease risk of preventable diseases and screening for fall risk and other medical concerns.    This visit is provided free of charge (no copay) for all Medicare recipients. The clinical pharmacists at [facility] have begun to conduct these Wellness Visits which will also include a thorough review of all your medications.    As you primary medical provider recommend that you make an appointment for your Annual Wellness Visit if you have not done so already this year.  You may set up this appointment before you leave today or you may call back [phone]) and schedule an appointment.  Please make sure when you call that you mention that you are scheduling your Annual Wellness Visit with the clinical pharmacist so that the appointment may be made for the proper length of time.     Continue current medications. Continue good therapeutic lifestyle changes which include good diet and exercise. Fall precautions discussed with patient. If an FOBT was given today- please return it to our front desk. If you are over 61 years old - you may need Prevnar 29 or the adult Pneumonia vaccine.  **Flu shots are available--- please call and schedule a FLU-CLINIC appointment**  After your visit with [place] today you will receive a survey in the mail or online from [employer] regarding your care with [place]. Please take a moment to fill this out. Your feedback is very  important to [place] as you can help [place] better understand your patient needs as well as improve your experience and satisfaction. WE CARE ABOUT YOU!!!   Patient must take his Crestor at least on Monday Wednesday and Friday and repeat liver function test in a couple months. His cholesterol numbers have increased and he is aware of this. He also points out that he has these carotids that have atherosclerosis in them. We will arrange for carotid Dopplers and he will take the Crestor on Monday Wednesday and Friday. He will continue to be careful physically and not put himself at risk for falling and trying to avoid the chance of any accident because of the  instability of his neck. He will follow-up with with the neurosurgeon as planned

## 2016-08-29 ENCOUNTER — Ambulatory Visit
Admission: RE | Admit: 2016-08-29 | Discharge: 2016-08-29 | Disposition: A | Discharge status: Home / Self Care | Payer: Medicare Other | Source: Ambulatory Visit | Attending: Family Medicine | Admitting: Family Medicine

## 2016-08-29 DIAGNOSIS — I6529 Occlusion and stenosis of unspecified carotid artery: Secondary | ICD-10-CM

## 2016-08-29 DIAGNOSIS — I6523 Occlusion and stenosis of bilateral carotid arteries: Secondary | ICD-10-CM | POA: Diagnosis not present

## 2016-08-30 DIAGNOSIS — M65331 Trigger finger, right middle finger: Secondary | ICD-10-CM | POA: Diagnosis not present

## 2016-08-30 DIAGNOSIS — G5603 Carpal tunnel syndrome, bilateral upper limbs: Secondary | ICD-10-CM | POA: Insufficient documentation

## 2016-08-30 DIAGNOSIS — M65321 Trigger finger, right index finger: Secondary | ICD-10-CM | POA: Insufficient documentation

## 2016-09-09 DIAGNOSIS — L820 Inflamed seborrheic keratosis: Secondary | ICD-10-CM | POA: Diagnosis not present

## 2016-09-09 DIAGNOSIS — Z85828 Personal history of other malignant neoplasm of skin: Secondary | ICD-10-CM | POA: Diagnosis not present

## 2016-09-27 ENCOUNTER — Other Ambulatory Visit: Payer: Medicare Other

## 2016-09-27 DIAGNOSIS — E785 Hyperlipidemia, unspecified: Secondary | ICD-10-CM

## 2016-09-28 LAB — HEPATIC FUNCTION PANEL
ALK PHOS: 85 IU/L (ref 39–117)
ALT: 21 IU/L (ref 0–44)
AST: 19 IU/L (ref 0–40)
Albumin: 4.5 g/dL (ref 3.5–4.7)
BILIRUBIN TOTAL: 1.5 mg/dL — AB (ref 0.0–1.2)
Bilirubin, Direct: 0.34 mg/dL (ref 0.00–0.40)
TOTAL PROTEIN: 6.5 g/dL (ref 6.0–8.5)

## 2016-10-03 ENCOUNTER — Ambulatory Visit (INDEPENDENT_AMBULATORY_CARE_PROVIDER_SITE_OTHER): Payer: Medicare Other | Admitting: Family Medicine

## 2016-10-03 ENCOUNTER — Encounter: Payer: Self-pay | Admitting: Family Medicine

## 2016-10-03 VITALS — BP 104/65 | HR 64 | Temp 98.0°F | Ht 69.0 in | Wt 170.0 lb

## 2016-10-03 DIAGNOSIS — S30861A Insect bite (nonvenomous) of abdominal wall, initial encounter: Secondary | ICD-10-CM

## 2016-10-03 DIAGNOSIS — I6529 Occlusion and stenosis of unspecified carotid artery: Secondary | ICD-10-CM

## 2016-10-03 DIAGNOSIS — W57XXXA Bitten or stung by nonvenomous insect and other nonvenomous arthropods, initial encounter: Secondary | ICD-10-CM

## 2016-10-03 MED ORDER — DOXYCYCLINE HYCLATE 100 MG PO TABS: 100.0000 mg | ORAL_TABLET | Freq: Two times a day (BID) | ORAL | 0 refills | Status: DC

## 2016-10-03 NOTE — Progress Notes (Signed)
   BP 104/65   Pulse 64   Temp 98 F (36.7 C) (Oral)   Ht 5\' 9"  (1.753 m)   Wt 170 lb (77.1 kg)   BMI 25.10 kg/m    Subjective:    Patient ID: Cameron Moh., male    DOB: 18-Dec-1932, 83 y.o.   MRN: [id number]  HPI: Cameron Woodrick. is a 83 y.o. male presenting on 10/03/2016 for Tick Removal (5-6 days ago, on abdomen, never saw tick)   HPI Tick bite Patient comes in complaining of spoton his abdomen about 5-6 days ago that looked like it was a tick bite, he never had a tick there that he noticed but just the spot looked like it could've been a tick bite and so is coming in today. He says the rash is resolved. He does not have any rash anywhere else and denies any arthralgias or fevers or chills.  Relevant past medical, surgical, family and social history reviewed and updated as indicated. Interim medical history since our last visit reviewed. Allergies and medications reviewed and updated.  Review of Systems  Constitutional: Negative for chills and fever.  Respiratory: Negative for shortness of breath and wheezing.   Cardiovascular: Negative for chest pain and leg swelling.  Musculoskeletal: Negative for back pain and gait problem.  Skin: Negative for color change and rash.  All other systems reviewed and are negative.   Per HPI unless specifically indicated above     Objective:    BP 104/65   Pulse 64   Temp 98 F (36.7 C) (Oral)   Ht 5\' 9"  (1.753 m)   Wt 170 lb (77.1 kg)   BMI 25.10 kg/m   Wt Readings from Last 3 Encounters:  10/03/16 170 lb (77.1 kg)  08/02/16 169 lb (76.7 kg)  04/07/16 171 lb (77.6 kg)    Physical Exam  Constitutional: He is oriented to person, place, and time. He appears well-developed and well-nourished. No distress.  Eyes: Conjunctivae are normal. No scleral icterus.  Cardiovascular: Normal rate, regular rhythm, normal heart sounds and intact distal pulses.   No murmur heard. Pulmonary/Chest: Effort normal and breath sounds normal. No  respiratory distress. He has no wheezes.  Musculoskeletal: Normal range of motion. He exhibits no edema.  Neurological: He is alert and oriented to person, place, and time. Coordination normal.  Skin: Skin is warm and dry. No lesion (No lesion or spotter eschar is noted in the spot where he says it was.) and no rash noted. He is not diaphoretic. No erythema.  Psychiatric: He has a normal mood and affect. His behavior is normal.  Nursing note and vitals reviewed.     Assessment & Plan:   Problem List Items Addressed This Visit    None    Visit Diagnoses    Tick bite, initial encounter    -  Primary   Patient's abdomen, never noticed a tick, will do 1day doxycycline   Relevant Medications   doxycycline (VIBRA-TABS) 100 MG tablet       Follow up plan: Return if symptoms worsen or fail to improve.  Counseling provided for all of the vaccine components No orders of the defined types were placed in this encounter.   Caryl Pina, MD [facility] 10/03/2016, 10:42 AM

## 2016-10-10 ENCOUNTER — Telehealth: Payer: Self-pay | Admitting: Family Medicine

## 2016-10-10 DIAGNOSIS — W57XXXA Bitten or stung by nonvenomous insect and other nonvenomous arthropods, initial encounter: Secondary | ICD-10-CM

## 2016-10-10 MED ORDER — DOXYCYCLINE HYCLATE 100 MG PO TABS: 100.0000 mg | ORAL_TABLET | Freq: Two times a day (BID) | ORAL | 0 refills | Status: DC

## 2016-10-10 NOTE — Telephone Encounter (Signed)
What symptoms do you have? Swollen neck on one side had a tick bite a couple of weeks ago  How long have you been sick? Noticed the swelling about three or 4 weeks ago   Have you been seen for this problem? Yes by Dr. Warrick Parisian Wants appt with Dr.Moore If your provider decides to give you a prescription, which pharmacy would you like for it to be sent to? [facility] [address]  Patient informed that this information will be sent to the clinical staff for review and that they should receive a follow up call.

## 2016-10-10 NOTE — Telephone Encounter (Signed)
#   4 tabs were sent ain at the last visit = do you want to extend the dose? Should he come back in ?

## 2016-10-10 NOTE — Telephone Encounter (Signed)
Please advise 

## 2016-10-10 NOTE — Telephone Encounter (Signed)
Give 3 weeks worth of doxycycline 100 mg twice daily with food

## 2016-10-10 NOTE — Telephone Encounter (Signed)
Pt aware by detailed VM - that med is at the [facility]

## 2016-10-11 ENCOUNTER — Ambulatory Visit (INDEPENDENT_AMBULATORY_CARE_PROVIDER_SITE_OTHER): Payer: Medicare Other | Admitting: Family Medicine

## 2016-10-11 ENCOUNTER — Encounter: Payer: Self-pay | Admitting: Family Medicine

## 2016-10-11 VITALS — BP 108/64 | HR 62 | Temp 98.5°F | Ht 69.0 in | Wt 169.0 lb

## 2016-10-11 DIAGNOSIS — M4722 Other spondylosis with radiculopathy, cervical region: Secondary | ICD-10-CM

## 2016-10-11 DIAGNOSIS — E785 Hyperlipidemia, unspecified: Secondary | ICD-10-CM

## 2016-10-11 DIAGNOSIS — M542 Cervicalgia: Secondary | ICD-10-CM | POA: Diagnosis not present

## 2016-10-11 DIAGNOSIS — I6529 Occlusion and stenosis of unspecified carotid artery: Secondary | ICD-10-CM

## 2016-10-11 NOTE — Progress Notes (Signed)
Subjective:    Patient ID: Cameron Moh., male    DOB: 1932/05/07, 83 y.o.   MRN: [id number]  HPI Patient here today for on-going neck pain.The patient has had ongoing problems with degenerative changes in the cervical spine with severe disc space narrowing and bulging disc. At the C6 and C7 level there is some spinal stenosis as well as foraminal. 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 This all show showed aortic atherosclerosis. He has seen neurosurgery. Over the past 3-4 months, the patient has been having more trouble with his neck and some swelling. He doesn't have any pain radiating down the left arm but he does have some radiating up the left side of the neck. He still plays golf and has no trouble doing this. He tries to be careful so as to prevent any falls. He denies chest pain shortness of breath trouble swallowing heartburn indigestion nausea vomiting diarrhea or blood in the stool. He's passing his water without problems.    Patient Active Problem List   Diagnosis Date Noted  . History of bilateral inguinal hernias 09/01/2015  . Arthralgia of hands, bilateral 02/13/2014  . Benign prostatic hypertrophy 05/23/2013  . Vitamin D deficiency 04/11/2013  . Postoperative anemia due to acute blood loss 05/18/2012  . Gilberts syndrome   . Inguinal hernia recurrent unilateral   . Elevated PSA   . Erectile dysfunction   . Hyperlipidemia   . Osteoarthrosis and allied disorders    Outpatient Encounter Prescriptions as of 10/11/2016  Medication Sig  . aspirin EC 81 MG tablet Take 81 mg by mouth every evening.  . cholecalciferol (VITAMIN D) 1000 UNITS tablet Take 2,000 Units by mouth daily.   . Coenzyme Q10 200 MG capsule Take 200 mg by mouth every other day.  Marland Kitchen doxycycline (VIBRA-TABS) 100 MG tablet Take 1 tablet (100 mg total) by mouth 2 (two) times daily. 1 po bid  .  EPINEPHrine 0.3 mg/0.3 mL IJ SOAJ injection Inject 0.3 mLs (0.3 mg total) into the muscle once. (Patient taking differently: Inject 0.3 mg into the muscle daily as needed (for allergic reaction.). )  . Flaxseed, Linseed, (FLAX SEED OIL) 1000 MG CAPS Take 1,000 mg by mouth every evening.   . Glucosamine-Chondroitin-Vit D3 1500-1200-800 MG-MG-UNIT PACK Take 1 tablet by mouth every evening.  Marland Kitchen ibuprofen (ADVIL,MOTRIN) 200 MG tablet Take 200-400 mg by mouth daily as needed (for pain).   . naproxen sodium (ANAPROX) 220 MG tablet Take 220-440 mg by mouth daily as needed (for pain.).  Marland Kitchen rosuvastatin (CRESTOR) 5 MG tablet Take 1 tablet (5 mg total) by mouth at bedtime.   No facility-administered encounter medications on file as of 10/11/2016.       Review of Systems  Constitutional: Negative.   HENT: Negative.   Eyes: Negative.   Respiratory: Negative.   Cardiovascular: Negative.   Gastrointestinal: Negative.   Endocrine: Negative.   Genitourinary: Negative.   Musculoskeletal: Positive for neck pain.  Skin: Negative.   Allergic/Immunologic: Negative.   Neurological: Negative.   Hematological: Negative.   Psychiatric/Behavioral: Negative.        Objective:   Physical Exam  Constitutional: He is oriented to person, place, and time. He appears well-developed and well-nourished. No distress.  HENT:  Head: Normocephalic and atraumatic.  Eyes: Conjunctivae and EOM are normal. Pupils are equal, round, and reactive to light. Right eye exhibits no  discharge. Left eye exhibits no discharge. No scleral icterus.  Neck: No thyromegaly present.  Decreased range of motion and some slight muscle tightening on the left posterior muscle area of the neck.  Cardiovascular: Normal rate, regular rhythm and intact distal pulses.   No murmur heard. Pulmonary/Chest: Effort normal and breath sounds normal. No respiratory distress. He has no wheezes. He has no rales.  Some basilar congestion bilaterally and  recent chest x-ray review was clear.  Abdominal: Soft. Bowel sounds are normal. He exhibits distension. He exhibits no mass. There is no tenderness. There is no rebound and no guarding.  Some gas and distention but no tenderness and no masses and no organ enlargement  Musculoskeletal: Normal range of motion. He exhibits no edema.  Decreased range of motion of cervical spine  Neurological: He is alert and oriented to person, place, and time. He has normal reflexes. No cranial nerve deficit.  Skin: Skin is warm and dry. No rash noted. No erythema.  Psychiatric: He has a normal mood and affect. His behavior is normal. Judgment and thought content normal.  Nursing note and vitals reviewed.  BP (!) 89/50 (BP Location: Left Arm)   Pulse 62   Temp 98.5 F (36.9 C) (Oral)   Ht 5\' 9"  (1.753 m)   Wt 169 lb (76.7 kg)   BMI 24.96 kg/m         Assessment & Plan:  1. Carotid atherosclerosis, unspecified laterality -This was reviewed with the patient he was given a copy of the report. There was less than 50% stenosis bilaterally.  2. Hyperlipidemia, unspecified hyperlipidemia type -The patient will continue with his current treatment regimen and as aggressive therapeutic lifestyle changes as possible  3. Neck pain on left side -Because of increasing problems with the neck and the known underlying problems that are there he will call and schedule to see the neurosurgeon for possible surgery at the surgeon's convenience.  4. Osteoarthritis of spine with radiculopathy, cervical region -All of the x-rays and MRIs and CT scans were reviewed with patient he was given copy of these reports. He is also given a copy of chest x-ray reportedly shows some arthritic changes in his thoracic spine.  Patient Instructions  Continue to be careful and do not put yourself at risk for falling Avoid heavy lifting Call to set up an appointment with the neurosurgeon to go ahead and get this next surgery behind you  because of the problems that have been increasing over the past 4 months or so.  Arrie Senate MD

## 2016-10-11 NOTE — Patient Instructions (Signed)
Continue to be careful and do not put yourself at risk for falling Avoid heavy lifting Call to set up an appointment with the neurosurgeon to go ahead and get this next surgery behind you because of the problems that have been increasing over the past 4 months or so.

## 2016-10-25 DIAGNOSIS — R030 Elevated blood-pressure reading, without diagnosis of hypertension: Secondary | ICD-10-CM | POA: Diagnosis not present

## 2016-10-25 DIAGNOSIS — M532X1 Spinal instabilities, occipito-atlanto-axial region: Secondary | ICD-10-CM | POA: Diagnosis not present

## 2016-10-28 ENCOUNTER — Other Ambulatory Visit: Payer: Self-pay | Admitting: Neurosurgery

## 2016-11-15 DIAGNOSIS — N400 Enlarged prostate without lower urinary tract symptoms: Secondary | ICD-10-CM | POA: Diagnosis not present

## 2016-11-16 NOTE — Pre-Procedure Instructions (Signed)
Efren Kross Chain Jr.  11/16/2016      [facility] # [place], [place] [address] [place] [address] Phone: [phone] Fax: [phone]    Your procedure is scheduled on August 8  Report to [facility] at [place].M.  Call this number if you have problems the morning of surgery:  [phone]   Remember:  Do not eat food or drink liquids after midnight.   Take these medicines the morning of surgery with A SIP OF WATER NONE  7 days prior to surgery STOP taking any Aleve, Naproxen, Ibuprofen, Motrin, Advil, Goody's, BC's, all herbal medications, fish oil, and all vitamins   Follow your doctors instructions regarding your Aspirin.  If no instructions were given by the doctor you will need to call the office to get instructions.  Your pre admission RN will also call for those instructions    Do not wear jewelry  Do not wear lotions, powders, or cologne, or deoderant.  Men may shave face and neck.  Do not bring valuables to the hospital.  [facility] is not responsible for any belongings or valuables.  Contacts, dentures or bridgework may not be worn into surgery.  Leave your suitcase in the car.  After surgery it may be brought to your room.  For patients admitted to the hospital, discharge time will be determined by your treatment team.  Patients discharged the day of surgery will not be allowed to drive home.    Special instructions:   - Preparing For Surgery  Before surgery, you can play an important role. Because skin is not sterile, your skin needs to be as free of germs as possible. You can reduce the number of germs on your skin by washing with CHG (chlorahexidine gluconate) Soap before surgery.  CHG is an antiseptic cleaner which kills germs and bonds with the skin to continue killing germs even after washing.  Please do not use if you have an allergy to CHG or antibacterial soaps. If your  skin becomes reddened/irritated stop using the CHG.  Do not shave (including legs and underarms) for at least 48 hours prior to first CHG shower. It is OK to shave your face.  Please follow these instructions carefully.   1. Shower the NIGHT BEFORE SURGERY and the MORNING OF SURGERY with CHG.   2. If you chose to wash your hair, wash your hair first as usual with your normal shampoo.  3. After you shampoo, rinse your hair and body thoroughly to remove the shampoo.  4. Use CHG as you would any other liquid soap. You can apply CHG directly to the skin and wash gently with a scrungie or a clean washcloth.   5. Apply the CHG Soap to your body ONLY FROM THE NECK DOWN.  Do not use on open wounds or open sores. Avoid contact with your eyes, ears, mouth and genitals (private parts). Wash genitals (private parts) with your normal soap.  6. Wash thoroughly, paying special attention to the area where your surgery will be performed.  7. Thoroughly rinse your body with warm water from the neck down.  8. DO NOT shower/wash with your normal soap after using and rinsing off the CHG Soap.  9. Pat yourself dry with a CLEAN TOWEL.   10. Wear CLEAN PAJAMAS   11. Place CLEAN SHEETS on your bed the night of your first shower and DO NOT SLEEP WITH  PETS.    Day of Surgery: Do not apply any deodorants/lotions. Please wear clean clothes to the hospital/surgery center.      Please read over the following fact sheets that you were given.

## 2016-11-17 ENCOUNTER — Encounter (HOSPITAL_COMMUNITY)
Admission: RE | Admit: 2016-11-17 | Discharge: 2016-11-17 | Disposition: A | Discharge status: Home / Self Care | Payer: Medicare Other | Source: Ambulatory Visit | Attending: Neurosurgery | Admitting: Neurosurgery

## 2016-11-17 ENCOUNTER — Encounter (HOSPITAL_COMMUNITY): Payer: Self-pay

## 2016-11-17 DIAGNOSIS — Z01812 Encounter for preprocedural laboratory examination: Secondary | ICD-10-CM | POA: Diagnosis not present

## 2016-11-17 DIAGNOSIS — E785 Hyperlipidemia, unspecified: Secondary | ICD-10-CM | POA: Diagnosis not present

## 2016-11-17 DIAGNOSIS — Z0183 Encounter for blood typing: Secondary | ICD-10-CM | POA: Insufficient documentation

## 2016-11-17 DIAGNOSIS — R9431 Abnormal electrocardiogram [ECG] [EKG]: Secondary | ICD-10-CM | POA: Insufficient documentation

## 2016-11-17 DIAGNOSIS — Z01818 Encounter for other preprocedural examination: Secondary | ICD-10-CM | POA: Insufficient documentation

## 2016-11-17 LAB — CBC
HCT: 48.1 % (ref 39.0–52.0)
HEMOGLOBIN: 15.6 g/dL (ref 13.0–17.0)
MCH: 29.8 pg (ref 26.0–34.0)
MCHC: 32.4 g/dL (ref 30.0–36.0)
MCV: 92 fL (ref 78.0–100.0)
PLATELETS: 194 10*3/uL (ref 150–400)
RBC: 5.23 MIL/uL (ref 4.22–5.81)
RDW: 13.5 % (ref 11.5–15.5)
WBC: 7.1 10*3/uL (ref 4.0–10.5)

## 2016-11-17 LAB — SURGICAL PCR SCREEN
MRSA, PCR: NEGATIVE
STAPHYLOCOCCUS AUREUS: NEGATIVE

## 2016-11-17 LAB — BASIC METABOLIC PANEL
Anion gap: 9 (ref 5–15)
BUN: 19 mg/dL (ref 6–20)
CHLORIDE: 102 mmol/L (ref 101–111)
CO2: 28 mmol/L (ref 22–32)
Calcium: 9.7 mg/dL (ref 8.9–10.3)
Creatinine, Ser: 0.88 mg/dL (ref 0.61–1.24)
GFR calc non Af Amer: >60 mL/min (ref >60)
Glucose, Bld: 84 mg/dL (ref 65–99)
POTASSIUM: 4.3 mmol/L (ref 3.5–5.1)
SODIUM: 139 mmol/L (ref 135–145)

## 2016-11-17 LAB — TYPE AND SCREEN
ABO/RH(D): O POS
ANTIBODY SCREEN: NEGATIVE

## 2016-11-17 LAB — ABO/RH: ABO/RH(D): O POS

## 2016-11-17 NOTE — Progress Notes (Signed)
PCP - Dr. Redge Gainer Cardiologist - Patient denies  Chest x-ray - 08/02/2016 EKG - 11/17/2016, pending Stress Test - patient unsure ECHO - patient denies Cardiac Cath - patient denies  Sleep Study - patient denies (negative stop bang)    Patient denies shortness of breath, fever, cough and chest pain at PAT appointment   Patient verbalized understanding of instructions that were given to them at the PAT appointment. Patient was also instructed that they will need to review over the PAT instructions again at home before surgery.  Patient stated his instructions from Dr. Arnoldo Morale' office were to stop 81mg  ASA 7 days prior to surgery.

## 2016-11-29 NOTE — Anesthesia Preprocedure Evaluation (Addendum)
Anesthesia Evaluation  Patient identified by MRN, date of birth, ID band Patient awake    Reviewed: Allergy & Precautions, H&P , NPO status , Patient's Chart, lab work & pertinent test results  Airway Mallampati: II  TM Distance: >3 FB Neck ROM: Limited    Dental no notable dental hx. (+) Poor Dentition   Pulmonary neg pulmonary ROS, former smoker,    Pulmonary exam normal breath sounds clear to auscultation       Cardiovascular negative cardio ROS Normal cardiovascular exam Rhythm:Regular Rate:Normal     Neuro/Psych PSYCHIATRIC DISORDERS Anxiety  Neuromuscular disease negative neurological ROS  negative psych ROS   GI/Hepatic negative GI ROS, Neg liver ROS,   Endo/Other  negative endocrine ROS  Renal/GU negative Renal ROS  negative genitourinary   Musculoskeletal negative musculoskeletal ROS (+) Arthritis , Osteoarthritis,    Abdominal   Peds negative pediatric ROS (+)  Hematology negative hematology ROS (+) anemia ,   Anesthesia Other Findings   Reproductive/Obstetrics negative OB ROS                            Anesthesia Physical  Anesthesia Plan  ASA: II  Anesthesia Plan: General   Post-op Pain Management:    Induction: Intravenous  PONV Risk Score and Plan: 2 and Ondansetron, Dexamethasone, Treatment may vary due to age or medical condition and Midazolam  Airway Management Planned: Oral ETT  Additional Equipment:   Intra-op Plan:   Post-operative Plan: Extubation in OR  Informed Consent: I have reviewed the patients History and Physical, chart, labs and discussed the procedure including the risks, benefits and alternatives for the proposed anesthesia with the patient or authorized representative who has indicated his/her understanding and acceptance.   Dental advisory given  Plan Discussed with: CRNA  Anesthesia Plan Comments:         Anesthesia Quick  Evaluation

## 2016-11-30 ENCOUNTER — Inpatient Hospital Stay (HOSPITAL_COMMUNITY): Payer: Medicare Other | Admitting: Certified Registered Nurse Anesthetist

## 2016-11-30 ENCOUNTER — Encounter (HOSPITAL_COMMUNITY)
Admission: RE | Disposition: A | Discharge status: Home / Self Care | Payer: Self-pay | Source: Ambulatory Visit | Attending: Neurosurgery

## 2016-11-30 ENCOUNTER — Inpatient Hospital Stay (HOSPITAL_COMMUNITY): Payer: Medicare Other

## 2016-11-30 ENCOUNTER — Inpatient Hospital Stay (HOSPITAL_COMMUNITY)
Admission: RE | Admit: 2016-11-30 | Discharge: 2016-12-01 | DRG: 473 | Disposition: A | Discharge status: Home / Self Care | Payer: Medicare Other | Source: Ambulatory Visit | Attending: Neurosurgery | Admitting: Neurosurgery

## 2016-11-30 DIAGNOSIS — Z973 Presence of spectacles and contact lenses: Secondary | ICD-10-CM

## 2016-11-30 DIAGNOSIS — D649 Anemia, unspecified: Secondary | ICD-10-CM | POA: Diagnosis present

## 2016-11-30 DIAGNOSIS — Z791 Long term (current) use of non-steroidal anti-inflammatories (NSAID): Secondary | ICD-10-CM | POA: Diagnosis not present

## 2016-11-30 DIAGNOSIS — M1990 Unspecified osteoarthritis, unspecified site: Secondary | ICD-10-CM | POA: Diagnosis present

## 2016-11-30 DIAGNOSIS — N400 Enlarged prostate without lower urinary tract symptoms: Secondary | ICD-10-CM | POA: Diagnosis present

## 2016-11-30 DIAGNOSIS — Z96652 Presence of left artificial knee joint: Secondary | ICD-10-CM | POA: Diagnosis present

## 2016-11-30 DIAGNOSIS — Z825 Family history of asthma and other chronic lower respiratory diseases: Secondary | ICD-10-CM

## 2016-11-30 DIAGNOSIS — Z8249 Family history of ischemic heart disease and other diseases of the circulatory system: Secondary | ICD-10-CM | POA: Diagnosis not present

## 2016-11-30 DIAGNOSIS — Z87891 Personal history of nicotine dependence: Secondary | ICD-10-CM | POA: Diagnosis not present

## 2016-11-30 DIAGNOSIS — E785 Hyperlipidemia, unspecified: Secondary | ICD-10-CM | POA: Diagnosis present

## 2016-11-30 DIAGNOSIS — Z79899 Other long term (current) drug therapy: Secondary | ICD-10-CM | POA: Diagnosis not present

## 2016-11-30 DIAGNOSIS — N529 Male erectile dysfunction, unspecified: Secondary | ICD-10-CM | POA: Diagnosis present

## 2016-11-30 DIAGNOSIS — M532X1 Spinal instabilities, occipito-atlanto-axial region: Secondary | ICD-10-CM | POA: Diagnosis present

## 2016-11-30 DIAGNOSIS — Z91030 Bee allergy status: Secondary | ICD-10-CM | POA: Diagnosis not present

## 2016-11-30 DIAGNOSIS — E7800 Pure hypercholesterolemia, unspecified: Secondary | ICD-10-CM | POA: Diagnosis present

## 2016-11-30 DIAGNOSIS — M532X2 Spinal instabilities, cervical region: Secondary | ICD-10-CM | POA: Diagnosis present

## 2016-11-30 DIAGNOSIS — K4091 Unilateral inguinal hernia, without obstruction or gangrene, recurrent: Secondary | ICD-10-CM | POA: Diagnosis not present

## 2016-11-30 DIAGNOSIS — Z419 Encounter for procedure for purposes other than remedying health state, unspecified: Secondary | ICD-10-CM

## 2016-11-30 DIAGNOSIS — Z7982 Long term (current) use of aspirin: Secondary | ICD-10-CM | POA: Diagnosis not present

## 2016-11-30 DIAGNOSIS — M4322 Fusion of spine, cervical region: Secondary | ICD-10-CM | POA: Diagnosis not present

## 2016-11-30 HISTORY — PX: POSTERIOR CERVICAL FUSION/FORAMINOTOMY: SHX5038

## 2016-11-30 SURGERY — POSTERIOR CERVICAL FUSION/FORAMINOTOMY LEVEL 1
Anesthesia: General | Site: Spine Cervical

## 2016-11-30 MED ORDER — VITAMIN D 1000 UNITS PO TABS
2000.0000 [IU] | ORAL_TABLET | Freq: Every day | ORAL | Status: DC
  Administered 2016-11-30 – 2016-12-01 (×2): 2000 [IU] via ORAL
  Filled 2016-11-30 (×2): qty 2

## 2016-11-30 MED ORDER — BACITRACIN ZINC 500 UNIT/GM EX OINT
TOPICAL_OINTMENT | CUTANEOUS | Status: DC | PRN
  Administered 2016-11-30: 1 via TOPICAL

## 2016-11-30 MED ORDER — OXYCODONE HCL 5 MG PO TABS
5.0000 mg | ORAL_TABLET | ORAL | Status: DC | PRN
  Administered 2016-11-30 (×3): 10 mg via ORAL
  Filled 2016-11-30 (×2): qty 2

## 2016-11-30 MED ORDER — PROPOFOL 10 MG/ML IV BOLUS
INTRAVENOUS | Status: AC
  Filled 2016-11-30: qty 20

## 2016-11-30 MED ORDER — BUPIVACAINE-EPINEPHRINE 0.5% -1:200000 IJ SOLN
INTRAMUSCULAR | Status: DC | PRN
  Administered 2016-11-30: 10 mL

## 2016-11-30 MED ORDER — SODIUM CHLORIDE 0.9 % IR SOLN
Status: DC | PRN
  Administered 2016-11-30: 500 mL

## 2016-11-30 MED ORDER — PHENYLEPHRINE 40 MCG/ML (10ML) SYRINGE FOR IV PUSH (FOR BLOOD PRESSURE SUPPORT)
PREFILLED_SYRINGE | INTRAVENOUS | Status: DC | PRN
  Administered 2016-11-30: 40 ug via INTRAVENOUS

## 2016-11-30 MED ORDER — ACETAMINOPHEN 325 MG PO TABS
650.0000 mg | ORAL_TABLET | ORAL | Status: DC | PRN
  Administered 2016-11-30 – 2016-12-01 (×3): 650 mg via ORAL
  Filled 2016-11-30 (×3): qty 2

## 2016-11-30 MED ORDER — MORPHINE SULFATE (PF) 4 MG/ML IV SOLN: 4.0000 mg | INTRAVENOUS | Status: DC | PRN

## 2016-11-30 MED ORDER — CEFAZOLIN SODIUM-DEXTROSE 2-4 GM/100ML-% IV SOLN
2.0000 g | Freq: Three times a day (TID) | INTRAVENOUS | Status: AC
  Administered 2016-11-30 (×2): 2 g via INTRAVENOUS
  Filled 2016-11-30 (×2): qty 100

## 2016-11-30 MED ORDER — CHLORHEXIDINE GLUCONATE CLOTH 2 % EX PADS: 6.0000 | MEDICATED_PAD | Freq: Once | CUTANEOUS | Status: DC

## 2016-11-30 MED ORDER — FENTANYL CITRATE (PF) 100 MCG/2ML IJ SOLN
INTRAMUSCULAR | Status: AC
  Administered 2016-11-30: 25 ug via INTRAVENOUS
  Filled 2016-11-30: qty 2

## 2016-11-30 MED ORDER — LACTATED RINGERS IV SOLN
INTRAVENOUS | Status: DC | PRN
  Administered 2016-11-30: 08:00:00 via INTRAVENOUS

## 2016-11-30 MED ORDER — BUPIVACAINE LIPOSOME 1.3 % IJ SUSP
20.0000 mL | INTRAMUSCULAR | Status: AC
  Administered 2016-11-30: 20 mL
  Filled 2016-11-30: qty 20

## 2016-11-30 MED ORDER — PROPOFOL 10 MG/ML IV BOLUS
INTRAVENOUS | Status: AC
  Filled 2016-11-30: qty 40

## 2016-11-30 MED ORDER — OXYCODONE HCL 5 MG PO TABS
5.0000 mg | ORAL_TABLET | ORAL | Status: DC | PRN
  Administered 2016-12-01 (×2): 5 mg via ORAL
  Filled 2016-11-30 (×2): qty 1

## 2016-11-30 MED ORDER — PHENOL 1.4 % MT LIQD: 1.0000 | OROMUCOSAL | Status: DC | PRN

## 2016-11-30 MED ORDER — FENTANYL CITRATE (PF) 100 MCG/2ML IJ SOLN
25.0000 ug | INTRAMUSCULAR | Status: DC | PRN
  Administered 2016-11-30 (×2): 25 ug via INTRAVENOUS

## 2016-11-30 MED ORDER — MENTHOL 3 MG MT LOZG: 1.0000 | LOZENGE | OROMUCOSAL | Status: DC | PRN

## 2016-11-30 MED ORDER — ONDANSETRON HCL 4 MG/2ML IJ SOLN
INTRAMUSCULAR | Status: DC | PRN
  Administered 2016-11-30: 4 mg via INTRAVENOUS

## 2016-11-30 MED ORDER — COENZYME Q10 200 MG PO CAPS: 200.0000 mg | ORAL_CAPSULE | ORAL | Status: DC

## 2016-11-30 MED ORDER — OXYCODONE HCL 5 MG PO TABS
ORAL_TABLET | ORAL | Status: AC
  Administered 2016-11-30: 10 mg via ORAL
  Filled 2016-11-30: qty 2

## 2016-11-30 MED ORDER — THROMBIN 5000 UNITS EX SOLR
CUTANEOUS | Status: AC
  Filled 2016-11-30: qty 5000

## 2016-11-30 MED ORDER — SUGAMMADEX SODIUM 200 MG/2ML IV SOLN
INTRAVENOUS | Status: DC | PRN
  Administered 2016-11-30: 200 mg via INTRAVENOUS

## 2016-11-30 MED ORDER — FENTANYL CITRATE (PF) 100 MCG/2ML IJ SOLN
INTRAMUSCULAR | Status: DC | PRN
  Administered 2016-11-30: 25 ug via INTRAVENOUS
  Administered 2016-11-30: 100 ug via INTRAVENOUS
  Administered 2016-11-30: 50 ug via INTRAVENOUS
  Administered 2016-11-30: 25 ug via INTRAVENOUS

## 2016-11-30 MED ORDER — LIDOCAINE 2% (20 MG/ML) 5 ML SYRINGE
INTRAMUSCULAR | Status: DC | PRN
  Administered 2016-11-30: 100 mg via INTRAVENOUS

## 2016-11-30 MED ORDER — CEFAZOLIN SODIUM-DEXTROSE 2-4 GM/100ML-% IV SOLN
2.0000 g | INTRAVENOUS | Status: AC
  Administered 2016-11-30: 2 g via INTRAVENOUS
  Filled 2016-11-30: qty 100

## 2016-11-30 MED ORDER — PHENYLEPHRINE HCL 10 MG/ML IJ SOLN
INTRAMUSCULAR | Status: DC | PRN
  Administered 2016-11-30: 10 ug/min via INTRAVENOUS

## 2016-11-30 MED ORDER — MIDAZOLAM HCL 2 MG/2ML IJ SOLN
INTRAMUSCULAR | Status: AC
  Filled 2016-11-30: qty 2

## 2016-11-30 MED ORDER — FENTANYL CITRATE (PF) 250 MCG/5ML IJ SOLN
INTRAMUSCULAR | Status: AC
  Filled 2016-11-30: qty 5

## 2016-11-30 MED ORDER — LACTATED RINGERS IV SOLN: INTRAVENOUS | Status: DC

## 2016-11-30 MED ORDER — DOCUSATE SODIUM 100 MG PO CAPS
100.0000 mg | ORAL_CAPSULE | Freq: Two times a day (BID) | ORAL | Status: DC
  Administered 2016-11-30 – 2016-12-01 (×3): 100 mg via ORAL
  Filled 2016-11-30 (×3): qty 1

## 2016-11-30 MED ORDER — BUPIVACAINE-EPINEPHRINE (PF) 0.5% -1:200000 IJ SOLN
INTRAMUSCULAR | Status: AC
  Filled 2016-11-30: qty 30

## 2016-11-30 MED ORDER — HEMOSTATIC AGENTS (NO CHARGE) OPTIME
TOPICAL | Status: DC | PRN
  Administered 2016-11-30: 1 via TOPICAL

## 2016-11-30 MED ORDER — PROPOFOL 10 MG/ML IV BOLUS
INTRAVENOUS | Status: DC | PRN
  Administered 2016-11-30: 20 mg via INTRAVENOUS
  Administered 2016-11-30: 160 mg via INTRAVENOUS
  Administered 2016-11-30: 40 mg via INTRAVENOUS
  Administered 2016-11-30 (×2): 20 mg via INTRAVENOUS

## 2016-11-30 MED ORDER — ONDANSETRON HCL 4 MG/2ML IJ SOLN
INTRAMUSCULAR | Status: AC
  Filled 2016-11-30: qty 2

## 2016-11-30 MED ORDER — ONDANSETRON HCL 4 MG/2ML IJ SOLN
4.0000 mg | Freq: Once | INTRAMUSCULAR | Status: AC
  Administered 2016-11-30: 4 mg via INTRAVENOUS

## 2016-11-30 MED ORDER — ALUM & MAG HYDROXIDE-SIMETH 200-200-20 MG/5ML PO SUSP: 30.0000 mL | Freq: Four times a day (QID) | ORAL | Status: DC | PRN

## 2016-11-30 MED ORDER — ONDANSETRON HCL 4 MG PO TABS
4.0000 mg | ORAL_TABLET | Freq: Four times a day (QID) | ORAL | Status: DC | PRN
  Administered 2016-11-30 – 2016-12-01 (×2): 4 mg via ORAL
  Filled 2016-11-30 (×2): qty 1

## 2016-11-30 MED ORDER — ROCURONIUM BROMIDE 10 MG/ML (PF) SYRINGE
PREFILLED_SYRINGE | INTRAVENOUS | Status: DC | PRN
  Administered 2016-11-30 (×5): 10 mg via INTRAVENOUS
  Administered 2016-11-30: 50 mg via INTRAVENOUS

## 2016-11-30 MED ORDER — BACITRACIN ZINC 500 UNIT/GM EX OINT
TOPICAL_OINTMENT | CUTANEOUS | Status: AC
  Filled 2016-11-30: qty 28.35

## 2016-11-30 MED ORDER — BISACODYL 10 MG RE SUPP: 10.0000 mg | Freq: Every day | RECTAL | Status: DC | PRN

## 2016-11-30 MED ORDER — THROMBIN 5000 UNITS EX SOLR
OROMUCOSAL | Status: DC | PRN
  Administered 2016-11-30: 5 mL via TOPICAL

## 2016-11-30 MED ORDER — ACETAMINOPHEN 650 MG RE SUPP: 650.0000 mg | RECTAL | Status: DC | PRN

## 2016-11-30 MED ORDER — ONDANSETRON HCL 4 MG/2ML IJ SOLN: 4.0000 mg | Freq: Four times a day (QID) | INTRAMUSCULAR | Status: DC | PRN

## 2016-11-30 MED ORDER — THROMBIN 5000 UNITS EX SOLR
CUTANEOUS | Status: DC | PRN
  Administered 2016-11-30 (×2): 5000 [IU] via TOPICAL

## 2016-11-30 MED ORDER — THROMBIN 5000 UNITS EX SOLR
CUTANEOUS | Status: AC
  Filled 2016-11-30: qty 10000

## 2016-11-30 MED ORDER — MEPERIDINE HCL 25 MG/ML IJ SOLN: 6.2500 mg | INTRAMUSCULAR | Status: DC | PRN

## 2016-11-30 MED ORDER — DEXAMETHASONE SODIUM PHOSPHATE 10 MG/ML IJ SOLN
INTRAMUSCULAR | Status: DC | PRN
  Administered 2016-11-30: 10 mg via INTRAVENOUS

## 2016-11-30 SURGICAL SUPPLY — 75 items
APL SKNCLS STERI-STRIP NONHPOA (GAUZE/BANDAGES/DRESSINGS) ×1
BAG DECANTER FOR FLEXI CONT (MISCELLANEOUS) ×2 IMPLANT
BENZOIN TINCTURE PRP APPL 2/3 (GAUZE/BANDAGES/DRESSINGS) ×1 IMPLANT
BIT DRILL NEURO 2X3.1 SFT TUCH (MISCELLANEOUS) ×1 IMPLANT
BLADE CLIPPER SURG (BLADE) ×1 IMPLANT
BLADE ULTRA TIP 2M (BLADE) IMPLANT
CABLE SNG STERILE W/CRIMP (MISCELLANEOUS) IMPLANT
CANISTER SUCT 3000ML PPV (MISCELLANEOUS) ×2 IMPLANT
CAP CLSR POST CERV (Cap) ×4 IMPLANT
CARTRIDGE OIL MAESTRO DRILL (MISCELLANEOUS) ×1 IMPLANT
CLSR STERI-STRIP ANTIMIC 1/2X4 (GAUZE/BANDAGES/DRESSINGS) ×1 IMPLANT
DECANTER SPIKE VIAL GLASS SM (MISCELLANEOUS) ×1 IMPLANT
DIFFUSER DRILL AIR PNEUMATIC (MISCELLANEOUS) ×2 IMPLANT
DRAPE C-ARM 42X72 X-RAY (DRAPES) ×5 IMPLANT
DRAPE LAPAROTOMY 100X72 PEDS (DRAPES) ×2 IMPLANT
DRAPE MICROSCOPE LEICA (MISCELLANEOUS) IMPLANT
DRAPE POUCH INSTRU U-SHP 10X18 (DRAPES) ×2 IMPLANT
DRILL NEURO 2X3.1 SOFT TOUCH (MISCELLANEOUS) ×2
ELECT REM PT RETURN 9FT ADLT (ELECTROSURGICAL) ×2
ELECTRODE REM PT RTRN 9FT ADLT (ELECTROSURGICAL) ×1 IMPLANT
GAUZE SPONGE 4X4 12PLY STRL (GAUZE/BANDAGES/DRESSINGS) IMPLANT
GAUZE SPONGE 4X4 12PLY STRL LF (GAUZE/BANDAGES/DRESSINGS) ×1 IMPLANT
GAUZE SPONGE 4X4 16PLY XRAY LF (GAUZE/BANDAGES/DRESSINGS) IMPLANT
GLOVE BIO SURGEON STRL SZ8 (GLOVE) ×4 IMPLANT
GLOVE BIO SURGEON STRL SZ8.5 (GLOVE) ×4 IMPLANT
GLOVE BIOGEL PI IND STRL 7.0 (GLOVE) IMPLANT
GLOVE BIOGEL PI IND STRL 8 (GLOVE) IMPLANT
GLOVE BIOGEL PI INDICATOR 7.0 (GLOVE) ×2
GLOVE BIOGEL PI INDICATOR 8 (GLOVE) ×1
GLOVE ECLIPSE 7.5 STRL STRAW (GLOVE) ×1 IMPLANT
GLOVE EXAM NITRILE LRG STRL (GLOVE) IMPLANT
GLOVE EXAM NITRILE XL STR (GLOVE) IMPLANT
GLOVE EXAM NITRILE XS STR PU (GLOVE) IMPLANT
GLOVE SURG SS PI 7.0 STRL IVOR (GLOVE) ×1 IMPLANT
GOWN STRL REUS W/ TWL LRG LVL3 (GOWN DISPOSABLE) IMPLANT
GOWN STRL REUS W/ TWL XL LVL3 (GOWN DISPOSABLE) ×1 IMPLANT
GOWN STRL REUS W/TWL 2XL LVL3 (GOWN DISPOSABLE) ×1 IMPLANT
GOWN STRL REUS W/TWL LRG LVL3 (GOWN DISPOSABLE) ×2
GOWN STRL REUS W/TWL XL LVL3 (GOWN DISPOSABLE) ×6
HEMOSTAT SURGICEL 2X14 (HEMOSTASIS) ×1 IMPLANT
KIT BASIN OR (CUSTOM PROCEDURE TRAY) ×3 IMPLANT
KIT INFUSE XX SMALL 0.7CC (Orthopedic Implant) ×1 IMPLANT
KIT ROOM TURNOVER OR (KITS) ×2 IMPLANT
NDL HYPO 21X1.5 SAFETY (NEEDLE) IMPLANT
NDL SPNL 18GX3.5 QUINCKE PK (NEEDLE) IMPLANT
NEEDLE HYPO 21X1.5 SAFETY (NEEDLE) ×2 IMPLANT
NEEDLE HYPO 22GX1.5 SAFETY (NEEDLE) ×2 IMPLANT
NEEDLE SPNL 18GX3.5 QUINCKE PK (NEEDLE) IMPLANT
NS IRRIG 1000ML POUR BTL (IV SOLUTION) ×2 IMPLANT
OIL CARTRIDGE MAESTRO DRILL (MISCELLANEOUS) ×2
PACK LAMINECTOMY NEURO (CUSTOM PROCEDURE TRAY) ×2 IMPLANT
PAD ARMBOARD 7.5X6 YLW CONV (MISCELLANEOUS) ×7 IMPLANT
PATTIES SURGICAL .25X.25 (GAUZE/BANDAGES/DRESSINGS) IMPLANT
PATTIES SURGICAL .5 X.5 (GAUZE/BANDAGES/DRESSINGS) ×1 IMPLANT
PIN MAYFIELD SKULL DISP (PIN) ×2 IMPLANT
PUTTY KINEX BIOACTIVE 5CC (Bone Implant) ×1 IMPLANT
ROD VIRAGE 3.5X35MM STRAIGHT (Cage) ×2 IMPLANT
RUBBERBAND STERILE (MISCELLANEOUS) IMPLANT
SCREW VIRAGE 3.5X26MM POLY (Cage) ×2 IMPLANT
SCREW VIRAGE 3.5X30 SM SHANK (Cage) ×2 IMPLANT
SPONGE LAP 4X18 X RAY DECT (DISPOSABLE) IMPLANT
SPONGE NEURO XRAY DETECT 1X3 (DISPOSABLE) IMPLANT
SPONGE SURGIFOAM ABS GEL SZ50 (HEMOSTASIS) ×1 IMPLANT
STAPLER SKIN PROX WIDE 3.9 (STAPLE) IMPLANT
STRIP CLOSURE SKIN 1/2X4 (GAUZE/BANDAGES/DRESSINGS) IMPLANT
SUT ETHILON 2 0 FS 18 (SUTURE) IMPLANT
SUT VIC AB 0 CT1 18XCR BRD8 (SUTURE) ×1 IMPLANT
SUT VIC AB 0 CT1 8-18 (SUTURE) ×4
SUT VIC AB 2-0 CP2 18 (SUTURE) ×2 IMPLANT
SYR 20CC LL (SYRINGE) ×1 IMPLANT
TAPE CLOTH SURG 4X10 WHT LF (GAUZE/BANDAGES/DRESSINGS) ×1 IMPLANT
TOWEL GREEN STERILE (TOWEL DISPOSABLE) ×2 IMPLANT
TOWEL GREEN STERILE FF (TOWEL DISPOSABLE) ×2 IMPLANT
TRAY FOLEY W/METER SILVER 16FR (SET/KITS/TRAYS/PACK) IMPLANT
WATER STERILE IRR 1000ML POUR (IV SOLUTION) ×2 IMPLANT

## 2016-11-30 NOTE — Progress Notes (Signed)
PHARMACIST - PHYSICIAN ORDER COMMUNICATION  CONCERNING: P&T Medication Policy on Herbal Medications  DESCRIPTION:  This patient's order for:  Coenzyme Q10  has been noted.  This product(s) is classified as an "herbal" or natural product. Due to a lack of definitive safety studies or FDA approval, nonstandard manufacturing practices, plus the potential risk of unknown drug-drug interactions while on inpatient medications, the Pharmacy and Therapeutics Committee does not permit the use of "herbal" or natural products of this type within [facility].   ACTION TAKEN: The pharmacy department is unable to verify this order at this time and your patient has been informed of this safety policy. Please reevaluate patient's clinical condition at discharge and address if the herbal or natural product(s) should be resumed at that time.   Vincenza Hews, PharmD, BCPS 11/30/2016, 1:06 PM

## 2016-11-30 NOTE — Progress Notes (Signed)
Patient ID: Cameron [name]., male   DOB: 1932-08-19, 83 y.o.   MRN: [id number] Subjective:  The patient is somnolent but arousable. He looks well.  Objective: Vital signs in last 24 hours: Temp:  [97.2 F (36.2 C)-97.7 F (36.5 C)] 97.2 F (36.2 C) (08/08 1127) Pulse Rate:  [64-84] 76 (08/08 1209) Resp:  [13-24] 19 (08/08 1209) BP: (151-165)/(84-98) 165/98 (08/08 1209) SpO2:  [94 %-100 %] 98 % (08/08 1209) Weight:  [77.8 kg (171 lb 8 oz)] 77.8 kg (171 lb 8 oz) (08/08 0656)  Intake/Output from previous day: No intake/output data recorded. Intake/Output this shift: Total I/O In: 1500 [I.V.:1500] Out: 125 [Blood:125]  Physical exam the patient is somnolent but arousable. He is moving his lower extremities well.  Lab Results: No results for input(s): WBC, HGB, HCT, PLT in the last 72 hours. BMET No results for input(s): NA, K, CL, CO2, GLUCOSE, BUN, CREATININE, CALCIUM in the last 72 hours.  Studies/Results: Dg Cervical Spine 2-3 Views  Result Date: 11/30/2016 CLINICAL DATA:  Posterior fusion at C1-2. EXAM: DG C-ARM 61-120 MIN; CERVICAL SPINE - 2-3 VIEW COMPARISON:  CT scan 10/15/2015 FINDINGS: Intraoperative fluoroscopic spot images demonstrate placement of bilateral screws at C1 and C2. These appear to be well position without complicating features. IMPRESSION: C1-2 fusion hardware in good position without complicating features. Electronically Signed   By: Marijo Sanes M.D.   On: 11/30/2016 11:06   Dg C-arm 61-120 Min  Result Date: 11/30/2016 CLINICAL DATA:  Posterior fusion at C1-2. EXAM: DG C-ARM 61-120 MIN; CERVICAL SPINE - 2-3 VIEW COMPARISON:  CT scan 10/15/2015 FINDINGS: Intraoperative fluoroscopic spot images demonstrate placement of bilateral screws at C1 and C2. These appear to be well position without complicating features. IMPRESSION: C1-2 fusion hardware in good position without complicating features. Electronically Signed   By: Marijo Sanes M.D.   On: 11/30/2016 11:06     Assessment/Plan: The patient is doing well. I have spoken to his wife.  LOS: 0 days     Ohm Dentler D 11/30/2016, 12:15 PM

## 2016-11-30 NOTE — Anesthesia Postprocedure Evaluation (Signed)
Anesthesia Post Note  Patient: Cameron [name].  Procedure(s) Performed: Procedure(s) (LRB): Cervical one-Cervical two Posterior Instrumentation and Fusion (N/A)     Patient location during evaluation: PACU Anesthesia Type: General Level of consciousness: awake and alert Pain management: pain level controlled Vital Signs Assessment: post-procedure vital signs reviewed and stable Respiratory status: spontaneous breathing, nonlabored ventilation, respiratory function stable and patient connected to nasal cannula oxygen Cardiovascular status: blood pressure returned to baseline and stable Postop Assessment: no signs of nausea or vomiting Anesthetic complications: no    Last Vitals:  Vitals:   11/30/16 1218 11/30/16 1224  BP: (!) 162/84 (!) 157/92  Pulse: 76 78  Resp: 11 14  Temp:      Last Pain:  Vitals:   11/30/16 1159  TempSrc:   PainSc: 5                  Shrey Boike

## 2016-11-30 NOTE — Op Note (Signed)
Brief history: The patient is an 83 year old white male who has complained of neck pain. He was worked up with cervical x-rays CT etc. which demonstrated the patient had atlantoaxial instability. I discussed the various treatment options with the patient. He has weighed the risks, benefits, and alternatives to surgery and decided to proceed with a posterior C1-2  instrumentation and fusion.  Preop diagnosis: Atlantoaxial instability  Postop diagnosis: The same  Procedure: C1-C2 posterior instrumentation; C1-C2 arthrodesis with local morselized autograft bone, bone morphogenic protein-soaked collagen sponges, and  Kinnex bone graft extender  Surgeon: Dr. Earle Gell  Assistant: Dr. Salem Senate  Anesthesia: Gen. endotracheal  Estimated blood loss: 125 mL  Specimens: None  Complications: None  Description of procedure: The patient was brought to the operating room by the anesthesia team. General endotracheal anesthesia was induced. I applied the [place] 3 point headrest to the patient's calvarium. He was carefully turned to the prone position on the chest rolls. The patient's suboccipital region was then shaved with clippers in this region as well as the posterior cervical and upper thoracic region was then prepared with Betadine scrub and Betadine solution. Sterile drapes were applied. I then injected the area to be incised with Marcaine with epinephrine solution. I used a scalpel to make a linear midline incision at the occipital cervical junction. I used electrocautery to perform a lateral subperiosteal dissection exposing the C1 and C2 lamina. I inserted the McCullough retractor for exposure.  I then carefully dissected through the epidural veins down to the C1/C2 joint. I identified the C2 pars. Under fluoroscopic guidance I drilled a 30 mm hole into the lateral masses bilaterally at C1 and a 26 mm hole laterally into the C2 pars. I then probed inside these holes to rule out cortical  breaches. There were none. I then inserted a 30 mm were partially threaded screw into the C1 lateral mass bilaterally and a 26 mm fully threaded screw bilaterally at C2 pars. We got good bony purchase. The screws looked good on both AP and lateral fluoroscopy. I connected the lateral screws with a rod which which we secured in place with the caps.  We now turned attention to the arthrodesis. I used the high-speed drill to partially decorticate the lateral C1 and C2 lamina. We then laid a combination of bone morphogenic protein-soaked collagen sponges, Kinnex bone graft extender and local autograft bone that we obtain during the drilling over these decorticated structures completing the posterior arthrodesis at C1-2.  We then obtained hemostasis using bipolar cautery. We removed the retractor. I then reapproximated the patient's cervical thoracic fascia with interrupted #1 Vicryl suture. I reapproximated the subcutaneous tissue with interrupted 2-0 Vicryl suture. I reapproximate the skin with Steri-Strips and benzoin. The wound was then coated with bacitracin ointment. Sterile dressings were applied. The drapes were removed. The patient was returned to the supine position. I then removed the [place] 3 point headrest from the patient's calvarium. By report all sponge instrument and needle counts were correct at the end this case.

## 2016-11-30 NOTE — Anesthesia Procedure Notes (Signed)
Procedure Name: Intubation Date/Time: 11/30/2016 9:13 AM Performed by: Merdis Delay Pre-anesthesia Checklist: Patient identified, Emergency Drugs available, Suction available, Patient being monitored and Timeout performed Patient Re-evaluated:Patient Re-evaluated prior to induction Oxygen Delivery Method: Circle system utilized Preoxygenation: Pre-oxygenation with 100% oxygen Induction Type: IV induction Ventilation: Mask ventilation without difficulty Laryngoscope Size: Glidescope and 4 Grade View: Grade I Tube type: Oral Tube size: 7.5 mm Number of attempts: 1 Airway Equipment and Method: Stylet and Video-laryngoscopy Placement Confirmation: ETT inserted through vocal cords under direct vision,  positive ETCO2 and breath sounds checked- equal and bilateral Secured at: 22 cm Tube secured with: Tape Dental Injury: Teeth and Oropharynx as per pre-operative assessment  Difficulty Due To: Difficulty was anticipated and Difficult Airway- due to reduced neck mobility

## 2016-11-30 NOTE — Transfer of Care (Signed)
Immediate Anesthesia Transfer of Care Note  Patient: Cameron [name].  Procedure(s) Performed: Procedure(s): Cervical one-Cervical two Posterior Instrumentation and Fusion (N/A)  Patient Location: PACU  Anesthesia Type:General  Level of Consciousness: drowsy  Airway & Oxygen Therapy: Patient Spontanous Breathing  Post-op Assessment: Report given to RN and Post -op Vital signs reviewed and stable  Post vital signs: Reviewed and stable  Last Vitals:  Vitals:   11/30/16 0656  BP: (!) 151/84  Pulse: 64  Resp: 20  Temp: 36.5 C    Last Pain:  Vitals:   11/30/16 0656  TempSrc: Oral  PainSc: 0-No pain      Patients Stated Pain Goal: 3 (40/37/54 3606)  Complications: No apparent anesthesia complications

## 2016-11-30 NOTE — H&P (Signed)
Subjective:   This is an 83 year old white male has had some neck pain. X-rays demonstrated C1-2 instability. I discussed the various treatment options with the patient. He has decided to proceed with a posterior C1-2 instrumentation and fusion.  Past Medical History:  Diagnosis Date  . Arthritis   . BPH (benign prostatic hyperplasia)   . Cold early jan 2015   no cough or cold now  . Elevated PSA   . Erectile dysfunction   . Gilberts syndrome    elevated bile on liver tests in past  . High cholesterol   . Hyperlipidemia   . Inguinal hernia recurrent unilateral   . Nocturia   . Osteoarthrosis and allied disorders   . Other and unspecified hyperlipidemia   . Rotator cuff tear, left    partial  . Wears glasses     Past Surgical History:  Procedure Laterality Date  . CARPAL TUNNEL RELEASE Left 06/27/2013   Procedure: LEFT CARPAL TUNNEL RELEASE;  Surgeon: Cammie Sickle., MD;  Location: [facility];  Service: Orthopedics;  Laterality: Left;  . CATARACT EXTRACTION, BILATERAL  06/03/2015  . EYE SURGERY    . JOINT REPLACEMENT Right    knee  . KNEE ARTHROSCOPY     bilateral  . PROSTATE SURGERY  2004   tuna done  . right hand  1987   tendon repair   . TONSILLECTOMY     as child  . TOTAL KNEE ARTHROPLASTY  05/16/2012   Procedure: TOTAL KNEE ARTHROPLASTY;  Surgeon: Tobi Bastos, MD;  Location: [facility];  Service: Orthopedics;  Laterality: Left;  . TRANSURETHRAL RESECTION OF PROSTATE N/A 05/23/2013   Procedure: TRANSURETHRAL RESECTION OF THE PROSTATE WITH GYRUS INSTRUMENTS;  Surgeon: Ailene Rud, MD;  Location: [facility];  Service: Urology;  Laterality: N/A;    Allergies  Allergen Reactions  . Bee Venom Swelling    SWELLING REACTION UNSPECIFIED     Social History  Substance Use Topics  . Smoking status: Former Smoker    Types: Pipe    Quit date: 10/05/1982  . Smokeless tobacco: Never Used  . Alcohol use 0.6 oz/week    1 Cans of beer per week      Comment: 1 beer qod, 1 glass wine 2x week    Family History  Problem Relation Age of Onset  . COPD Mother   . Heart disease Father   . Heart attack Father   . Cancer Maternal Grandfather   . Heart attack Paternal Grandfather   . Hypertension Sister   . Hypertension Sister    Prior to Admission medications   Medication Sig Start Date End Date Taking? Authorizing Provider  aspirin EC 81 MG tablet Take 81 mg by mouth every evening.   Yes [provider]  cholecalciferol (VITAMIN D) 1000 UNITS tablet Take 2,000 Units by mouth daily.    Yes [provider]  Coenzyme Q10 200 MG capsule Take 200 mg by mouth every other day.   Yes [provider]  EPINEPHrine 0.3 mg/0.3 mL IJ SOAJ injection Inject 0.3 mLs (0.3 mg total) into the muscle once. Patient taking differently: Inject 0.3 mg into the muscle daily as needed (for allergic reaction.).  09/29/15  Yes [name], MD  Flaxseed, Linseed, (FLAX SEED OIL) 1000 MG CAPS Take 1,000 mg by mouth every evening.    Yes [provider]  Glucosamine-Chondroitin-Vit D3 1500-1200-800 MG-MG-UNIT PACK Take 1 tablet by mouth every evening.   Yes [provider]  ibuprofen (ADVIL,MOTRIN) 200 MG tablet Take 200-400 mg by mouth daily as needed (for pain).    Yes [provider]  naproxen sodium (ANAPROX) 220 MG tablet Take 220-440 mg by mouth daily as needed (for pain.).   Yes [provider]  rosuvastatin (CRESTOR) 5 MG tablet Take 1 tablet (5 mg total) by mouth at bedtime. Patient taking differently: Take 5 mg by mouth 3 (three) times a week.  07/18/16  Yes [name], MD     Review of Systems  Positive ROS: As above  All other systems have been reviewed and were otherwise negative with the exception of those mentioned in the HPI and as above.  Objective: Vital signs in last 24 hours: Temp:  [97.7 F (36.5 C)] 97.7 F (36.5 C) (08/08 0656) Pulse Rate:  [64] 64 (08/08 0656) Resp:   [20] 20 (08/08 0656) BP: (151)/(84) 151/84 (08/08 0656) SpO2:  [100 %] 100 % (08/08 0656) Weight:  [77.8 kg (171 lb 8 oz)] 77.8 kg (171 lb 8 oz) (08/08 0656)  General Appearance: Alert Head: Normocephalic, without obvious abnormality, atraumatic Eyes: PERRL, conjunctiva/corneas clear, EOM's intact,    Ears: Normal  Throat: Normal  Neck: Supple, he has a very limited cervical range of motion. Back: unremarkable Lungs: Clear to auscultation bilaterally, respirations unlabored Heart: Regular rate and rhythm, no murmur, rub or gallop Abdomen: Soft, non-tender Extremities: Extremities normal, atraumatic, no cyanosis or edema Skin: unremarkable  NEUROLOGIC:   Mental status: alert and oriented,Motor Exam - grossly normal Sensory Exam - grossly normal Reflexes:  Coordination - grossly normal Gait - grossly normal Balance - grossly normal Cranial Nerves: I: smell Not tested  II: visual acuity  OS: Normal  OD: Normal   II: visual fields Full to confrontation  II: pupils Equal, round, reactive to light  III,VII: ptosis None  III,IV,VI: extraocular muscles  Full ROM  V: mastication Normal  V: facial light touch sensation  Normal  V,VII: corneal reflex  Present  VII: facial muscle function - upper  Normal  VII: facial muscle function - lower Normal  VIII: hearing Not tested  IX: soft palate elevation  Normal  IX,X: gag reflex Present  XI: trapezius strength  5/5  XI: sternocleidomastoid strength 5/5  XI: neck flexion strength  5/5  XII: tongue strength  Normal    Data Review Lab Results  Component Value Date   WBC 7.1 11/17/2016   HGB 15.6 11/17/2016   HCT 48.1 11/17/2016   MCV 92.0 11/17/2016   PLT 194 11/17/2016   Lab Results  Component Value Date   NA 139 11/17/2016   K 4.3 11/17/2016   CL 102 11/17/2016   CO2 28 11/17/2016   BUN 19 11/17/2016   CREATININE 0.88 11/17/2016   GLUCOSE 84 11/17/2016   Lab Results  Component Value Date   INR 1.03 05/08/2012     Assessment/Plan: Atlantoaxial instability: I have discussed the situation with the patient and reviewed his imaging studies with him. We have discussed the various treatment options including surgery. I have described the surgical treatment option of a posterior cervical instrumentation and fusion. I have shown him surgical models. We have discussed the risks, benefits, alternatives, expected postoperative course, and likelihood of achieving her goals with surgery. I have answered all the patient's, and his wife's, questions. He has decided to proceed with surgery.   Desarai Barrack D 11/30/2016 8:15 AM

## 2016-12-01 ENCOUNTER — Encounter (HOSPITAL_COMMUNITY): Payer: Self-pay | Admitting: *Deleted

## 2016-12-01 MED ORDER — DOCUSATE SODIUM 100 MG PO CAPS: 100.0000 mg | ORAL_CAPSULE | Freq: Two times a day (BID) | ORAL | 0 refills | Status: DC

## 2016-12-01 MED ORDER — OXYCODONE HCL 5 MG PO TABS: 5.0000 mg | ORAL_TABLET | ORAL | 0 refills | Status: DC | PRN

## 2016-12-01 NOTE — Discharge Summary (Signed)
Physician Discharge Summary  Patient ID: Cameron [name]. MRN: [id number] DOB/AGE: 81-Jan-1934 81 y.o.  Admit date: 11/30/2016 Discharge date: 12/01/2016  Admission Diagnoses:Atlantoaxial  instability, cervicalgia  Discharge Diagnoses: The same Active Problems:   Atlantoaxial instability   Discharged Condition: good  Hospital Course: I performed a posterior atlantoaxial instrumentation and fusion on the patient on 11/30/2016. The surgery went well.  The patient's postoperative course was unremarkable. On postoperative day #1 the patient requested discharge to home. The patient, and his wife, were given oral and written discharge instructions. All their questions were answered.  Consults: None Significant Diagnostic Studies: None Treatments: Atlantoaxial instrumentation and fusion Discharge Exam: Blood pressure 140/66, pulse 73, temperature 98.6 F (37 C), temperature source Oral, resp. rate 18, height 5\' 10"  (1.778 m), weight 77.6 kg (171 lb), SpO2 99 %. The patient is alert and pleasant. His strength is normal in all 4 extremities. His dressing is clean and dry.  Disposition: Home  Discharge Instructions    Call MD for:  difficulty breathing, headache or visual disturbances    Complete by:  As directed    Call MD for:  extreme fatigue    Complete by:  As directed    Call MD for:  hives    Complete by:  As directed    Call MD for:  persistant dizziness or light-headedness    Complete by:  As directed    Call MD for:  persistant nausea and vomiting    Complete by:  As directed    Call MD for:  redness, tenderness, or signs of infection (pain, swelling, redness, odor or green/yellow discharge around incision site)    Complete by:  As directed    Call MD for:  severe uncontrolled pain    Complete by:  As directed    Call MD for:  temperature >100.4    Complete by:  As directed    Diet - low sodium heart healthy    Complete by:  As directed    Discharge instructions     Complete by:  As directed    Call [phone] for a followup appointment. Take a stool softener while you are using pain medications.   Driving Restrictions    Complete by:  As directed    Do not drive for 2 weeks.   Increase activity slowly    Complete by:  As directed    Lifting restrictions    Complete by:  As directed    Do not lift more than 5 pounds. No excessive bending or twisting.   May shower / Bathe    Complete by:  As directed    He may shower after the pain she is removed 3 days after surgery. Leave the incision alone.   Remove dressing in 48 hours    Complete by:  As directed    Your stitches are under the scan and will dissolve by themselves. The Steri-Strips will fall off after you take a few showers. Do not rub back or pick at the wound, Leave the wound alone.     Allergies as of 12/01/2016      Reactions   Bee Venom Swelling   SWELLING REACTION UNSPECIFIED       Medication List    STOP taking these medications   ibuprofen 200 MG tablet Commonly known as:  ADVIL,MOTRIN   naproxen sodium 220 MG tablet Commonly known as:  ANAPROX     TAKE these medications   aspirin EC 81 MG  tablet Take 81 mg by mouth every evening.   cholecalciferol 1000 units tablet Commonly known as:  VITAMIN [name] Take 2,000 Units by mouth daily.   Coenzyme Q10 200 MG capsule Take 200 mg by mouth every other day.   docusate sodium 100 MG capsule Commonly known as:  COLACE Take 1 capsule (100 mg total) by mouth 2 (two) times daily.   EPINEPHrine 0.3 mg/0.3 mL Soaj injection Commonly known as:  EPI-PEN Inject 0.3 mLs (0.3 mg total) into the muscle once. What changed:  when to take this  reasons to take this   Flax Seed Oil 1000 MG Caps Take 1,000 mg by mouth every evening.   Glucosamine-Chondroitin-Vit D3 1500-1200-800 MG-MG-UNIT Pack Take 1 tablet by mouth every evening.   oxyCODONE 5 MG immediate release tablet Commonly known as:  Oxy IR/ROXICODONE Take 1-2 tablets  (5-10 mg total) by mouth every 4 (four) hours as needed for moderate pain.   rosuvastatin 5 MG tablet Commonly known as:  CRESTOR Take 1 tablet (5 mg total) by mouth at bedtime. What changed:  when to take this        Signed: Jireh Vinas [name] 12/01/2016, 11:44 AM

## 2016-12-01 NOTE — Progress Notes (Signed)
Patient is discharged from room 3C08 at this time. Alert and in stable condition. IV site d/c'd and instructions read to patient and wife with understanding verbalized. Left unit via wheelchair with all belongings at side

## 2016-12-30 DIAGNOSIS — R030 Elevated blood-pressure reading, without diagnosis of hypertension: Secondary | ICD-10-CM | POA: Diagnosis not present

## 2016-12-30 DIAGNOSIS — M532X1 Spinal instabilities, occipito-atlanto-axial region: Secondary | ICD-10-CM | POA: Diagnosis not present

## 2016-12-30 DIAGNOSIS — M542 Cervicalgia: Secondary | ICD-10-CM | POA: Diagnosis not present

## 2017-01-02 ENCOUNTER — Telehealth: Payer: Self-pay | Admitting: Family Medicine

## 2017-01-02 NOTE — Telephone Encounter (Signed)
Immunizations discussed

## 2017-02-01 ENCOUNTER — Other Ambulatory Visit (INDEPENDENT_AMBULATORY_CARE_PROVIDER_SITE_OTHER): Payer: Medicare Other

## 2017-02-01 DIAGNOSIS — E559 Vitamin D deficiency, unspecified: Secondary | ICD-10-CM

## 2017-02-01 DIAGNOSIS — Z23 Encounter for immunization: Secondary | ICD-10-CM

## 2017-02-01 DIAGNOSIS — E785 Hyperlipidemia, unspecified: Secondary | ICD-10-CM

## 2017-02-02 LAB — CBC WITH DIFFERENTIAL/PLATELET
BASOS ABS: 0 10*3/uL (ref 0.0–0.2)
BASOS: 0 %
EOS (ABSOLUTE): 0.2 10*3/uL (ref 0.0–0.4)
Eos: 3 %
HEMOGLOBIN: 16.6 g/dL (ref 13.0–17.7)
Hematocrit: 48.9 % (ref 37.5–51.0)
IMMATURE GRANS (ABS): 0 10*3/uL (ref 0.0–0.1)
Immature Granulocytes: 0 %
LYMPHS ABS: 1.5 10*3/uL (ref 0.7–3.1)
Lymphs: 23 %
MCH: 30.4 pg (ref 26.6–33.0)
MCHC: 33.9 g/dL (ref 31.5–35.7)
MCV: 90 fL (ref 79–97)
MONOCYTES: 6 %
Monocytes Absolute: 0.4 10*3/uL (ref 0.1–0.9)
NEUTROS ABS: 4.5 10*3/uL (ref 1.4–7.0)
Neutrophils: 68 %
Platelets: 207 10*3/uL (ref 150–379)
RBC: 5.46 x10E6/uL (ref 4.14–5.80)
RDW: 13.4 % (ref 12.3–15.4)
WBC: 6.7 10*3/uL (ref 3.4–10.8)

## 2017-02-02 LAB — HEPATIC FUNCTION PANEL
ALBUMIN: 4.7 g/dL (ref 3.5–4.7)
ALK PHOS: 89 IU/L (ref 39–117)
ALT: 15 IU/L (ref 0–44)
AST: 15 IU/L (ref 0–40)
Bilirubin Total: 1.8 mg/dL — ABNORMAL HIGH (ref 0.0–1.2)
Bilirubin, Direct: 0.37 mg/dL (ref 0.00–0.40)
TOTAL PROTEIN: 7 g/dL (ref 6.0–8.5)

## 2017-02-02 LAB — LIPID PANEL
CHOL/HDL RATIO: 3 ratio (ref 0.0–5.0)
CHOLESTEROL TOTAL: 148 mg/dL (ref 100–199)
HDL: 49 mg/dL (ref >39)
LDL CALC: 85 mg/dL (ref 0–99)
Triglycerides: 68 mg/dL (ref 0–149)
VLDL CHOLESTEROL CAL: 14 mg/dL (ref 5–40)

## 2017-02-02 LAB — BMP8+EGFR
BUN/Creatinine Ratio: 22 (ref 10–24)
BUN: 17 mg/dL (ref 8–27)
CO2: 26 mmol/L (ref 20–29)
CREATININE: 0.76 mg/dL (ref 0.76–1.27)
Calcium: 9.9 mg/dL (ref 8.6–10.2)
Chloride: 99 mmol/L (ref 96–106)
GFR, EST AFRICAN AMERICAN: 98 mL/min/{1.73_m2} (ref >59)
GFR, EST NON AFRICAN AMERICAN: 85 mL/min/{1.73_m2} (ref >59)
GLUCOSE: 106 mg/dL — AB (ref 65–99)
Potassium: 3.9 mmol/L (ref 3.5–5.2)
SODIUM: 140 mmol/L (ref 134–144)

## 2017-02-02 LAB — VITAMIN D 25 HYDROXY (VIT D DEFICIENCY, FRACTURES): VIT D 25 HYDROXY: 52.6 ng/mL (ref 30.0–100.0)

## 2017-02-15 ENCOUNTER — Ambulatory Visit (INDEPENDENT_AMBULATORY_CARE_PROVIDER_SITE_OTHER): Payer: Medicare Other | Admitting: Family Medicine

## 2017-02-15 ENCOUNTER — Encounter: Payer: Self-pay | Admitting: Family Medicine

## 2017-02-15 VITALS — BP 122/71 | HR 63 | Temp 97.1°F | Ht 70.0 in | Wt 172.0 lb

## 2017-02-15 DIAGNOSIS — E785 Hyperlipidemia, unspecified: Secondary | ICD-10-CM | POA: Diagnosis not present

## 2017-02-15 DIAGNOSIS — I6529 Occlusion and stenosis of unspecified carotid artery: Secondary | ICD-10-CM | POA: Diagnosis not present

## 2017-02-15 DIAGNOSIS — K4090 Unilateral inguinal hernia, without obstruction or gangrene, not specified as recurrent: Secondary | ICD-10-CM

## 2017-02-15 DIAGNOSIS — R972 Elevated prostate specific antigen [PSA]: Secondary | ICD-10-CM | POA: Diagnosis not present

## 2017-02-15 DIAGNOSIS — E559 Vitamin D deficiency, unspecified: Secondary | ICD-10-CM

## 2017-02-15 MED ORDER — ROSUVASTATIN CALCIUM 5 MG PO TABS: 5.0000 mg | ORAL_TABLET | Freq: Every day | ORAL | 3 refills | Status: DC

## 2017-02-15 NOTE — Patient Instructions (Addendum)
Medicare Annual Wellness Visit  [facility] and the medical providers at [facility] strive to bring you the best medical care.  In doing so we not only want to address your current medical conditions and concerns but also to detect new conditions early and prevent illness, disease and health-related problems.    Medicare offers a yearly Wellness Visit which allows our clinical staff to assess your need for preventative services including immunizations, lifestyle education, counseling to decrease risk of preventable diseases and screening for fall risk and other medical concerns.    This visit is provided free of charge (no copay) for all Medicare recipients. The clinical pharmacists at [facility] have begun to conduct these Wellness Visits which will also include a thorough review of all your medications.    As you primary medical provider recommend that you make an appointment for your Annual Wellness Visit if you have not done so already this year.  You may set up this appointment before you leave today or you may call back ([phone]) and schedule an appointment.  Please make sure when you call that you mention that you are scheduling your Annual Wellness Visit with the clinical pharmacist so that the appointment may be made for the proper length of time.     Continue current medications. Continue good therapeutic lifestyle changes which include good diet and exercise. Fall precautions discussed with patient. If an FOBT was given today- please return it to our front desk. If you are over 21 years old - you may need Prevnar 72 or the adult Pneumonia vaccine.  **Flu shots are available--- please call and schedule a FLU-CLINIC appointment**  After your visit with [place] today you will receive a survey in the mail or online from [employer] regarding your care with [place]. Please take a moment to fill this out. Your feedback is very  important to [place] as you can help [place] better understand your patient needs as well as improve your experience and satisfaction. WE CARE ABOUT YOU!!!  Check with your insurance regarding the new shingles shot Follow-up with neurosurgery as planned Do not do anything crazy like climbing on ladders and falling off of roofs. Continue with aspirin as you are currently doing and continue with your Crestor and continue with aggressive therapeutic lifestyle changes!!

## 2017-02-15 NOTE — Progress Notes (Signed)
Subjective:    Patient ID: Cameron Moh., male    DOB: January 30, 1933, 83 y.o.   MRN: [id number]  HPI Pt here for follow up and management of chronic medical problems which includes hyperlipidemia. He is taking medication regularly.  Patient is atlantoaxial instability and status post surgery for this.  He had fusion of this on August 8 of this year by Dr. Arnoldo Morale.  The patient will follow up with a neurosurgeon sometime in December.  The patient has questions about aspirin and Crestor.  He will be given an FOBT to return and we will go over the lab work that has been done with him during the visit today.  All cholesterol numbers with traditional lipid testing were excellent.  The CBC had a normal white blood cell count.  The hemoglobin was good at 16.6 and the platelet count was adequate.  Blood sugar was slightly elevated at 106 with a normal creatinine and normal electrolytes.  The vitamin D level was excellent at 52.6.  The total bilirubin remains elevated as it has been in the past because of his Gilbert's disease.  All the other liver function tests are normal.  The patient denies any chest pain pressure tightness or shortness of breath.  He has had some congestion and drainage but this is clear in color and not purulent.  The patient is having no trouble with nausea vomiting diarrhea blood in the stool or black tarry bowel movements.  He is passing his water well and he has had a TURP in the past.  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  He does have an appointment to follow-up with a neurosurgeon in December and he is beginning to do more activity with playing golf and walking.    Patient Active Problem List   Diagnosis Date Noted  . 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 History of bilateral inguinal hernias 09/01/2015  . Arthralgia of hands, bilateral 02/13/2014  . Benign prostatic hypertrophy 05/23/2013  . Vitamin D deficiency 04/11/2013  . Postoperative anemia due to acute blood loss 05/18/2012    . Gilberts syndrome   . Inguinal hernia recurrent unilateral   . Elevated PSA   . Erectile dysfunction   . Hyperlipidemia   . Osteoarthrosis and allied disorders    Outpatient Encounter Prescriptions as of 02/15/2017  Medication Sig  . aspirin EC 81 MG tablet Take 81 mg by mouth every evening.  . cholecalciferol (VITAMIN D) 1000 UNITS tablet Take 2,000 Units by mouth daily.   . Coenzyme Q10 200 MG capsule Take 200 mg by mouth every other day.  . Flaxseed, Linseed, (FLAX SEED OIL) 1000 MG CAPS Take 1,000 mg by mouth every evening.   . Glucosamine-Chondroitin-Vit D3 1500-1200-800 MG-MG-UNIT PACK Take 1 tablet by mouth every evening.  . rosuvastatin (CRESTOR) 5 MG tablet Take 1 tablet (5 mg total) by mouth at bedtime. (Patient taking differently: Take 5 mg by mouth 3 (three) times a week. )  . EPINEPHrine 0.3 mg/0.3 mL IJ SOAJ injection Inject 0.3 mLs (0.3 mg total) into the muscle once. (Patient not taking: Reported on 02/15/2017)  . [DISCONTINUED] docusate sodium (COLACE) 100 MG capsule Take 1 capsule (100 mg total) by mouth 2 (two) times daily.  . [DISCONTINUED] oxyCODONE (OXY IR/ROXICODONE) 5 MG immediate release tablet Take 1-2 tablets (5-10 mg total) by mouth every 4 (four) hours as needed for moderate pain.   No facility-administered encounter medications on file as of 02/15/2017.       Review of  Systems  Constitutional: Negative.   HENT: Negative.   Eyes: Negative.   Respiratory: Negative.   Cardiovascular: Negative.   Gastrointestinal: Negative.   Endocrine: Negative.   Genitourinary: Negative.   Musculoskeletal: Negative.   Skin: Negative.   Allergic/Immunologic: Negative.   Neurological: Negative.   Hematological: Negative.   Psychiatric/Behavioral: Negative.        Objective:   Physical Exam  Constitutional: He is oriented to person, place, and time. He appears well-developed and well-nourished.  She is pleasant and relaxed and doing well following his cervical  surgery with rod placement and cervical fusion by Dr. Arnoldo Morale.  HENT:  Head: Normocephalic and atraumatic.  Right Ear: External ear normal.  Left Ear: External ear normal.  Nose: Nose normal.  Mouth/Throat: Oropharynx is clear and moist. No oropharyngeal exudate.  Eyes: Pupils are equal, round, and reactive to light. Conjunctivae and EOM are normal. Right eye exhibits no discharge. Left eye exhibits no discharge. No scleral icterus.  Neck: Normal range of motion. Neck supple. No thyromegaly present.  No bruits thyromegaly or anterior cervical adenopathy.  There is cervical rigidity following the surgery.  Cardiovascular: Normal rate, regular rhythm, normal heart sounds and intact distal pulses.   No murmur heard. Heart has a regular rate and rhythm at 60/min  Pulmonary/Chest: Effort normal and breath sounds normal. No respiratory distress. He has no wheezes. He has no rales. He exhibits no tenderness.  Clear anteriorly and posteriorly without axillary adenopathy and no chest wall masses.  Abdominal: Soft. Bowel sounds are normal. He exhibits no mass. There is no tenderness. There is no rebound and no guarding.  No liver or spleen enlargement no epigastric or suprapubic tenderness.  There is a right inguinal hernia that is present and currently not having any problems with this.  The patient is aware of this and understands if he develops problems he should get to the emergency room.  Musculoskeletal: Normal range of motion. He exhibits no edema.  Nuchal rigidity following cervical fusion  Lymphadenopathy:    He has no cervical adenopathy.  Neurological: He is alert and oriented to person, place, and time. He has normal reflexes. No cranial nerve deficit.  Skin: Skin is warm and dry. No rash noted.  Psychiatric: He has a normal mood and affect. His behavior is normal. Judgment and thought content normal.  Nursing note and vitals reviewed.  BP 122/71 (BP Location: Left Arm)   Pulse 63    Temp (!) 97.1 F (36.2 C) (Oral)   Ht 5\' 10"  (1.778 m)   Wt 172 lb (78 kg)   BMI 24.68 kg/m         Assessment & Plan:  1. Hyperlipidemia, unspecified hyperlipidemia type -Continue with Crestor is doing  2. Gilberts syndrome -All liver function tests were normal other than the total bilirubin which has been elevated for years.  3. Vitamin D deficiency -Continue with vitamin D replacement  4. Carotid atherosclerosis, unspecified laterality -Continue with Crestor and healthy living habits and with the baby aspirin 3 times weekly  5. Elevated PSA -Follow-up with urology as planned  6. Carotid artery calcification, unspecified laterality -When Dopplers were done most recently there was 50% stenosis. -Repeat ultrasound in 18 months  7. Right inguinal hernia -Go to the hospital if any pain occurs with a hernia.  Meds ordered this encounter  Medications  . rosuvastatin (CRESTOR) 5 MG tablet    Sig: Take 1 tablet (5 mg total) by mouth at bedtime.  Dispense:  90 tablet    Refill:  3   Patient Instructions                       Medicare Annual Wellness Visit  [facility] and the medical providers at [facility] strive to bring you the best medical care.  In doing so we not only want to address your current medical conditions and concerns but also to detect new conditions early and prevent illness, disease and health-related problems.    Medicare offers a yearly Wellness Visit which allows our clinical staff to assess your need for preventative services including immunizations, lifestyle education, counseling to decrease risk of preventable diseases and screening for fall risk and other medical concerns.    This visit is provided free of charge (no copay) for all Medicare recipients. The clinical pharmacists at [facility] have begun to conduct these Wellness Visits which will also include a thorough review of all your medications.      As you primary medical provider recommend that you make an appointment for your Annual Wellness Visit if you have not done so already this year.  You may set up this appointment before you leave today or you may call back ([phone]) and schedule an appointment.  Please make sure when you call that you mention that you are scheduling your Annual Wellness Visit with the clinical pharmacist so that the appointment may be made for the proper length of time.     Continue current medications. Continue good therapeutic lifestyle changes which include good diet and exercise. Fall precautions discussed with patient. If an FOBT was given today- please return it to our front desk. If you are over 78 years old - you may need Prevnar 74 or the adult Pneumonia vaccine.  **Flu shots are available--- please call and schedule a FLU-CLINIC appointment**  After your visit with [place] today you will receive a survey in the mail or online from [employer] regarding your care with [place]. Please take a moment to fill this out. Your feedback is very important to [place] as you can help [place] better understand your patient needs as well as improve your experience and satisfaction. WE CARE ABOUT YOU!!!  Check with your insurance regarding the new shingles shot Follow-up with neurosurgery as planned Do not do anything crazy like climbing on ladders and falling off of roofs. Continue with aspirin as you are currently doing and continue with your Crestor and continue with aggressive therapeutic lifestyle changes!!   Arrie Senate MD

## 2017-03-06 ENCOUNTER — Ambulatory Visit (INDEPENDENT_AMBULATORY_CARE_PROVIDER_SITE_OTHER): Payer: Medicare Other | Admitting: *Deleted

## 2017-03-06 ENCOUNTER — Telehealth: Payer: Self-pay | Admitting: Family Medicine

## 2017-03-06 DIAGNOSIS — Z23 Encounter for immunization: Secondary | ICD-10-CM

## 2017-03-06 NOTE — Progress Notes (Signed)
Pt given Shingrix vaccine Tolerated well 

## 2017-03-06 NOTE — Telephone Encounter (Signed)
Pt notified okay to get Shingrix Price today is $47.00

## 2017-04-11 DIAGNOSIS — D1801 Hemangioma of skin and subcutaneous tissue: Secondary | ICD-10-CM | POA: Diagnosis not present

## 2017-04-11 DIAGNOSIS — L814 Other melanin hyperpigmentation: Secondary | ICD-10-CM | POA: Diagnosis not present

## 2017-04-11 DIAGNOSIS — L720 Epidermal cyst: Secondary | ICD-10-CM | POA: Diagnosis not present

## 2017-04-11 DIAGNOSIS — L570 Actinic keratosis: Secondary | ICD-10-CM | POA: Diagnosis not present

## 2017-04-11 DIAGNOSIS — Z85828 Personal history of other malignant neoplasm of skin: Secondary | ICD-10-CM | POA: Diagnosis not present

## 2017-04-11 DIAGNOSIS — L821 Other seborrheic keratosis: Secondary | ICD-10-CM | POA: Diagnosis not present

## 2017-04-11 DIAGNOSIS — L853 Xerosis cutis: Secondary | ICD-10-CM | POA: Diagnosis not present

## 2017-05-15 DIAGNOSIS — L720 Epidermal cyst: Secondary | ICD-10-CM | POA: Diagnosis not present

## 2017-05-15 DIAGNOSIS — Z85828 Personal history of other malignant neoplasm of skin: Secondary | ICD-10-CM | POA: Diagnosis not present

## 2017-06-02 DIAGNOSIS — G5601 Carpal tunnel syndrome, right upper limb: Secondary | ICD-10-CM | POA: Diagnosis not present

## 2017-06-02 DIAGNOSIS — M65331 Trigger finger, right middle finger: Secondary | ICD-10-CM | POA: Diagnosis not present

## 2017-07-11 DIAGNOSIS — M532X1 Spinal instabilities, occipito-atlanto-axial region: Secondary | ICD-10-CM | POA: Diagnosis not present

## 2017-07-11 DIAGNOSIS — R030 Elevated blood-pressure reading, without diagnosis of hypertension: Secondary | ICD-10-CM | POA: Diagnosis not present

## 2017-07-18 ENCOUNTER — Ambulatory Visit: Payer: Medicare Other | Admitting: Physical Therapy

## 2017-07-19 ENCOUNTER — Ambulatory Visit: Payer: Medicare Other | Attending: Neurosurgery | Admitting: Physical Therapy

## 2017-07-19 ENCOUNTER — Encounter: Payer: Self-pay | Admitting: Physical Therapy

## 2017-07-19 DIAGNOSIS — R293 Abnormal posture: Secondary | ICD-10-CM | POA: Insufficient documentation

## 2017-07-19 DIAGNOSIS — M542 Cervicalgia: Secondary | ICD-10-CM | POA: Insufficient documentation

## 2017-07-19 NOTE — Therapy (Addendum)
[facility] [place], [place], [address] Phone: [phone]   Fax:  [phone]  Physical Therapy Evaluation  Patient Details  Name: Cameron [name]. MRN: [id number] Date of Birth: Sep 14, 1932 Referring Provider: Jeanelle Malling, MD   Encounter Date: 07/19/2017  PT End of Session - 07/19/17 1524    Visit Number  1    Number of Visits  8    Date for PT Re-Evaluation  08/16/17    PT Start Time  1300    PT Stop Time  1335    PT Time Calculation (min)  35 min    Activity Tolerance  Patient tolerated treatment well    Behavior During Therapy  [facility] for tasks assessed/performed       Past Medical History:  Diagnosis Date  . Arthritis   . BPH (benign prostatic hyperplasia)   . Cold early jan 2015   no cough or cold now  . Elevated PSA   . Erectile dysfunction   . Gilberts syndrome    elevated bile on liver tests in past  . High cholesterol   . Hyperlipidemia   . Inguinal hernia recurrent unilateral   . Nocturia   . Osteoarthrosis and allied disorders   . Other and unspecified hyperlipidemia   . Rotator cuff tear, left    partial  . Wears glasses     Past Surgical History:  Procedure Laterality Date  . CARPAL TUNNEL RELEASE Left 06/27/2013   Procedure: LEFT CARPAL TUNNEL RELEASE;  Surgeon: Cammie Sickle., MD;  Location: [facility];  Service: Orthopedics;  Laterality: Left;  . CATARACT EXTRACTION, BILATERAL  06/03/2015  . EYE SURGERY    . JOINT REPLACEMENT Right    knee  . KNEE ARTHROSCOPY     bilateral  . POSTERIOR CERVICAL FUSION/FORAMINOTOMY N/A 11/30/2016   Procedure: Cervical one-Cervical two Posterior Instrumentation and Fusion;  Surgeon: Newman Pies, MD;  Location: [facility];  Service: Neurosurgery;  Laterality: N/A;  . PROSTATE SURGERY  2004   tuna done  . right hand  1987   tendon repair   . TONSILLECTOMY     as child  . TOTAL KNEE ARTHROPLASTY  05/16/2012   Procedure: TOTAL KNEE  ARTHROPLASTY;  Surgeon: Tobi Bastos, MD;  [facility];  Service: Orthopedics;  Laterality: Left;  . TRANSURETHRAL RESECTION OF PROSTATE N/A 05/23/2013   Procedure: TRANSURETHRAL RESECTION OF THE PROSTATE WITH GYRUS INSTRUMENTS;  Surgeon: Ailene Rud, MD;  Location: [facility];  Service: Urology;  Laterality: N/A;    There were no vitals filed for this visit.   Subjective Assessment - 07/19/17 1514    Subjective  Patient arrives to physical therapy with reports of neck stiffness status post C1-C2 posterior fusion, 11/30/16. Patient reported he did not receive physical therapy after the surgery.  Patient reports no pain or neurological symptoms during any movement. He reports most difficulty with turning his head side to side and when looking up; no stiffness or restrictions looking down. Patient reported he knew the surgery may cause increased neck stiffness and loss of ROM but was open to try physical therapy to improve movement for ADLs and recreational activities. Patient reports his stiffness does not limit his daily activities. Patient's goal is to improve neck movements for ADLs and recreational activities.    Pertinent History  C1-C2 posterior fusion    Diagnostic tests  MRI, X-Ray    Patient Stated Goals  improve movement  Currently in Pain?  No/denies         [facility] PT Assessment - 07/19/17 0001      Assessment   Medical Diagnosis  cervicalgia    Referring Provider  Jeanelle Malling, MD    Onset Date/Surgical Date  11/30/16    Next MD Visit  September 2019    Prior Therapy  yes      Precautions   Precautions  None      Restrictions   Weight Bearing Restrictions  No      Balance Screen   Has the patient fallen in the past 6 months  No    Has the patient had a decrease in activity level because of a fear of falling?   No    Is the patient reluctant to leave their home because of a fear of falling?   No      Home Environment   Living Environment  Assisted  living      Prior Function   Level of Independence  Independent    Vocation  Retired      Observation/Other Assessments   Skin Integrity  Posterior incision healed, clean, intact, and flexible      Sensation   Light Touch  Appears Intact      Posture/Postural Control   Posture/Postural Control  Postural limitations    Postural Limitations  Rounded Shoulders;Forward head      ROM / Strength   AROM / PROM / Strength  Strength;AROM      AROM   Overall AROM   Deficits    AROM Assessment Site  Cervical    Cervical Flexion  35    Cervical Extension  6    Cervical - Right Rotation  25    Cervical - Left Rotation  19      Strength   Overall Strength  Within functional limits for tasks performed    Strength Assessment Site  Cervical    Cervical Flexion  5/5    Cervical Extension  5/5    Cervical - Right Rotation  5/5    Cervical - Left Rotation  5/5      Palpation   Palpation comment  No significant findings in cervical spine, upper thoracic or upper trapezius              No data recorded  Objective measurements completed on examination: See above findings.                   PT Long Term Goals - 07/19/17 1526      PT LONG TERM GOAL #1   Title  Patient will be independent with HEP    Time  4    Period  Weeks    Status  New      PT LONG TERM GOAL #2   Title  Patient will improve throacic and lumbar rotation to compensate for loss of cervical ROM.    Time  4    Period  Weeks    Status  New             Plan - 07/19/17 1517    Clinical Impression Statement  Patient is an 84 year old male who present to physical therapy status post C1-C2 posterior fusion, 11/30/17. Patient is very limited with AROM cervical rotation, L>R, and cervical flexion. Patient has good cervical strength within his ROM. Patient demonstrated rounded shoulders and forward head in sitting and standing. No significant tenderness to palpation  along cervical spine, upper  thoracic paraspinals or upper trapezius. Patient educated on how fusion site is the level of the cervical spine is where most of the cervical rotation occurs. Patient reported understanding. Patient may benefit from skilled physical therapy to improve motion, improve posture, and to educate on compensatory motions to address cervical rotation deficits.     Clinical Presentation  Stable    Clinical Decision Making  Low    Rehab Potential  Fair    Clinical Impairments Affecting Rehab Potential  C1-C2 posterior fusion    PT Frequency  2x / week    PT Duration  4 weeks    PT Treatment/Interventions  Manual techniques;Cryotherapy;Moist Heat;Passive range of motion;Therapeutic activities;Therapeutic exercise    PT Next Visit Plan  UBE, postural exercises, thoracic rotation, cervical rotation, cervical strengthening    PT Home Exercise Plan  scapular retractions, chin tucks, cervical rotation and extension    Consulted and Agree with Plan of Care  Patient       Patient will benefit from skilled therapeutic intervention in order to improve the following deficits and impairments:  Decreased range of motion, Hypomobility, Postural dysfunction  Visit Diagnosis: Cervicalgia - Plan: PT plan of care cert/re-cert  Abnormal posture - Plan: PT plan of care cert/re-cert     Problem List Patient Active Problem List   Diagnosis Date Noted  . Right inguinal hernia 02/15/2017  . Carotid artery calcification 02/15/2017  . 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 History of bilateral inguinal hernias 09/01/2015  . Arthralgia of hands, bilateral 02/13/2014  . Benign prostatic hypertrophy 05/23/2013  . Vitamin D deficiency 04/11/2013  . Postoperative anemia due to acute blood loss 05/18/2012  . Gilberts syndrome   . Inguinal hernia recurrent unilateral   . Elevated PSA   . Erectile dysfunction   . Hyperlipidemia   . Osteoarthrosis and allied disorders    Gabriela Eves, PT, DPT 07/19/2017, 7:39  PM  [facility] [address] [place], [place], [address] Phone: [phone]   Fax:  [phone]  Name: Cameron [name]. MRN: [id number] Date of Birth: 1932/10/06

## 2017-07-19 NOTE — Addendum Note (Signed)
Addended by: Gabriela Eves on: 07/19/2017 07:34 PM   Modules accepted: Orders

## 2017-07-25 ENCOUNTER — Ambulatory Visit: Payer: Medicare Other | Attending: Neurosurgery | Admitting: Physical Therapy

## 2017-07-25 DIAGNOSIS — M542 Cervicalgia: Secondary | ICD-10-CM

## 2017-07-25 DIAGNOSIS — R293 Abnormal posture: Secondary | ICD-10-CM | POA: Diagnosis not present

## 2017-07-25 NOTE — Therapy (Signed)
[facility] [place], [place], [address] Phone: [phone]   Fax:  [phone]  Physical Therapy Treatment / Discharge  Patient Details  Name: Cameron [name]. MRN: [id number] Date of Birth: 06-23-1932 Referring Provider: Jeanelle Malling, MD   Encounter Date: 07/25/2017  PT End of Session - 07/25/17 1126    Visit Number  2    Number of Visits  8    Date for PT Re-Evaluation  08/16/17    PT Start Time  1117    PT Stop Time  1147    PT Time Calculation (min)  30 min    Activity Tolerance  Patient tolerated treatment well    Behavior During Therapy  [facility] for tasks assessed/performed       Past Medical History:  Diagnosis Date  . Arthritis   . BPH (benign prostatic hyperplasia)   . Cold early jan 2015   no cough or cold now  . Elevated PSA   . Erectile dysfunction   . Gilberts syndrome    elevated bile on liver tests in past  . High cholesterol   . Hyperlipidemia   . Inguinal hernia recurrent unilateral   . Nocturia   . Osteoarthrosis and allied disorders   . Other and unspecified hyperlipidemia   . Rotator cuff tear, left    partial  . Wears glasses     Past Surgical History:  Procedure Laterality Date  . CARPAL TUNNEL RELEASE Left 06/27/2013   Procedure: LEFT CARPAL TUNNEL RELEASE;  Surgeon: Cammie Sickle., MD;  Location: [facility];  Service: Orthopedics;  Laterality: Left;  . CATARACT EXTRACTION, BILATERAL  06/03/2015  . EYE SURGERY    . JOINT REPLACEMENT Right    knee  . KNEE ARTHROSCOPY     bilateral  . POSTERIOR CERVICAL FUSION/FORAMINOTOMY N/A 11/30/2016   Procedure: Cervical one-Cervical two Posterior Instrumentation and Fusion;  Surgeon: Newman Pies, MD;  Location: [facility];  Service: Neurosurgery;  Laterality: N/A;  . PROSTATE SURGERY  2004   tuna done  . right hand  1987   tendon repair   . TONSILLECTOMY     as child  . TOTAL KNEE ARTHROPLASTY  05/16/2012   Procedure:  TOTAL KNEE ARTHROPLASTY;  Surgeon: Tobi Bastos, MD;  [facility];  Service: Orthopedics;  Laterality: Left;  . TRANSURETHRAL RESECTION OF PROSTATE N/A 05/23/2013   Procedure: TRANSURETHRAL RESECTION OF THE PROSTATE WITH GYRUS INSTRUMENTS;  Surgeon: Ailene Rud, MD;  [facility];  Service: Urology;  Laterality: N/A;    There were no vitals filed for this visit.  Subjective Assessment - 07/25/17 1153    Subjective  Patient noted no new complaints. Patient reported he has been compliant with HEP. Patient requested to be DC'd.     Pertinent History  C1-C2 posterior fusion    Diagnostic tests  MRI, X-Ray    Patient Stated Goals  improve movement    Currently in Pain?  No/denies                       [facility] Adult PT Treatment/Exercise - 07/25/17 0001      Exercises   Exercises  Shoulder;Neck      Neck Exercises: Standing   Lift / Chop  20 reps 4# ball      Shoulder Exercises: Seated   Other Seated Exercises  Seated trunk rotation x20 followed by seated trunk extension x20  Shoulder Exercises: Standing   Row  Strengthening;10 reps;20 reps    Row Limitations  Pink XTS      Shoulder Exercises: ROM/Strengthening   UBE (Upper Arm Bike)  x6 minutes (x3' forward, x3' backward)                  PT Long Term Goals - 07/25/17 1222      PT LONG TERM GOAL #1   Title  Patient will be independent with HEP    Time  4    Period  Weeks    Status  Achieved      PT LONG TERM GOAL #2   Title  Patient will improve throacic and lumbar rotation to compensate for loss of cervical ROM.    Time  4    Period  Weeks    Status  Partially Met            Plan - 07/25/17 1205    Clinical Impression Statement  Patient was able to complete exercises with no compaints of pain. Exercises were focused trunk rotation to compensate for loss of cervical rotation. HEP provided with exercises performed today. Patient requested to be DC'd at this time.     Clinical Presentation  Stable    Clinical Decision Making  Low    Rehab Potential  Fair    Clinical Impairments Affecting Rehab Potential  C1-C2 posterior fusion    PT Frequency  2x / week    PT Duration  4 weeks    PT Treatment/Interventions  Manual techniques;Cryotherapy;Moist Heat;Passive range of motion;Therapeutic activities;Therapeutic exercise    PT Next Visit Plan  D/C    Consulted and Agree with Plan of Care  Patient       Patient will benefit from skilled therapeutic intervention in order to improve the following deficits and impairments:  Decreased range of motion, Hypomobility, Postural dysfunction  Visit Diagnosis: Cervicalgia  Abnormal posture     Problem List Patient Active Problem List   Diagnosis Date Noted  . Right inguinal hernia 02/15/2017  . Carotid artery calcification 02/15/2017  . 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 History of bilateral inguinal hernias 09/01/2015  . Arthralgia of hands, bilateral 02/13/2014  . Benign prostatic hypertrophy 05/23/2013  . Vitamin D deficiency 04/11/2013  . Postoperative anemia due to acute blood loss 05/18/2012  . Gilberts syndrome   . Inguinal hernia recurrent unilateral   . Elevated PSA   . Erectile dysfunction   . Hyperlipidemia   . Osteoarthrosis and allied disorders      PHYSICAL THERAPY DISCHARGE SUMMARY  Visits from Start of Care: 2  Current functional level related to goals / functional outcomes: See evaluation   Remaining deficits: Loss of ROM secondary to C1-C2 Fusion   Education / Equipment: HEP Plan: Patient agrees to discharge.  Patient goals were partially met. Patient is being discharged due to the patient's request.  ?????         Cameron [name], PT, DPT 07/25/2017, 12:23 PM  [facility] [place], [place], [address] Phone: [phone]   Fax:  [phone]  Name: Cameron [name]. MRN: [id number] Date of Birth:  06-Feb-1933

## 2017-07-27 ENCOUNTER — Encounter: Payer: Medicare Other | Admitting: Physical Therapy

## 2017-08-01 ENCOUNTER — Other Ambulatory Visit: Payer: Medicare Other

## 2017-08-01 DIAGNOSIS — R972 Elevated prostate specific antigen [PSA]: Secondary | ICD-10-CM | POA: Diagnosis not present

## 2017-08-01 DIAGNOSIS — I6529 Occlusion and stenosis of unspecified carotid artery: Secondary | ICD-10-CM | POA: Diagnosis not present

## 2017-08-01 DIAGNOSIS — E559 Vitamin D deficiency, unspecified: Secondary | ICD-10-CM | POA: Diagnosis not present

## 2017-08-01 DIAGNOSIS — E7800 Pure hypercholesterolemia, unspecified: Secondary | ICD-10-CM | POA: Diagnosis not present

## 2017-08-02 LAB — CBC WITH DIFFERENTIAL/PLATELET
BASOS ABS: 0 10*3/uL (ref 0.0–0.2)
BASOS: 0 %
EOS (ABSOLUTE): 0.1 10*3/uL (ref 0.0–0.4)
Eos: 2 %
Hematocrit: 50 % (ref 37.5–51.0)
Hemoglobin: 16.2 g/dL (ref 13.0–17.7)
Immature Grans (Abs): 0 10*3/uL (ref 0.0–0.1)
Immature Granulocytes: 0 %
Lymphocytes Absolute: 1.6 10*3/uL (ref 0.7–3.1)
Lymphs: 26 %
MCH: 30.3 pg (ref 26.6–33.0)
MCHC: 32.4 g/dL (ref 31.5–35.7)
MCV: 94 fL (ref 79–97)
MONOS ABS: 0.4 10*3/uL (ref 0.1–0.9)
Monocytes: 6 %
Neutrophils Absolute: 4 10*3/uL (ref 1.4–7.0)
Neutrophils: 66 %
PLATELETS: 204 10*3/uL (ref 150–379)
RBC: 5.35 x10E6/uL (ref 4.14–5.80)
RDW: 13.6 % (ref 12.3–15.4)
WBC: 6.1 10*3/uL (ref 3.4–10.8)

## 2017-08-02 LAB — BMP8+EGFR
BUN / CREAT RATIO: 25 — AB (ref 10–24)
BUN: 22 mg/dL (ref 8–27)
CHLORIDE: 101 mmol/L (ref 96–106)
CO2: 22 mmol/L (ref 20–29)
Calcium: 9.6 mg/dL (ref 8.6–10.2)
Creatinine, Ser: 0.89 mg/dL (ref 0.76–1.27)
GFR calc non Af Amer: 79 mL/min/{1.73_m2} (ref >59)
GFR, EST AFRICAN AMERICAN: 91 mL/min/{1.73_m2} (ref >59)
GLUCOSE: 96 mg/dL (ref 65–99)
POTASSIUM: 4.4 mmol/L (ref 3.5–5.2)
Sodium: 144 mmol/L (ref 134–144)

## 2017-08-02 LAB — HEPATIC FUNCTION PANEL
ALBUMIN: 4.4 g/dL (ref 3.5–4.7)
ALT: 15 IU/L (ref 0–44)
AST: 16 IU/L (ref 0–40)
Alkaline Phosphatase: 78 IU/L (ref 39–117)
BILIRUBIN TOTAL: 1.6 mg/dL — AB (ref 0.0–1.2)
Bilirubin, Direct: 0.32 mg/dL (ref 0.00–0.40)
Total Protein: 6.8 g/dL (ref 6.0–8.5)

## 2017-08-02 LAB — LIPID PANEL
CHOLESTEROL TOTAL: 130 mg/dL (ref 100–199)
Chol/HDL Ratio: 2.5 ratio (ref 0.0–5.0)
HDL: 51 mg/dL (ref >39)
LDL Calculated: 66 mg/dL (ref 0–99)
Triglycerides: 66 mg/dL (ref 0–149)
VLDL Cholesterol Cal: 13 mg/dL (ref 5–40)

## 2017-08-02 LAB — VITAMIN D 25 HYDROXY (VIT D DEFICIENCY, FRACTURES): VIT D 25 HYDROXY: 47.8 ng/mL (ref 30.0–100.0)

## 2017-08-15 ENCOUNTER — Ambulatory Visit (INDEPENDENT_AMBULATORY_CARE_PROVIDER_SITE_OTHER): Payer: Medicare Other | Admitting: Family Medicine

## 2017-08-15 ENCOUNTER — Encounter: Payer: Self-pay | Admitting: Family Medicine

## 2017-08-15 VITALS — BP 97/60 | HR 60 | Temp 96.6°F | Ht 70.0 in | Wt 170.0 lb

## 2017-08-15 DIAGNOSIS — I6529 Occlusion and stenosis of unspecified carotid artery: Secondary | ICD-10-CM | POA: Diagnosis not present

## 2017-08-15 DIAGNOSIS — E559 Vitamin D deficiency, unspecified: Secondary | ICD-10-CM | POA: Diagnosis not present

## 2017-08-15 DIAGNOSIS — E785 Hyperlipidemia, unspecified: Secondary | ICD-10-CM | POA: Diagnosis not present

## 2017-08-15 NOTE — Progress Notes (Signed)
Subjective:    Patient ID: Cameron Moh., male    DOB: 04-18-1933, 84 y.o.   MRN: [id number]  HPI Pt here for follow up and management of chronic medical problems which includes hyperlipidemia. He is taking medication regularly.  The patient is doing well today with no specific complaints.  He does not need refills on any of his medicines.  He has had lab work done and this will be reviewed with him during the visit today.  All of his cholesterol numbers were excellent with an LDL C being 66 and triglycerides being good at 66 also.  The CBC was within normal limits with a good hemoglobin and normal white blood cell count and adequate platelet count.  The blood sugar was good at 96 creatinine was normal and electrolytes including potassium were normal.  Vitamin D level was excellent at 47.8 his total bilirubin was elevated as it has been in the past but all other liver function tests were normal.  The patient had a cervical spine surgery in August.  We saw him in October.  Since then he has had a trigger finger and has had injections for this.  Patient is pleasant and alert.  He denies any chest pain or shortness of breath.  He denies any trouble with his GI tract other than his stools being a little looser but he attributes this to his diet.  He is up-to-date on his colonoscopies has not seen any blood in the stool or had any black tarry bowel movements and denies nausea and vomiting.  He was told when he had the last colonoscopy that he did not need any more colonoscopies.  He is passing his water without problems.  He is active physically.  In addition to the trigger finger repair he also had carpal tunnel on that right wrist.  Reviewing the patient's x-rays in the past he did have bilateral carotid plaque and he had aortic atherosclerosis..     Patient Active Problem List   Diagnosis Date Noted  . Right inguinal hernia 02/15/2017  . Carotid artery calcification 02/15/2017  . Atlantoaxial  instability 11/30/2016  . History of bilateral inguinal hernias 09/01/2015  . Arthralgia of hands, bilateral 02/13/2014  . Benign prostatic hypertrophy 05/23/2013  . Vitamin D deficiency 04/11/2013  . Postoperative anemia due to acute blood loss 05/18/2012  . Gilberts syndrome   . Inguinal hernia recurrent unilateral   . Elevated PSA   . Erectile dysfunction   . Hyperlipidemia   . Osteoarthrosis and allied disorders    Outpatient Encounter Medications as of 08/15/2017  Medication Sig  . aspirin EC 81 MG tablet Take 81 mg by mouth every evening.  . cholecalciferol (VITAMIN D) 1000 UNITS tablet Take 2,000 Units by mouth daily.   . Coenzyme Q10 200 MG capsule Take 200 mg by mouth every other day.  [place] Kitchen EPINEPHrine 0.3 mg/0.3 mL IJ SOAJ injection Inject 0.3 mLs (0.3 mg total) into the muscle once. (Patient not taking: Reported on 02/15/2017)  . Flaxseed, Linseed, (FLAX SEED OIL) 1000 MG CAPS Take 1,000 mg by mouth every evening.   . Glucosamine-Chondroitin-Vit D3 1500-1200-800 MG-MG-UNIT PACK Take 1 tablet by mouth every evening.  . rosuvastatin (CRESTOR) 5 MG tablet Take 1 tablet (5 mg total) by mouth at bedtime.   No facility-administered encounter medications on file as of 08/15/2017.       Review of Systems  Constitutional: Negative.   HENT: Negative.   Eyes: Negative.   Respiratory:  Negative.   Cardiovascular: Negative.   Gastrointestinal: Negative.   Endocrine: Negative.   Genitourinary: Negative.   Musculoskeletal: Negative.   Skin: Negative.   Allergic/Immunologic: Negative.   Neurological: Negative.   Hematological: Negative.   Psychiatric/Behavioral: Negative.        Objective:   Physical Exam  Constitutional: He is oriented to person, place, and time. He appears well-developed and well-nourished. No distress.  The patient is pleasant and alert and other than some stiffness in his neck he does not have any pain following the fusion surgery that he had there.  He has  had carpal tunnel surgery and he is recovering well from that.  HENT:  Head: Normocephalic and atraumatic.  Right Ear: External ear normal.  Left Ear: External ear normal.  Nose: Nose normal.  Mouth/Throat: Oropharynx is clear and moist. No oropharyngeal exudate.  Eyes: Pupils are equal, round, and reactive to light. Conjunctivae and EOM are normal. Right eye exhibits no discharge. Left eye exhibits no discharge. No scleral icterus.  Neck: Normal range of motion. Neck supple. No thyromegaly present.  Cardiovascular: Normal rate, regular rhythm, normal heart sounds and intact distal pulses.  No murmur heard. Heart has a regular rate and rhythm at 60/min  Pulmonary/Chest: Effort normal and breath sounds normal. No respiratory distress. He has no wheezes. He has no rales. He exhibits no tenderness.  Clear anteriorly and posteriorly, no axillary adenopathy or chest wall masses  Abdominal: Soft. Bowel sounds are normal. He exhibits no mass. There is no tenderness. There is no rebound and no guarding.  No liver or spleen enlargement.  No epigastric tenderness.  No suprapubic tenderness.  Good inguinal pulses.  Musculoskeletal: He exhibits no edema or tenderness.  Limited range of motion of neck from side to side due to surgery which was expected.  Lymphadenopathy:    He has no cervical adenopathy.  Neurological: He is alert and oriented to person, place, and time. He has normal reflexes. No cranial nerve deficit.  Skin: Skin is warm and dry. No rash noted.  Psychiatric: He has a normal mood and affect. His behavior is normal. Judgment and thought content normal.  Nursing note and vitals reviewed.   BP 97/60 (BP Location: Left Arm)   Pulse 60   Temp (!) 96.6 F (35.9 C) (Oral)   Ht 5\' 10"  (1.778 m)   Wt 170 lb (77.1 kg)   BMI 24.39 kg/m        Assessment & Plan:  1. Hyperlipidemia, unspecified hyperlipidemia type -Cholesterol numbers are excellent and he will continue with his  current cholesterol and therapeutic lifestyle changes  2. Gilberts syndrome -This is an ongoing issue and of course the bilirubin is always elevated with this but no more than in the past.  Patient is aware of this condition.  3. Vitamin D deficiency -Vitamin D levels were excellent and he will continue with his current vitamin D replacement.  4. Carotid atherosclerosis, unspecified laterality -The patient is very active and has an active lifestyle.  He does have aortic atherosclerosis and some mild buildup of plaque in the carotid arteries.  He is now living in [place].  He is 84 years old.  I would like for him to have a visit with a cardiologist that may be a one-time visit just to evaluate him from a cardiac standpoint.  This is not an urgent problem as he is having no symptoms.  Patient Instructions  Medicare Annual Wellness Visit  [facility] and the medical providers at [facility] strive to bring you the best medical care.  In doing so we not only want to address your current medical conditions and concerns but also to detect new conditions early and prevent illness, disease and health-related problems.    Medicare offers a yearly Wellness Visit which allows our clinical staff to assess your need for preventative services including immunizations, lifestyle education, counseling to decrease risk of preventable diseases and screening for fall risk and other medical concerns.    This visit is provided free of charge (no copay) for all Medicare recipients. The clinical pharmacists at [facility] have begun to conduct these Wellness Visits which will also include a thorough review of all your medications.    As you primary medical provider recommend that you make an appointment for your Annual Wellness Visit if you have not done so already this year.  You may set up this appointment before you leave today or you may call  back ([phone]) and schedule an appointment.  Please make sure when you call that you mention that you are scheduling your Annual Wellness Visit with the clinical pharmacist so that the appointment may be made for the proper length of time.     Continue current medications. Continue good therapeutic lifestyle changes which include good diet and exercise. Fall precautions discussed with patient. If an FOBT was given today- please return it to our front desk. If you are over 59 years old - you may need Prevnar 23 or the adult Pneumonia vaccine.  **Flu shots are available--- please call and schedule a FLU-CLINIC appointment**  After your visit with [place] today you will receive a survey in the mail or online from [employer] regarding your care with [place]. Please take a moment to fill this out. Your feedback is very important to [place] as you can help [place] better understand your patient needs as well as improve your experience and satisfaction. WE CARE ABOUT YOU!!!   Continue with a healthy lifestyle watching the diet drinking plenty of water avoiding sugars and watching the diet for cholesterol. We will arrange for you to see the cardiologist just because I want you to have a routine cardiac checkup and especially because of the aortic atherosclerosis and the mild plaque buildup in the carotid arteries.   Arrie Senate MD

## 2017-08-15 NOTE — Patient Instructions (Addendum)
Medicare Annual Wellness Visit  [facility] and the medical providers at [facility] strive to bring you the best medical care.  In doing so we not only want to address your current medical conditions and concerns but also to detect new conditions early and prevent illness, disease and health-related problems.    Medicare offers a yearly Wellness Visit which allows our clinical staff to assess your need for preventative services including immunizations, lifestyle education, counseling to decrease risk of preventable diseases and screening for fall risk and other medical concerns.    This visit is provided free of charge (no copay) for all Medicare recipients. The clinical pharmacists at [facility] have begun to conduct these Wellness Visits which will also include a thorough review of all your medications.    As you primary medical provider recommend that you make an appointment for your Annual Wellness Visit if you have not done so already this year.  You may set up this appointment before you leave today or you may call back ([phone]) and schedule an appointment.  Please make sure when you call that you mention that you are scheduling your Annual Wellness Visit with the clinical pharmacist so that the appointment may be made for the proper length of time.     Continue current medications. Continue good therapeutic lifestyle changes which include good diet and exercise. Fall precautions discussed with patient. If an FOBT was given today- please return it to our front desk. If you are over 24 years old - you may need Prevnar 51 or the adult Pneumonia vaccine.  **Flu shots are available--- please call and schedule a FLU-CLINIC appointment**  After your visit with [place] today you will receive a survey in the mail or online from [employer] regarding your care with [place]. Please take a moment to fill this out. Your feedback is very  important to [place] as you can help [place] better understand your patient needs as well as improve your experience and satisfaction. WE CARE ABOUT YOU!!!   Continue with a healthy lifestyle watching the diet drinking plenty of water avoiding sugars and watching the diet for cholesterol. We will arrange for you to see the cardiologist just because I want you to have a routine cardiac checkup and especially because of the aortic atherosclerosis and the mild plaque buildup in the carotid arteries.

## 2017-08-15 NOTE — Addendum Note (Signed)
Addended by: Zannie Cove on: 08/15/2017 11:32 AM   Modules accepted: Orders

## 2017-08-30 ENCOUNTER — Ambulatory Visit: Payer: Medicare Other | Admitting: Cardiology

## 2017-10-04 ENCOUNTER — Encounter: Payer: Self-pay | Admitting: Cardiology

## 2017-10-04 NOTE — Progress Notes (Signed)
Cardiology Office Note   Date:  10/05/2017   ID:  Cameron Moh., DOB 03-13-1933, MRN [id number]  PCP:  [name], MD  Cardiologist:   No primary care provider on file. Referring:  [name], MD  Chief Complaint  Patient presents with  . Abnormal ECG      History of Present Illness: Cameron [name]. is a 84 y.o. male who presents for evaluation of vascular disease.  He was noted to have mild carotid plaque.   He also has an abnormal EKG with borderline right bundle branch block and a left anterior fascicular block.  He is been an avid runner in the past and now walks routinely 4-5 times per week. The patient denies any new symptoms such as chest discomfort, neck or arm discomfort. There has been no new shortness of breath, PND or orthopnea. There have been no reported palpitations, presyncope or syncope.  However, he has significant cardiovascular risk factors with his father having a heart attack in his 55s.  The patient had a treadmill test probably 4 to 5 years ago and says this was normal.  He has had no other cardiovascular testing.   Past Medical History:  Diagnosis Date  . Arthritis   . BPH (benign prostatic hyperplasia)   . Elevated PSA   . Erectile dysfunction   . Gilberts syndrome    elevated bile on liver tests in past  . Hyperlipidemia   . Inguinal hernia recurrent unilateral   . Nocturia   . Osteoarthrosis and allied disorders   . Other and unspecified hyperlipidemia   . Rotator cuff tear, left    partial    Past Surgical History:  Procedure Laterality Date  . CARPAL TUNNEL RELEASE Left 06/27/2013   Procedure: LEFT CARPAL TUNNEL RELEASE;  Surgeon: Cammie Sickle., MD;  Location: [facility];  Service: Orthopedics;  Laterality: Left;  . CATARACT EXTRACTION, BILATERAL  06/03/2015  . EYE SURGERY    . JOINT REPLACEMENT Left    knee  . KNEE ARTHROSCOPY     bilateral  . POSTERIOR CERVICAL FUSION/FORAMINOTOMY N/A 11/30/2016   Procedure: Cervical one-Cervical two Posterior Instrumentation and Fusion;  Surgeon: Newman Pies, MD;  Location: [facility];  Service: Neurosurgery;  Laterality: N/A;  . right hand  1987   tendon repair   . TONSILLECTOMY     as child  . TOTAL KNEE ARTHROPLASTY  05/16/2012   Procedure: TOTAL KNEE ARTHROPLASTY;  Surgeon: Tobi Bastos, MD;  Location: [facility];  Service: Orthopedics;  Laterality: Left;  . TRANSURETHRAL RESECTION OF PROSTATE N/A 05/23/2013   Procedure: TRANSURETHRAL RESECTION OF THE PROSTATE WITH GYRUS INSTRUMENTS;  Surgeon: Ailene Rud, MD;  Location: [facility];  Service: Urology;  Laterality: N/A;     Current Outpatient Medications  Medication Sig Dispense Refill  . aspirin EC 81 MG tablet Take 81 mg by mouth every evening.    . cholecalciferol (VITAMIN D) 1000 UNITS tablet Take 2,000 Units by mouth daily.     . Coenzyme Q10 200 MG capsule Take 200 mg by mouth 3 (three) times a week.     Marland Kitchen EPINEPHrine 0.3 mg/0.3 mL IJ SOAJ injection Inject 0.3 mLs (0.3 mg total) into the muscle once. 1 Device 3  . Flaxseed, Linseed, (FLAX SEED OIL) 1000 MG CAPS Take 1,000 mg by mouth every evening.     . Glucosamine-Chondroitin-Vit D3 1500-1200-800 MG-MG-UNIT PACK Take 1 tablet by mouth every evening.    Marland Kitchen  rosuvastatin (CRESTOR) 5 MG tablet Take 1 tablet (5 mg total) by mouth at bedtime. (Patient taking differently: Take 5 mg by mouth 3 (three) times a week. ) 90 tablet 3   No current facility-administered medications for this visit.     Allergies:   Bee venom    Social History:  The patient  reports that he quit smoking about 35 years ago. His smoking use included pipe. He has never used smokeless tobacco. He reports that he drinks about 0.6 oz of alcohol per week. He reports that he does not use drugs.   Family History:  The patient's family history includes COPD in his mother; Cancer in his maternal grandfather; Heart attack in his paternal grandfather; Heart attack (age of onset:  26) in his father; Heart disease in his father; Hypertension in his sister and sister.    ROS:  Please see the history of present illness.   Otherwise, review of systems are positive for none.   All other systems are reviewed and negative.    PHYSICAL EXAM: VS:  BP 118/66 (BP Location: Left Arm, Patient Position: Sitting, Cuff Size: Normal)   Pulse (!) 56   Ht 5\' 10"  (1.778 m)   Wt 170 lb (77.1 kg)   BMI 24.39 kg/m  , BMI Body mass index is 24.39 kg/m. GENERAL:  Well appearing HEENT:  Pupils equal round and reactive, fundi not visualized, oral mucosa unremarkable NECK:  No jugular venous distention, waveform within normal limits, carotid upstroke brisk and symmetric, no bruits, no thyromegaly LYMPHATICS:  No cervical, inguinal adenopathy LUNGS:  Clear to auscultation bilaterally BACK:  No CVA tenderness CHEST:  Unremarkable HEART:  PMI not displaced or sustained,S1 and S2 within normal limits, no S3, no S4, no clicks, no rubs, no murmurs ABD:  Flat, positive bowel sounds normal in frequency in pitch, no bruits, no rebound, no guarding, no midline pulsatile mass, no hepatomegaly, no splenomegaly EXT:  2 plus pulses throughout, no edema, no cyanosis no clubbing SKIN:  No rashes no nodules NEURO:  Cranial nerves II through XII grossly intact, motor grossly intact throughout PSYCH:  Cognitively intact, oriented to person place and time    EKG:  EKG is ordered today. The ekg ordered today demonstrates sinus bradycardia, rate 56, axis left axis deviation with left anterior fascicular block, RSR prime V1   Recent Labs: 08/01/2017: ALT 15; BUN 22; Creatinine, Ser 0.89; Hemoglobin 16.2; Platelets 204; Potassium 4.4; Sodium 144    Lipid Panel    Component Value Date/Time   CHOL 130 08/01/2017 0926   CHOL 122 09/11/2012 0931   TRIG 66 08/01/2017 0926   TRIG 68 07/15/2016 0821   TRIG 51 09/11/2012 0931   HDL 51 08/01/2017 0926   HDL 51 07/15/2016 0821   HDL 48 09/11/2012 0931    CHOLHDL 2.5 08/01/2017 0926   LDLCALC 66 08/01/2017 0926   LDLCALC 101 (H) 01/23/2014 0926   LDLCALC 64 09/11/2012 0931      Wt Readings from Last 3 Encounters:  10/05/17 170 lb (77.1 kg)  08/15/17 170 lb (77.1 kg)  02/15/17 172 lb (78 kg)      Other studies Reviewed: Additional studies/ records that were reviewed today include: Labs. Review of the above records demonstrates:  Please see elsewhere in the note.     ASSESSMENT AND PLAN:  CAROTID ARTERY PLAQUE: He has mild to moderate plaque that can be followed clinically.  He can have risk reduction.  DYSLIPIDEMIA: His lipids are excellent  he will continue on the meds as listed.  ABNORMAL EKG: Given his risk factors I will screen him with a POET (Plain Old Exercise Treadmill)  Current medicines are reviewed at length with the patient today.  The patient does not have concerns regarding medicines.  The following changes have been made:  no change  Labs/ tests ordered today include:   Orders Placed This Encounter  Procedures  . EXERCISE TOLERANCE TEST (ETT)  . EKG 12-Lead     Disposition:   FU with me as needed.     Signed, Minus Breeding, MD  10/05/2017 10:05 AM    [facility]

## 2017-10-05 ENCOUNTER — Encounter: Payer: Self-pay | Admitting: Cardiology

## 2017-10-05 ENCOUNTER — Ambulatory Visit (INDEPENDENT_AMBULATORY_CARE_PROVIDER_SITE_OTHER): Payer: Medicare Other | Admitting: Cardiology

## 2017-10-05 VITALS — BP 118/66 | HR 56 | Ht 70.0 in | Wt 170.0 lb

## 2017-10-05 DIAGNOSIS — R9431 Abnormal electrocardiogram [ECG] [EKG]: Secondary | ICD-10-CM

## 2017-10-05 DIAGNOSIS — I6523 Occlusion and stenosis of bilateral carotid arteries: Secondary | ICD-10-CM | POA: Diagnosis not present

## 2017-10-05 NOTE — Patient Instructions (Signed)
Medication Instructions:  Continue current medications  If you need a refill on your cardiac medications before your next appointment, please call your pharmacy.  Labwork: None Ordered   Testing/Procedures: Your physician has requested that you have an exercise tolerance test. For further information please visit [email or web address]. Please also follow instruction sheet, as given.   Follow-Up: Your physician wants you to follow-up in: As Needed.     Thank you for choosing [facility]!!       

## 2017-10-10 ENCOUNTER — Telehealth (HOSPITAL_COMMUNITY): Payer: Self-pay

## 2017-10-10 NOTE — Telephone Encounter (Signed)
Encounter complete. 

## 2017-10-12 ENCOUNTER — Ambulatory Visit (HOSPITAL_COMMUNITY)
Admission: RE | Admit: 2017-10-12 | Discharge: 2017-10-12 | Disposition: A | Discharge status: Home / Self Care | Payer: Medicare Other | Source: Ambulatory Visit | Attending: Cardiology | Admitting: Cardiology

## 2017-10-12 DIAGNOSIS — R9431 Abnormal electrocardiogram [ECG] [EKG]: Secondary | ICD-10-CM | POA: Insufficient documentation

## 2017-10-12 DIAGNOSIS — G5603 Carpal tunnel syndrome, bilateral upper limbs: Secondary | ICD-10-CM | POA: Diagnosis not present

## 2017-10-12 LAB — EXERCISE TOLERANCE TEST
CHL CUP MPHR: 137 {beats}/min
CSEPED: 6 min
Estimated workload: 7.2 METS
Exercise duration (sec): 10 s
Peak HR: 123 {beats}/min
Percent HR: 89 %
RPE: 18
Rest HR: 60 {beats}/min

## 2018-01-08 DIAGNOSIS — M25512 Pain in left shoulder: Secondary | ICD-10-CM | POA: Diagnosis not present

## 2018-01-16 DIAGNOSIS — M532X1 Spinal instabilities, occipito-atlanto-axial region: Secondary | ICD-10-CM | POA: Diagnosis not present

## 2018-01-19 ENCOUNTER — Ambulatory Visit (INDEPENDENT_AMBULATORY_CARE_PROVIDER_SITE_OTHER): Payer: Medicare Other

## 2018-01-19 DIAGNOSIS — Z23 Encounter for immunization: Secondary | ICD-10-CM

## 2018-01-29 ENCOUNTER — Ambulatory Visit: Payer: Medicare Other | Admitting: *Deleted

## 2018-01-29 DIAGNOSIS — Z23 Encounter for immunization: Secondary | ICD-10-CM

## 2018-01-29 NOTE — Patient Instructions (Signed)
Recombinant Zoster (Shingles) Vaccine, RZV: What You Need to Know    1. Why get vaccinated?  Shingles (also called herpes zoster, or just zoster) is a painful skin rash, often with blisters. Shingles is caused by the varicella zoster virus, the same virus that causes chickenpox. After you have chickenpox, the virus stays in your body and can cause shingles later in life.  You can't catch shingles from another person. However, a person who has never had chickenpox (or chickenpox vaccine) could get chickenpox from someone with shingles.  A shingles rash usually appears on one side of the face or body and heals within 2 to 4 weeks. Its main symptom is pain, which can be severe. Other symptoms can include fever, headache, chills and upset stomach. Very rarely, a shingles infection can lead to pneumonia, hearing problems, blindness, brain inflammation (encephalitis), or death.  For about 1 person in 5, severe pain can continue even long after the rash has cleared up. This long-lasting pain is called post-herpetic neuralgia (PHN).  Shingles is far more common in people 50 years of age and older than in younger people, and the risk increases with age. It is also more common in people whose immune system is weakened because of a disease such as cancer, or by drugs such as steroids or chemotherapy.  At least 1 million people a year in the [place] get shingles.    2. Shingles vaccine (recombinant)  Recombinant shingles vaccine was approved by FDA in 2017 for the prevention of shingles. In clinical trials, it was more than 90% effective in preventing shingles. It can also reduce the likelihood of PHN.  Two doses, 2 to 6 months apart, are recommended for adults 50 and older.  This vaccine is also recommended for people who have already gotten the live shingles vaccine (Zostavax). There is no live virus in this vaccine.    3. Some people should not get this vaccine  Tell your vaccine provider if you:   Have any  severe, life-threatening allergies. A person who has ever had a life-threatening allergic reaction after a dose of recombinant shingles vaccine, or has a severe allergy to any component of this vaccine, may be advised not to be vaccinated. Ask your health care provider if you want information about vaccine components.   Are pregnant or breastfeeding. There is not much information about use of recombinant shingles vaccine in pregnant or nursing women. Your healthcare provider might recommend delaying vaccination.   Are not feeling well. If you have a mild illness, such as a cold, you can probably get the vaccine today. If you are moderately or severely ill, you should probably wait until you recover. Your doctor can advise you.    4. Risks of a vaccine reaction  With any medicine, including vaccines, there is a chance of reactions.  After recombinant shingles vaccination, a person might experience:   Pain, redness, soreness, or swelling at the site of the injection   Headache, muscle aches, fever, shivering, fatigue    In clinical trials, most people got a sore arm with mild or moderate pain after vaccination, and some also had redness and swelling where they got the shot. Some people felt tired, had muscle pain, a headache, shivering, fever, stomach pain, or nausea. About 1 out of 6 people who got recombinant zoster vaccine experienced side effects that prevented them from doing regular activities. Symptoms went away on their own in about 2 to 3 days. Side effects were more   common in younger people.  You should still get the second dose of recombinant zoster vaccine even if you had one of these reactions after the first dose.    Other things that could happen after this vaccine:   People sometimes faint after medical procedures, including vaccination. Sitting or lying down for about 15 minutes can help prevent fainting and injuries caused by a fall. Tell your provider if you feel dizzy or have vision changes or  ringing in the ears.   Some people get shoulder pain that can be more severe and longer-lasting than routine soreness that can follow injections. This happens very rarely.   Any medication can cause a severe allergic reaction. Such reactions to a vaccine are estimated at about 1 in a million doses, and would happen within a few minutes to a few hours after the vaccination.  As with any medicine, there is a very remote chance of a vaccine causing a serious injury or death.  The safety of vaccines is always being monitored. For more information, visit: www.cdc.gov/vaccinesafety/    5. What if there is a serious problem?  What should I look for?   Look for anything that concerns you, such as signs of a severe allergic reaction, very high fever, or unusual behavior.  Signs of a severe allergic reaction can include hives, swelling of the face and throat, difficulty breathing, a fast heartbeat, dizziness, and weakness. These would usually start a few minutes to a few hours after the vaccination.    What should I do?   If you think it is a severe allergic reaction or other emergency that can't wait, call 9-1-1 and get to the nearest hospital. Otherwise, call your health care provider.  Afterward, the reaction should be reported to the Vaccine Adverse Event Reporting System (VAERS). Your doctor should file this report, or you can do it yourself through the VAERS web site atwww.vaers.hhs.govor by calling 1-800-822-7967.  VAERS does not give medical advice.    6. How can I learn more?   Ask your healthcare provider. He or she can give you the vaccine package insert or suggest other sources of information.   Call your local or state health department.   Contact the Centers for Disease Control and Prevention (CDC):  ? Call 1-800-232-4636 (1-800-CDC-INFO) or  ? Visit the CDC's website at www.cdc.gov/vaccines  ?   CDC Vaccine Information Statement (VIS) Recombinant Zoster Vaccine (06/06/2016)  This information is not  intended to replace advice given to you by your health care provider. Make sure you discuss any questions you have with your health care provider.  Document Released: 06/21/2016 Document Revised: 06/21/2016 Document Reviewed: 06/21/2016  Elsevier Interactive Patient Education  2018 [employer] Inc[employer]

## 2018-01-29 NOTE — Progress Notes (Signed)
Shingrix given and patient tolerated well.  

## 2018-02-01 ENCOUNTER — Other Ambulatory Visit: Payer: Medicare Other

## 2018-02-01 DIAGNOSIS — E785 Hyperlipidemia, unspecified: Secondary | ICD-10-CM

## 2018-02-01 DIAGNOSIS — I6529 Occlusion and stenosis of unspecified carotid artery: Secondary | ICD-10-CM | POA: Diagnosis not present

## 2018-02-02 LAB — CMP14+EGFR
A/G RATIO: 2.1 (ref 1.2–2.2)
ALK PHOS: 76 IU/L (ref 39–117)
ALT: 19 IU/L (ref 0–44)
AST: 17 IU/L (ref 0–40)
Albumin: 4.1 g/dL (ref 3.5–4.7)
BILIRUBIN TOTAL: 1.1 mg/dL (ref 0.0–1.2)
BUN/Creatinine Ratio: 22 (ref 10–24)
BUN: 17 mg/dL (ref 8–27)
CALCIUM: 9.4 mg/dL (ref 8.6–10.2)
CHLORIDE: 101 mmol/L (ref 96–106)
CO2: 26 mmol/L (ref 20–29)
Creatinine, Ser: 0.79 mg/dL (ref 0.76–1.27)
GFR calc Af Amer: 96 mL/min/{1.73_m2} (ref >59)
GFR calc non Af Amer: 83 mL/min/{1.73_m2} (ref >59)
Globulin, Total: 2 g/dL (ref 1.5–4.5)
Glucose: 97 mg/dL (ref 65–99)
POTASSIUM: 4.2 mmol/L (ref 3.5–5.2)
SODIUM: 142 mmol/L (ref 134–144)
Total Protein: 6.1 g/dL (ref 6.0–8.5)

## 2018-02-02 LAB — CBC WITH DIFFERENTIAL/PLATELET
BASOS: 1 %
Basophils Absolute: 0 10*3/uL (ref 0.0–0.2)
EOS (ABSOLUTE): 0.2 10*3/uL (ref 0.0–0.4)
Eos: 4 %
Hematocrit: 48.1 % (ref 37.5–51.0)
Hemoglobin: 16.4 g/dL (ref 13.0–17.7)
IMMATURE GRANULOCYTES: 0 %
Immature Grans (Abs): 0 10*3/uL (ref 0.0–0.1)
LYMPHS ABS: 1.3 10*3/uL (ref 0.7–3.1)
Lymphs: 20 %
MCH: 31.2 pg (ref 26.6–33.0)
MCHC: 34.1 g/dL (ref 31.5–35.7)
MCV: 91 fL (ref 79–97)
MONOS ABS: 0.7 10*3/uL (ref 0.1–0.9)
Monocytes: 11 %
NEUTROS PCT: 64 %
Neutrophils Absolute: 4.3 10*3/uL (ref 1.4–7.0)
PLATELETS: 179 10*3/uL (ref 150–450)
RBC: 5.26 x10E6/uL (ref 4.14–5.80)
RDW: 12.5 % (ref 12.3–15.4)
WBC: 6.6 10*3/uL (ref 3.4–10.8)

## 2018-02-02 LAB — LIPID PANEL
CHOLESTEROL TOTAL: 128 mg/dL (ref 100–199)
Chol/HDL Ratio: 2.6 ratio (ref 0.0–5.0)
HDL: 49 mg/dL (ref >39)
LDL Calculated: 68 mg/dL (ref 0–99)
TRIGLYCERIDES: 55 mg/dL (ref 0–149)
VLDL Cholesterol Cal: 11 mg/dL (ref 5–40)

## 2018-02-02 LAB — HEPATIC FUNCTION PANEL: Bilirubin, Direct: 0.28 mg/dL (ref 0.00–0.40)

## 2018-02-14 ENCOUNTER — Ambulatory Visit (INDEPENDENT_AMBULATORY_CARE_PROVIDER_SITE_OTHER): Payer: Medicare Other | Admitting: Family Medicine

## 2018-02-14 ENCOUNTER — Encounter: Payer: Self-pay | Admitting: Family Medicine

## 2018-02-14 VITALS — BP 112/59 | HR 59 | Temp 96.6°F | Ht 70.0 in | Wt 172.0 lb

## 2018-02-14 DIAGNOSIS — I6529 Occlusion and stenosis of unspecified carotid artery: Secondary | ICD-10-CM

## 2018-02-14 DIAGNOSIS — E785 Hyperlipidemia, unspecified: Secondary | ICD-10-CM | POA: Diagnosis not present

## 2018-02-14 DIAGNOSIS — E559 Vitamin D deficiency, unspecified: Secondary | ICD-10-CM

## 2018-02-14 DIAGNOSIS — Z91030 Bee allergy status: Secondary | ICD-10-CM | POA: Diagnosis not present

## 2018-02-14 DIAGNOSIS — I6523 Occlusion and stenosis of bilateral carotid arteries: Secondary | ICD-10-CM

## 2018-02-14 DIAGNOSIS — M5440 Lumbago with sciatica, unspecified side: Secondary | ICD-10-CM | POA: Diagnosis not present

## 2018-02-14 DIAGNOSIS — G8929 Other chronic pain: Secondary | ICD-10-CM

## 2018-02-14 DIAGNOSIS — M5442 Lumbago with sciatica, left side: Secondary | ICD-10-CM

## 2018-02-14 MED ORDER — EPINEPHRINE 0.3 MG/0.3ML IJ SOAJ: 0.3000 mg | Freq: Once | INTRAMUSCULAR | 3 refills | Status: AC

## 2018-02-14 NOTE — Patient Instructions (Addendum)
Medicare Annual Wellness Visit  [facility] and the medical providers at [facility] strive to bring you the best medical care.  In doing so we not only want to address your current medical conditions and concerns but also to detect new conditions early and prevent illness, disease and health-related problems.    Medicare offers a yearly Wellness Visit which allows our clinical staff to assess your need for preventative services including immunizations, lifestyle education, counseling to decrease risk of preventable diseases and screening for fall risk and other medical concerns.    This visit is provided free of charge (no copay) for all Medicare recipients. The clinical pharmacists at [facility] have begun to conduct these Wellness Visits which will also include a thorough review of all your medications.    As you primary medical provider recommend that you make an appointment for your Annual Wellness Visit if you have not done so already this year.  You may set up this appointment before you leave today or you may call back ([phone]) and schedule an appointment.  Please make sure when you call that you mention that you are scheduling your Annual Wellness Visit with the clinical pharmacist so that the appointment may be made for the proper length of time.     Continue current medications. Continue good therapeutic lifestyle changes which include good diet and exercise. Fall precautions discussed with patient. If an FOBT was given today- please return it to our front desk. If you are over 29 years old - you may need Prevnar 55 or the adult Pneumonia vaccine.  **Flu shots are available--- please call and schedule a FLU-CLINIC appointment**  After your visit with [place] today you will receive a survey in the mail or online from [employer] regarding your care with [place]. Please take a moment to fill this out. Your feedback is very  important to [place] as you can help [place] better understand your patient needs as well as improve your experience and satisfaction. WE CARE ABOUT YOU!!!   Continue to stay active physically. Drink plenty of water Avoid climbing if possible to prevent falls Consider trying MG 12--this can be purchased at [facility]

## 2018-02-14 NOTE — Progress Notes (Signed)
Subjective:    Patient ID: Cameron Moh., male    DOB: 1933-03-18, 84 y.o.   MRN: [id number]  HPI Pt here for follow up and management of chronic medical problems which includes hyperlipidemia. He is taking medication regularly.  The patient is doing well overall.  He has had some arthritic problems and issues with a history of a knee replacement and cervical spine surgery.  He sees the urologist for his rectal exams.  He will be given an FOBT to return today and he has had lab work done which we will review during the visit today.  The blood sugar renal and electrolytes including potassium were good.  All of the liver function tests were normal including the total bilirubin this time.  The direct bilirubin Audelia Hives was also within normal limits.  The CBC was normal with a good hemoglobin normal white blood cell count and adequate platelet count.  All cholesterol numbers with traditional lipid testing were excellent with an LDL C being good at 68 and the good cholesterol being good at 49.  He does see the urologist regularly.  He is requesting a refill on his EpiPen and wants I DO NOT RESUSCITATE form filled out for himself.  The patient today denies any chest pain pressure tightness or shortness of breath.  He has ongoing problems with his back and knees.  He tries to walk regularly.  He denies any trouble with swallowing heartburn indigestion nausea vomiting diarrhea blood in the stool or black tarry bowel movements.  He is passing his water well.  He still has some nocturia.  He has had a TURP in the past.  He does not see the urologist anymore.     Patient Active Problem List   Diagnosis Date Noted  . Right inguinal hernia 02/15/2017  . Carotid artery calcification 02/15/2017  . 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 History of bilateral inguinal hernias 09/01/2015  . Arthralgia of hands, bilateral 02/13/2014  . Benign prostatic hypertrophy 05/23/2013  . Vitamin D deficiency 04/11/2013  .  Postoperative anemia due to acute blood loss 05/18/2012  . Gilberts syndrome   . Inguinal hernia recurrent unilateral   . Elevated PSA   . Erectile dysfunction   . Hyperlipidemia   . Osteoarthrosis and allied disorders    Outpatient Encounter Medications as of 02/14/2018  Medication Sig  . aspirin EC 81 MG tablet Take 81 mg by mouth every evening.  . cholecalciferol (VITAMIN D) 1000 UNITS tablet Take 2,000 Units by mouth daily.   . Coenzyme Q10 200 MG capsule Take 200 mg by mouth 3 (three) times a week.   Marland Kitchen EPINEPHrine 0.3 mg/0.3 mL IJ SOAJ injection Inject 0.3 mLs (0.3 mg total) into the muscle once.  . Flaxseed, Linseed, (FLAX SEED OIL) 1000 MG CAPS Take 1,000 mg by mouth every evening.   . Glucosamine-Chondroitin-Vit D3 1500-1200-800 MG-MG-UNIT PACK Take 1 tablet by mouth every evening.  . rosuvastatin (CRESTOR) 5 MG tablet Take 1 tablet (5 mg total) by mouth at bedtime. (Patient taking differently: Take 5 mg by mouth 3 (three) times a week. )   No facility-administered encounter medications on file as of 02/14/2018.      Review of Systems  Constitutional: Negative.   HENT: Negative.   Eyes: Negative.   Respiratory: Negative.   Cardiovascular: Negative.   Gastrointestinal: Negative.   Endocrine: Negative.   Genitourinary: Negative.   Musculoskeletal: Negative.        Small knot on left hand  Skin: Negative.   Allergic/Immunologic: Negative.   Neurological: Negative.   Hematological: Negative.   Psychiatric/Behavioral: Negative.        Objective:   Physical Exam  Constitutional: He is oriented to person, place, and time. He appears well-developed and well-nourished. No distress.  The patient is pleasant and alert and feeling well and looks well.  HENT:  Head: Normocephalic and atraumatic.  Right Ear: External ear normal.  Left Ear: External ear normal.  Nose: Nose normal.  Mouth/Throat: Oropharynx is clear and moist. No oropharyngeal exudate.  Eyes: Pupils are  equal, round, and reactive to light. Conjunctivae and EOM are normal. Right eye exhibits no discharge. Left eye exhibits no discharge. No scleral icterus.  Up-to-date on eye exams  Neck: Normal range of motion. Neck supple. No tracheal deviation present. No thyromegaly present.  No bruits thyromegaly or anterior cervical adenopathy.  He appears to have fairly good mobility following his cervical spine surgery.  Cardiovascular: Normal rate, regular rhythm, normal heart sounds and intact distal pulses.  No murmur heard. Heart is regular at 60/min with good pedal pulses  Pulmonary/Chest: Effort normal and breath sounds normal. He has no wheezes. He has no rales. He exhibits no tenderness.  Clear anteriorly and posteriorly and no axillary adenopathy  Abdominal: Soft. Bowel sounds are normal. He exhibits no mass. There is no tenderness. There is no rebound and no guarding.  No liver or spleen enlargement.  No epigastric tenderness.  No inguinal adenopathy suprapubic tenderness masses or bruits.  Genitourinary:  Genitourinary Comments: She is no longer being checked by the urologist.  He still has nocturia 2-3 times.  Musculoskeletal: Normal range of motion. He exhibits no edema or tenderness.  Good range of motion with back and neck.  Lymphadenopathy:    He has no cervical adenopathy.  Neurological: He is alert and oriented to person, place, and time. He has normal reflexes. No cranial nerve deficit.  Skin: Skin is warm and dry. No rash noted.  Psychiatric: He has a normal mood and affect. His behavior is normal. Judgment and thought content normal.  The patient's mood affect and behavior are all normal for him.  Nursing note and vitals reviewed.  BP (!) 112/59 (BP Location: Left Arm)   Pulse (!) 59   Temp (!) 96.6 F (35.9 C) (Oral)   Ht 5\' 10"  (1.778 m)   Wt 172 lb (78 kg)   BMI 24.68 kg/m         Assessment & Plan:  1. Hyperlipidemia, unspecified hyperlipidemia type -All  cholesterol numbers were excellent he will continue with current treatment and therapeutic lifestyle changes  2. Gilberts syndrome -The total bilirubin was normal this time.  3. Vitamin D deficiency -Continue with vitamin D replacement  4. Carotid atherosclerosis, unspecified laterality -Continue with aggressive therapeutic lifestyle changes aspirin and statin therapy  5. Bee sting allergy - EPINEPHrine 0.3 mg/0.3 mL IJ SOAJ injection; Inject 0.3 mLs (0.3 mg total) into the muscle once for 1 dose.  Dispense: 1 Device; Refill: 3  6. Chronic bilateral low back pain with sciatica, sciatica laterality unspecified -Take Tylenol for pain and consider MG 12  Meds ordered this encounter  Medications  . EPINEPHrine 0.3 mg/0.3 mL IJ SOAJ injection    Sig: Inject 0.3 mLs (0.3 mg total) into the muscle once for 1 dose.    Dispense:  1 Device    Refill:  3   Patient Instructions  Medicare Annual Wellness Visit  [facility] and the medical providers at [facility] strive to bring you the best medical care.  In doing so we not only want to address your current medical conditions and concerns but also to detect new conditions early and prevent illness, disease and health-related problems.    Medicare offers a yearly Wellness Visit which allows our clinical staff to assess your need for preventative services including immunizations, lifestyle education, counseling to decrease risk of preventable diseases and screening for fall risk and other medical concerns.    This visit is provided free of charge (no copay) for all Medicare recipients. The clinical pharmacists at [facility] have begun to conduct these Wellness Visits which will also include a thorough review of all your medications.    As you primary medical provider recommend that you make an appointment for your Annual Wellness Visit if you have not done so already this year.   You may set up this appointment before you leave today or you may call back ([phone]) and schedule an appointment.  Please make sure when you call that you mention that you are scheduling your Annual Wellness Visit with the clinical pharmacist so that the appointment may be made for the proper length of time.     Continue current medications. Continue good therapeutic lifestyle changes which include good diet and exercise. Fall precautions discussed with patient. If an FOBT was given today- please return it to our front desk. If you are over 67 years old - you may need Prevnar 20 or the adult Pneumonia vaccine.  **Flu shots are available--- please call and schedule a FLU-CLINIC appointment**  After your visit with [place] today you will receive a survey in the mail or online from [employer] regarding your care with [place]. Please take a moment to fill this out. Your feedback is very important to [place] as you can help [place] better understand your patient needs as well as improve your experience and satisfaction. WE CARE ABOUT YOU!!!   Continue to stay active physically. Drink plenty of water Avoid climbing if possible to prevent falls Consider trying MG 12--this can be purchased at [facility]  Arrie Senate MD

## 2018-02-19 ENCOUNTER — Telehealth: Payer: Self-pay | Admitting: Family Medicine

## 2018-02-19 MED ORDER — EPINEPHRINE 0.3 MG/0.3ML IJ SOAJ: 0.3000 mg | Freq: Once | INTRAMUSCULAR | 11 refills | Status: AC

## 2018-02-19 NOTE — Telephone Encounter (Signed)
pt aware - printed and up front

## 2018-04-20 ENCOUNTER — Other Ambulatory Visit: Payer: Self-pay | Admitting: Family Medicine

## 2018-05-15 ENCOUNTER — Encounter: Payer: Self-pay | Admitting: Family Medicine

## 2018-05-15 DIAGNOSIS — L821 Other seborrheic keratosis: Secondary | ICD-10-CM | POA: Diagnosis not present

## 2018-05-15 DIAGNOSIS — B078 Other viral warts: Secondary | ICD-10-CM | POA: Diagnosis not present

## 2018-05-15 DIAGNOSIS — M6740 Ganglion, unspecified site: Secondary | ICD-10-CM | POA: Diagnosis not present

## 2018-05-15 DIAGNOSIS — Z85828 Personal history of other malignant neoplasm of skin: Secondary | ICD-10-CM | POA: Diagnosis not present

## 2018-05-15 DIAGNOSIS — L820 Inflamed seborrheic keratosis: Secondary | ICD-10-CM | POA: Diagnosis not present

## 2018-05-15 DIAGNOSIS — D225 Melanocytic nevi of trunk: Secondary | ICD-10-CM | POA: Diagnosis not present

## 2018-05-15 DIAGNOSIS — D1801 Hemangioma of skin and subcutaneous tissue: Secondary | ICD-10-CM | POA: Diagnosis not present

## 2018-05-15 DIAGNOSIS — L570 Actinic keratosis: Secondary | ICD-10-CM | POA: Diagnosis not present

## 2018-05-15 DIAGNOSIS — C44519 Basal cell carcinoma of skin of other part of trunk: Secondary | ICD-10-CM | POA: Diagnosis not present

## 2018-05-22 DIAGNOSIS — R2232 Localized swelling, mass and lump, left upper limb: Secondary | ICD-10-CM | POA: Diagnosis not present

## 2018-05-22 DIAGNOSIS — M7989 Other specified soft tissue disorders: Secondary | ICD-10-CM | POA: Insufficient documentation

## 2018-05-22 DIAGNOSIS — M65321 Trigger finger, right index finger: Secondary | ICD-10-CM | POA: Diagnosis not present

## 2018-08-14 ENCOUNTER — Ambulatory Visit (INDEPENDENT_AMBULATORY_CARE_PROVIDER_SITE_OTHER): Payer: Medicare Other | Admitting: Family Medicine

## 2018-08-14 ENCOUNTER — Other Ambulatory Visit: Payer: Self-pay

## 2018-08-14 DIAGNOSIS — G8929 Other chronic pain: Secondary | ICD-10-CM | POA: Diagnosis not present

## 2018-08-14 DIAGNOSIS — E559 Vitamin D deficiency, unspecified: Secondary | ICD-10-CM

## 2018-08-14 DIAGNOSIS — M8949 Other hypertrophic osteoarthropathy, multiple sites: Secondary | ICD-10-CM

## 2018-08-14 DIAGNOSIS — M159 Polyosteoarthritis, unspecified: Secondary | ICD-10-CM

## 2018-08-14 DIAGNOSIS — M150 Primary generalized (osteo)arthritis: Secondary | ICD-10-CM

## 2018-08-14 DIAGNOSIS — E785 Hyperlipidemia, unspecified: Secondary | ICD-10-CM

## 2018-08-14 DIAGNOSIS — M5440 Lumbago with sciatica, unspecified side: Secondary | ICD-10-CM

## 2018-08-14 DIAGNOSIS — I6529 Occlusion and stenosis of unspecified carotid artery: Secondary | ICD-10-CM

## 2018-08-14 DIAGNOSIS — R972 Elevated prostate specific antigen [PSA]: Secondary | ICD-10-CM

## 2018-08-14 NOTE — Patient Instructions (Addendum)
The patient should continue to walk regularly and exercise regularly as much as possible while keeping his distance from other people. He should continue to practice good hand and respiratory hygiene and wear facial mask when out in public. He should drink plenty of fluids and stay well-hydrated He is due to get lab work and he should come by the office in the next 4 to 6 weeks to get this lab work completed but he should call and arrange this visit with my nurse before coming so that an order will be in place.

## 2018-08-14 NOTE — Progress Notes (Signed)
Virtual Visit Via telephone Note I connected with@ on 08/14/18 by telephone and verified that I am speaking with the correct person or authorized healthcare agent using two identifiers. [name] is currently located at home and there are no unauthorized people in close proximity. I completed this visit while in a private location in my home .  I connected to the patient by telephone and verified that I was speaking with the correct person.  This visit type was conducted due to national recommendations for restrictions regarding the COVID-19 Pandemic (e.g. social distancing).  This format is felt to be most appropriate for this patient at this time.  All issues noted in this document were discussed and addressed.  No physical exam was performed.    I discussed the limitations, risks, security and privacy concerns of performing an evaluation and management service by telephone and the availability of in person appointments. I also discussed with the patient that there may be a patient responsible charge related to this service. The patient expressed understanding and agreed to proceed.   Date:  08/14/2018    ID:  Aalijah Lanphere      08/30/1932        [id number]   Patient Care Team Patient Care Team: [name], MD as PCP - General (Family Medicine) Carolan Clines, MD (Inactive) as Consulting Physician (Urology) Alphonsa Overall, MD as Consulting Physician (General Surgery)  Reason for Visit: Primary Care Follow-up     History of Present Illness & Review of Systems:     Jarett Dralle. is a 85 y.o. year old male primary care patient that presents today for a telehealth visit.  I have known this patient for a good while.  He has been fairly healthy for his age.  He has had cervical spine surgery for degenerative disc disease with nerve impingement.  He is doing well following the surgery.  He also has carotid artery disease and is currently taking a statin drug to  reduce progression of this.  He generally stays fairly active physically.  Has been restricted recently because of coronavirus.  He is still walking and playing some golf but wearing protective equipment and washing his hands regularly and using gloves.  Today he denies any chest pain pressure tightness or shortness of breath.  He denies any problems with his stomach including nausea vomiting diarrhea blood in the stool black tarry bowel movements.  He is passing his water well.  He still has some stiffness in his neck but is doing much better than he was since he had the surgery.  He has some back problems off and on.  His allergies and medications are unchanged.  He would like to wait for 6 months to get his next blood work and this would be fine with me.  He understands to call us if he needs anything sooner.  Review of systems as stated otherwise negative for body systems left unmentioned.   The patient does not have symptoms concerning for COVID-19 infection (fever, chills, cough, or new shortness of breath).      Current Medications (Verified) Allergies as of 08/14/2018      Reactions   Bee Venom Swelling   SWELLING REACTION UNSPECIFIED       Medication List       Accurate as of August 14, 2018  9:23 AM. Always use your most recent med list.        aspirin  EC 81 MG tablet Take 81 mg by mouth every evening.   cholecalciferol 1000 units tablet Commonly known as:  VITAMIN D Take 2,000 Units by mouth daily.   Coenzyme Q10 200 MG capsule Take 200 mg by mouth 3 (three) times a week.   Flax Seed Oil 1000 MG Caps Take 1,000 mg by mouth every evening.   Glucosamine-Chondroitin-Vit D3 1500-1200-800 MG-MG-UNIT Pack Take 1 tablet by mouth every evening.   rosuvastatin 5 MG tablet Commonly known as:  CRESTOR TAKE ONE TABLET BY MOUTH AT BEDTIME           Allergies (Verified)    Bee venom  Past Medical History Past Medical History:  Diagnosis Date  . Arthritis   . BPH (benign  prostatic hyperplasia)   . Elevated PSA   . Erectile dysfunction   . Gilberts syndrome    elevated bile on liver tests in past  . Hyperlipidemia   . Inguinal hernia recurrent unilateral   . Nocturia   . Osteoarthrosis and allied disorders   . Other and unspecified hyperlipidemia   . Rotator cuff tear, left    partial     Past Surgical History:  Procedure Laterality Date  . CARPAL TUNNEL RELEASE Left 06/27/2013   Procedure: LEFT CARPAL TUNNEL RELEASE;  Surgeon: Cammie Sickle., MD;  Location: [facility];  Service: Orthopedics;  Laterality: Left;  . CATARACT EXTRACTION, BILATERAL  06/03/2015  . EYE SURGERY    . JOINT REPLACEMENT Left    knee  . KNEE ARTHROSCOPY     bilateral  . POSTERIOR CERVICAL FUSION/FORAMINOTOMY N/A 11/30/2016   Procedure: Cervical one-Cervical two Posterior Instrumentation and Fusion;  Surgeon: Newman Pies, MD;  Location: [facility];  Service: Neurosurgery;  Laterality: N/A;  . right hand  1987   tendon repair   . TONSILLECTOMY     as child  . TOTAL KNEE ARTHROPLASTY  05/16/2012   Procedure: TOTAL KNEE ARTHROPLASTY;  Surgeon: Tobi Bastos, MD;  Location: [facility];  Service: Orthopedics;  Laterality: Left;  . TRANSURETHRAL RESECTION OF PROSTATE N/A 05/23/2013   Procedure: TRANSURETHRAL RESECTION OF THE PROSTATE WITH GYRUS INSTRUMENTS;  Surgeon: Ailene Rud, MD;  Location: [facility];  Service: Urology;  Laterality: N/A;    Social History   Socioeconomic History  . Marital status: Married    Spouse name: Not on file  . Number of children: Not on file  . Years of education: Not on file  . Highest education level: Not on file  Occupational History  . Not on file  Social Needs  . Financial resource strain: Not on file  . Food insecurity:    Worry: Not on file    Inability: Not on file  . Transportation needs:    Medical: Not on file    Non-medical: Not on file  Tobacco Use  . Smoking status: Former Smoker    Types: Pipe     Last attempt to quit: 10/05/1982    Years since quitting: 35.8  . Smokeless tobacco: Never Used  Substance and Sexual Activity  . Alcohol use: Yes    Alcohol/week: 1.0 standard drinks    Types: 1 Cans of beer per week    Comment: 1 beer qod, 1 glass wine 2x week  . Drug use: No  . Sexual activity: Yes  Lifestyle  . Physical activity:    Days per week: Not on file    Minutes per session: Not on file  . Stress: Not  on file  Relationships  . Social connections:    Talks on phone: Not on file    Gets together: Not on file    Attends religious service: Not on file    Active member of club or organization: Not on file    Attends meetings of clubs or organizations: Not on file    Relationship status: Not on file  Other Topics Concern  . 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       Family History  Problem Relation Age of Onset  . COPD Mother   . Heart disease Father   . Heart attack Father 38       Died age 53  . Cancer Maternal Grandfather   . Heart attack Paternal Grandfather   . Hypertension Sister   . Hypertension Sister       Labs/Other Tests and Data Reviewed:    Wt Readings from Last 3 Encounters:  02/14/18 172 lb (78 kg)  10/05/17 170 lb (77.1 kg)  08/15/17 170 lb (77.1 kg)   Temp Readings from Last 3 Encounters:  02/14/18 (!) 96.6 F (35.9 C) (Oral)  08/15/17 (!) 96.6 F (35.9 C) (Oral)  02/15/17 (!) 97.1 F (36.2 C) (Oral)   BP Readings from Last 3 Encounters:  02/14/18 (!) 112/59  10/05/17 118/66  08/15/17 97/60   Pulse Readings from Last 3 Encounters:  02/14/18 (!) 59  10/05/17 (!) 56  08/15/17 60     No results found for: HGBA1C Lab Results  Component Value Date   LDLCALC 68 02/01/2018   CREATININE 0.79 02/01/2018       Chemistry      Component Value Date/Time   NA 142 02/01/2018 1716   K 4.2 02/01/2018 1716   CL 101 02/01/2018 1716   CO2 26 02/01/2018 1716   BUN 17 02/01/2018 1716    CREATININE 0.79 02/01/2018 1716   CREATININE 0.77 09/11/2012 0931      Component Value Date/Time   CALCIUM 9.4 02/01/2018 1716   ALKPHOS 76 02/01/2018 1716   AST 17 02/01/2018 1716   ALT 19 02/01/2018 1716   BILITOT 1.1 02/01/2018 1716         OBSERVATIONS/ OBJECTIVE:     The patient is alert and has no specific complaints today.  He sounds good and is upbeat other than his restrictive movements and not being able to exercise as much as he would like.  He has no specific complaints other than the generalized arthritic symptoms and continues to take his Crestor 3 times weekly because of the carotid artery disease that has been noted.  He will not get any lab work done until prior to his next visit.  Physical exam deferred due to nature of telephonic visit.  ASSESSMENT & PLAN    Time:   Today, I have spent 23 minutes with the patient via telephone discussing the above including Covid precautions.     Visit Diagnoses: 1. Hyperlipidemia, unspecified hyperlipidemia type -Continue with statin therapy and recheck labs before next visit in 6 months.  2. Gilberts syndrome -This is an ongoing issue and we do check liver function test with each blood draw.  This will be done before his next visit.  3. Carotid atherosclerosis, unspecified laterality -Continue with Crestor 3 times weekly, 5 mg and continue with aggressive therapeutic lifestyle changes as much as possible  4. Vitamin D deficiency -Continue with vitamin D replacement pending results of  lab work  5. Chronic bilateral low back pain with sciatica, sciatica laterality unspecified -Take an occasional Aleve as needed always after eating but not on a regular basis  6. Elevated PSA -Check PSA at next visit  7. Primary osteoarthritis involving multiple joints -He especially complains with his hands bother him at times and he will once again take an Aleve as needed.  Patient Instructions  The patient should continue to walk  regularly and exercise regularly as much as possible while keeping his distance from other people. He should continue to practice good hand and respiratory hygiene and wear facial mask when out in public. He should drink plenty of fluids and stay well-hydrated He is due to get lab work and he should come by the office in the next 4 to 6 weeks to get this lab work completed but he should call and arrange this visit with my nurse before coming so that an order will be in place.       The above assessment and management plan was discussed with the patient. The patient verbalized understanding of and has agreed to the management plan. Patient is aware to call the clinic if symptoms persist or worsen. Patient is aware when to return to the clinic for a follow-up visit. Patient educated on when it is appropriate to go to the emergency department.    [name], MD [address], [place], [place] [address] Ph [phone]   Arrie Senate MD

## 2018-10-03 ENCOUNTER — Other Ambulatory Visit: Payer: Self-pay

## 2018-10-03 ENCOUNTER — Ambulatory Visit (INDEPENDENT_AMBULATORY_CARE_PROVIDER_SITE_OTHER): Payer: Medicare Other | Admitting: *Deleted

## 2018-10-03 DIAGNOSIS — Z0000 Encounter for general adult medical examination without abnormal findings: Secondary | ICD-10-CM | POA: Diagnosis not present

## 2018-10-03 NOTE — Patient Instructions (Signed)
Preventive Care 2 Years and Older, Male Preventive care refers to lifestyle choices and visits with your health care provider that can promote health and wellness. What does preventive care include?   A yearly physical exam. This is also called an annual well check.  Dental exams once or twice a year.  Routine eye exams. Ask your health care provider how often you should have your eyes checked.  Personal lifestyle choices, including: ? Daily care of your teeth and gums. ? Regular physical activity. ? Eating a healthy diet. ? Avoiding tobacco and drug use. ? Limiting alcohol use. ? Practicing safe sex. ? Taking low doses of aspirin every day. ? Taking vitamin and mineral supplements as recommended by your health care provider. What happens during an annual well check? The services and screenings done by your health care provider during your annual well check will depend on your age, overall health, lifestyle risk factors, and family history of disease. Counseling Your health care provider may ask you questions about your:  Alcohol use.  Tobacco use.  Drug use.  Emotional well-being.  Home and relationship well-being.  Sexual activity.  Eating habits.  History of falls.  Memory and ability to understand (cognition).  Work and work Statistician. Screening You may have the following tests or measurements:  Height, weight, and BMI.  Blood pressure.  Lipid and cholesterol levels. These may be checked every 5 years, or more frequently if you are over 9 years old.  Skin check.  Lung cancer screening. You may have this screening every year starting at age 57 if you have a 30-pack-year history of smoking and currently smoke or have quit within the past 15 years.  Colorectal cancer screening. All adults should have this screening starting at age 90 and continuing until age 69. You will have tests every 1-10 years, depending on your results and the type of screening  test. People at increased risk should start screening at an earlier age. Screening tests may include: ? Guaiac-based fecal occult blood testing. ? Fecal immunochemical test (FIT). ? Stool DNA test. ? Virtual colonoscopy. ? Sigmoidoscopy. During this test, a flexible tube with a tiny camera (sigmoidoscope) is used to examine your rectum and lower colon. The sigmoidoscope is inserted through your anus into your rectum and lower colon. ? Colonoscopy. During this test, a long, thin, flexible tube with a tiny camera (colonoscope) is used to examine your entire colon and rectum.  Prostate cancer screening. Recommendations will vary depending on your family history and other risks.  Hepatitis C blood test.  Hepatitis B blood test.  Sexually transmitted disease (STD) testing.  Diabetes screening. This is done by checking your blood sugar (glucose) after you have not eaten for a while (fasting). You may have this done every 1-3 years.  Abdominal aortic aneurysm (AAA) screening. You may need this if you are a current or former smoker.  Osteoporosis. You may be screened starting at age 30 if you are at high risk. Talk with your health care provider about your test results, treatment options, and if necessary, the need for more tests. Vaccines Your health care provider may recommend certain vaccines, such as:  Influenza vaccine. This is recommended every year.  Tetanus, diphtheria, and acellular pertussis (Tdap, Td) vaccine. You may need a Td booster every 10 years.  Varicella vaccine. You may need this if you have not been vaccinated.  Zoster vaccine. You may need this after age 42.  Measles, mumps, and rubella (MMR) vaccine.  You may need at least one dose of MMR if you were born in 1957 or later. You may also need a second dose.  Pneumococcal 13-valent conjugate (PCV13) vaccine. One dose is recommended after age 65.  Pneumococcal polysaccharide (PPSV23) vaccine. One dose is recommended  after age 65.  Meningococcal vaccine. You may need this if you have certain conditions.  Hepatitis A vaccine. You may need this if you have certain conditions or if you travel or work in places where you may be exposed to hepatitis A.  Hepatitis B vaccine. You may need this if you have certain conditions or if you travel or work in places where you may be exposed to hepatitis B.  Haemophilus influenzae type b (Hib) vaccine. You may need this if you have certain risk factors. Talk to your health care provider about which screenings and vaccines you need and how often you need them. This information is not intended to replace advice given to you by your health care provider. Make sure you discuss any questions you have with your health care provider. Document Released: 05/08/2015 Document Revised: 06/01/2017 Document Reviewed: 02/10/2015 Elsevier Interactive Patient Education  2019 Elsevier Inc.  

## 2018-10-03 NOTE — Progress Notes (Signed)
MEDICARE ANNUAL WELLNESS VISIT  10/03/2018  Telephone Visit Disclaimer This Medicare AWV was conducted by telephone due to national recommendations for restrictions regarding the COVID-19 Pandemic (e.g. social distancing).  I verified, using two identifiers, that I am speaking with Cameron [name]. or their authorized healthcare agent. I discussed the limitations, risks, security, and privacy concerns of performing an evaluation and management service by telephone and the potential availability of an in-person appointment in the future. The patient expressed understanding and agreed to proceed.   Subjective:  Cameron [name]. is a 85 y.o. male patient of [name], MD who had a Medicare Annual Wellness Visit today via telephone. Dominyck is Retired and lives with their spouse. he has 4 children. he reports that he is socially active and does interact with friends/family regularly. he is moderately physically active and enjoys playing golf at least twice weekly.  Patient Care Team: [name], MD as PCP - General (Family Medicine) Carolan Clines, MD (Inactive) as Consulting Physician (Urology) Alphonsa Overall, MD as Consulting Physician (General Surgery)  Advanced Directives 10/03/2018 07/19/2017 12/01/2016 11/17/2016 09/02/2014 06/24/2013 05/23/2013  Does Patient Have a Medical Advance Directive? Yes Yes Yes Yes Yes Patient has advance directive, copy not in chart Patient does not have advance directive  Type of Advance Directive [place];Living will - [place];Living will [place];Living will [place];Living will [place];Living will [place];Living will  Does patient want to make changes to medical advance directive? No - Patient declined - No - Patient declined No - Patient declined - - No  Copy of Healthcare Power of Attorney in Chart? No - copy requested - - No -  copy requested No - copy requested - Copy requested from family  Pre-existing out of facility DNR order (yellow form or pink MOST form) - - - - - - No    Hospital Utilization Over the Past 12 Months: # of hospitalizations or ER visits: 0 # of surgeries: 0  Review of Systems    Patient reports that his overall health is unchanged compared to last year.  Patient Reported Readings (BP, Pulse, CBG, Weight, etc) none  Review of Systems: No complaints  All other systems negative.  Pain Assessment Pain : No/denies pain     Current Medications & Allergies (verified) Allergies as of 10/03/2018      Reactions   Bee Venom Swelling   SWELLING REACTION UNSPECIFIED       Medication List       Accurate as of October 03, 2018  1:41 PM. If you have any questions, ask your nurse or doctor.        aspirin EC 81 MG tablet Take 81 mg by mouth every evening.   cholecalciferol 1000 units tablet Commonly known as:  VITAMIN D Take 2,000 Units by mouth daily.   Coenzyme Q10 200 MG capsule Take 200 mg by mouth 3 (three) times a week.   Flax Seed Oil 1000 MG Caps Take 1,000 mg by mouth every evening.   Glucosamine-Chondroitin-Vit D3 1500-1200-800 MG-MG-UNIT Pack Take 1 tablet by mouth every evening.   rosuvastatin 5 MG tablet Commonly known as:  CRESTOR TAKE ONE TABLET BY MOUTH AT BEDTIME       History (reviewed): Past Medical History:  Diagnosis Date  . Arthritis   . BPH (benign prostatic hyperplasia)   . Elevated PSA   . Erectile  dysfunction   . Gilberts syndrome    elevated bile on liver tests in past  . Hyperlipidemia   . Inguinal hernia recurrent unilateral   . Nocturia   . Osteoarthrosis and allied disorders   . Other and unspecified hyperlipidemia   . Rotator cuff tear, left    partial   Past Surgical History:  Procedure Laterality Date  . CARPAL TUNNEL RELEASE Left 06/27/2013   Procedure: LEFT CARPAL TUNNEL RELEASE;  Surgeon: Cammie Sickle., MD;  Location:  [facility];  Service: Orthopedics;  Laterality: Left;  . CATARACT EXTRACTION, BILATERAL  06/03/2015  . EYE SURGERY    . JOINT REPLACEMENT Left    knee  . KNEE ARTHROSCOPY     bilateral  . POSTERIOR CERVICAL FUSION/FORAMINOTOMY N/A 11/30/2016   Procedure: Cervical one-Cervical two Posterior Instrumentation and Fusion;  Surgeon: Newman Pies, MD;  Location: [facility];  Service: Neurosurgery;  Laterality: N/A;  . right hand  1987   tendon repair   . TONSILLECTOMY     as child  . TOTAL KNEE ARTHROPLASTY  05/16/2012   Procedure: TOTAL KNEE ARTHROPLASTY;  Surgeon: Tobi Bastos, MD;  Location: [facility];  Service: Orthopedics;  Laterality: Left;  . TRANSURETHRAL RESECTION OF PROSTATE N/A 05/23/2013   Procedure: TRANSURETHRAL RESECTION OF THE PROSTATE WITH GYRUS INSTRUMENTS;  Surgeon: Ailene Rud, MD;  Location: [facility];  Service: Urology;  Laterality: N/A;   Family History  Problem Relation Age of Onset  . COPD Mother   . Heart disease Father   . Heart attack Father 9       Died age 10  . Cancer Maternal Grandfather   . Heart attack Paternal Grandfather   . Hypertension Sister   . Hypertension Sister    Social History   Socioeconomic History  . Marital status: Married    Spouse name: [name]  . Number of children: 4  . Years of education: 16  . Highest education level: Bachelor's degree (e.g., BA, AB, BS)  Occupational History  . Occupation: Retired  Scientific laboratory technician  . Financial resource strain: Not hard at all  . Food insecurity:    Worry: Never true    Inability: Never true  . Transportation needs:    Medical: No    Non-medical: No  Tobacco Use  . Smoking status: Former Smoker    Types: Pipe    Last attempt to quit: 10/05/1982    Years since quitting: 36.0  . Smokeless tobacco: Never Used  Substance and Sexual Activity  . Alcohol use: Yes    Alcohol/week: 1.0 standard drinks    Types: 1 Cans of beer per week    Comment: 1 beer qod, 1 glass wine 2x  week  . Drug use: No  . Sexual activity: Yes    Birth control/protection: None  Lifestyle  . Physical activity:    Days per week: 7 days    Minutes per session: 40 min  . Stress: Not at all  Relationships  . Social connections:    Talks on phone: More than three times a week    Gets together: Twice a week    Attends religious service: Never    Active member of club or organization: No    Attends meetings of clubs or organizations: Never    Relationship status: Married  Other Topics Concern  . 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  Activities of Daily Living In your present state of health, do you have any difficulty performing the following activities: 10/03/2018  Hearing? N  Vision? N  Difficulty concentrating or making decisions? N  Walking or climbing stairs? N  Dressing or bathing? N  Doing errands, shopping? N  Preparing Food and eating ? N  Using the Toilet? N  In the past six months, have you accidently leaked urine? N  Do you have problems with loss of bowel control? N  Managing your Medications? N  Managing your Finances? N  Housekeeping or managing your Housekeeping? N  Some recent data might be hidden    Patient Literacy How often do you need to have someone help you when you read instructions, pamphlets, or other written materials from your doctor or pharmacy?: 1 - Never What is the last grade level you completed in school?: Bachelors Degree-Engineering  Exercise Current Exercise Habits: Home exercise routine, Type of exercise: walking, Time (Minutes): 40, Frequency (Times/Week): 7, Weekly Exercise (Minutes/Week): 280, Intensity: Moderate, Exercise limited by: None identified  Diet Patient reports consuming 3 meals a day and 0 snack(s) a day Patient reports that his primary diet is: Regular Patient reports that she does have regular access to food.   Depression Screen PHQ 2/9 Scores 10/03/2018  02/14/2018 08/15/2017 02/15/2017 10/11/2016 10/03/2016 08/02/2016  PHQ - 2 Score 0 0 0 0 0 1 0     Fall Risk Fall Risk  10/03/2018 02/14/2018 08/15/2017 02/15/2017 10/11/2016  Falls in the past year? 0 No No No No  Risk for fall due to : - - - - -     Objective:  Cameron [name]. seemed alert and oriented and he participated appropriately during our telephone visit.  Blood Pressure Weight BMI  BP Readings from Last 3 Encounters:  02/14/18 (!) 112/59  10/05/17 118/66  08/15/17 97/60   Wt Readings from Last 3 Encounters:  02/14/18 172 lb (78 kg)  10/05/17 170 lb (77.1 kg)  08/15/17 170 lb (77.1 kg)   BMI Readings from Last 1 Encounters:  02/14/18 24.68 kg/m    *Unable to obtain current vital signs, weight, and BMI due to telephone visit type  Hearing/Vision  . [place] did not seem to have difficulty with hearing/understanding during the telephone conversation . Reports that he has not had a formal eye exam by an eye care professional within the past year . Reports that he has not had a formal hearing evaluation within the past year *Unable to fully assess hearing and vision during telephone visit type  Cognitive Function: 6CIT Screen 10/03/2018  What Year? 0 points  What month? 0 points  What time? 0 points  Count back from 20 0 points  Months in reverse 0 points  Repeat phrase 0 points  Total Score 0    Normal Cognitive Function Screening: Yes (Normal:0-7, Significant for Dysfunction: >8)  Immunization & Health Maintenance Record Immunization History  Administered Date(s) Administered  . Influenza Split 01/31/2013  . Influenza Whole 12/24/2009  . Influenza, High Dose Seasonal PF 03/01/2016, 02/01/2017, 01/19/2018  . Influenza,inj,Quad PF,6+ Mos 02/13/2014, 01/21/2015  . PPD Test 01/14/2014  . Pneumococcal Conjugate-13 04/11/2013  . Pneumococcal Polysaccharide-23 04/25/2004  . Tdap 12/24/2009  . Zoster 01/24/2007  . Zoster Recombinat (Shingrix) 03/06/2017, 01/29/2018     Health Maintenance  Topic Date Due  . INFLUENZA VACCINE  11/24/2018  . TETANUS/TDAP  12/25/2019  . PNA vac Low Risk Adult  Completed  Assessment  This is a routine wellness examination for [employer].Marland Kitchen  Health Maintenance: Due or Overdue There are no preventive care reminders to display for this patient.  Cameron [name]. does not need a referral for Community Assistance: Care Management:   no Social Work:    no Prescription Assistance:  no Nutrition/Diabetes Education:  no   Plan:  Personalized Goals Goals Addressed            This Visit's Progress   . Patient Stated (pt-stated)       " I want to get back in our exercise room and pool here in the community after the virus is over"      Personalized Health Maintenance & Screening Recommendations  pt is up to date on all Health Maintenance Recommendations  Lung Cancer Screening Recommended: no (Low Dose CT Chest recommended if Age 35-80 years, 30 pack-year currently smoking OR have quit w/in past 15 years) Hepatitis C Screening recommended: no HIV Screening recommended: no  Advanced Directives: Written information was not prepared per patient's request.  Referrals & Orders No orders of the defined types were placed in this encounter.   Follow-up Plan . Follow-up with [name], MD as planned     I have personally reviewed and noted the following in the patient's chart:   . Medical and social history . Use of alcohol, tobacco or illicit drugs  . Current medications and supplements . Functional ability and status . Nutritional status . Physical activity . Advanced directives . List of other physicians . Hospitalizations, surgeries, and ER visits in previous 12 months . Vitals . Screenings to include cognitive, depression, and falls . Referrals and appointments  In addition, I have reviewed and discussed with Cameron [name]. certain preventive protocols, quality metrics, and best  practice recommendations. A written personalized care plan for preventive services as well as general preventive health recommendations is available and can be mailed to the patient at his request.      Marylin Crosby  10/03/2018

## 2018-10-10 IMAGING — DX DG CHEST 2V
2 series · 2 of 2 positions shown · non-contrast
Comparison: 08/21/2014

CLINICAL DATA: Hyperlipidemia

EXAM:
CHEST  2 VIEW

[chest pa]
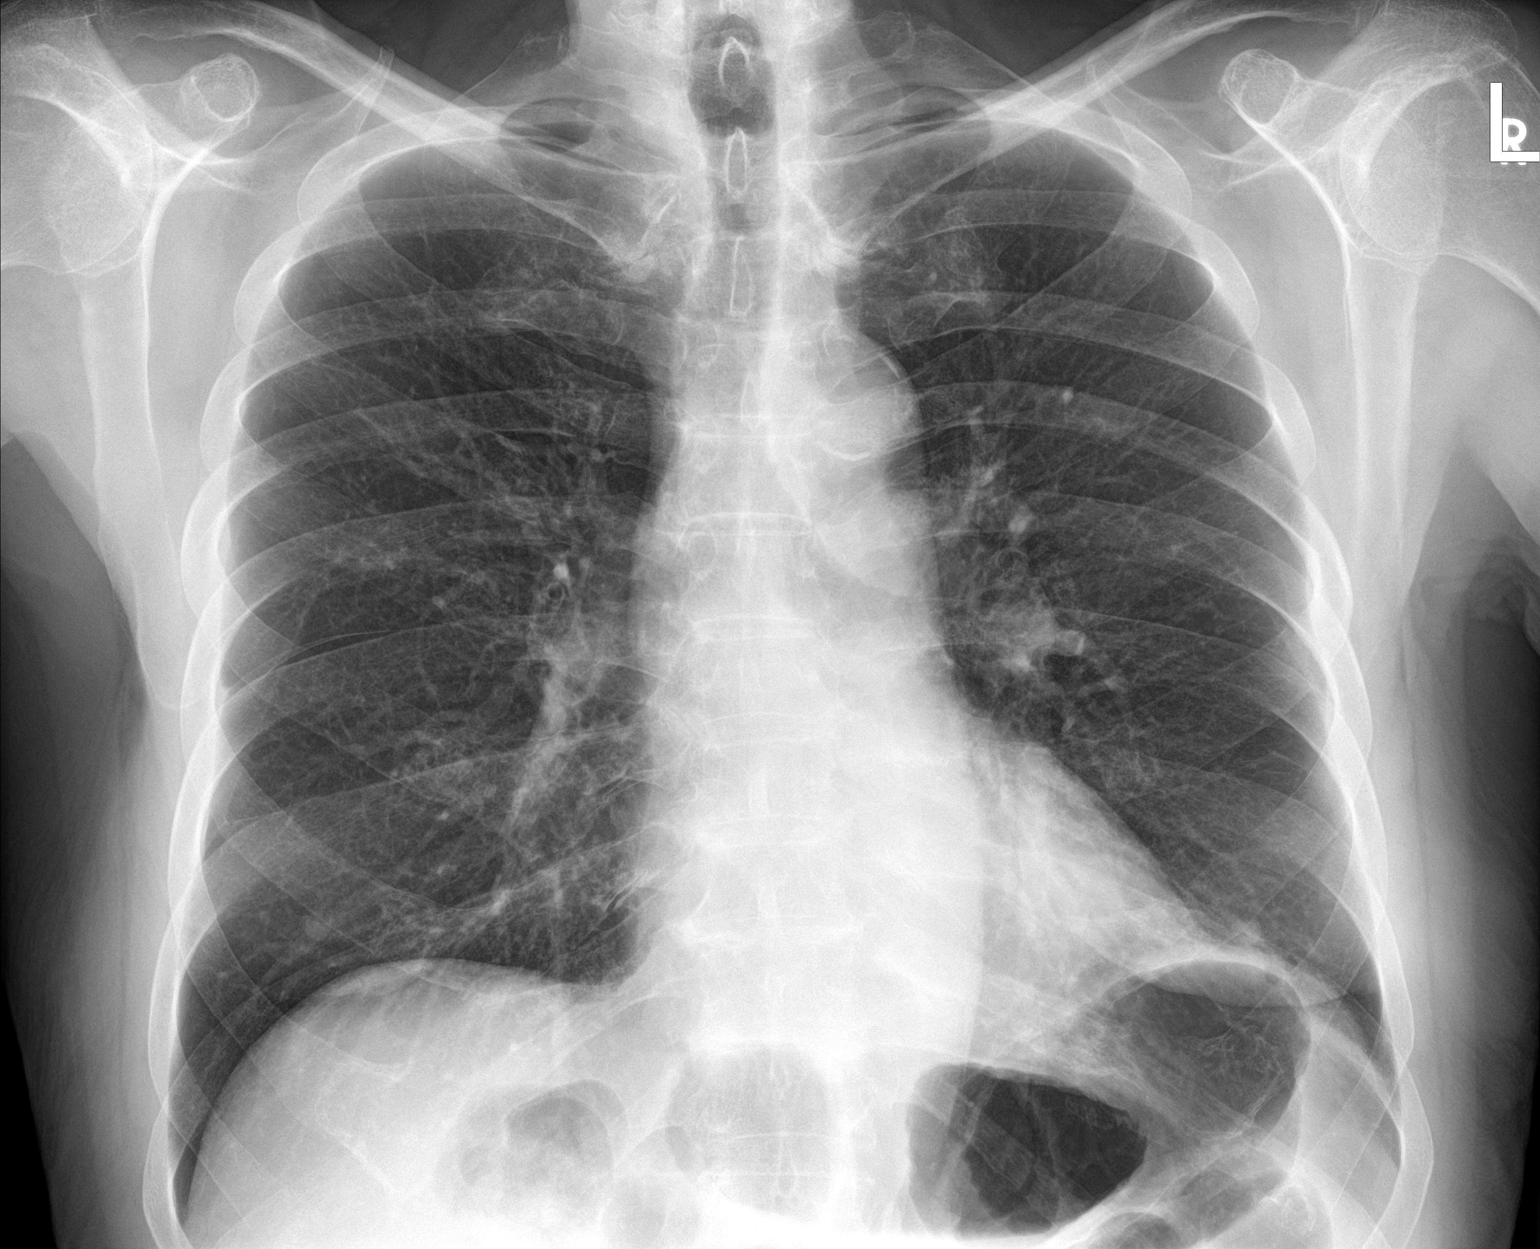

[chest lat]
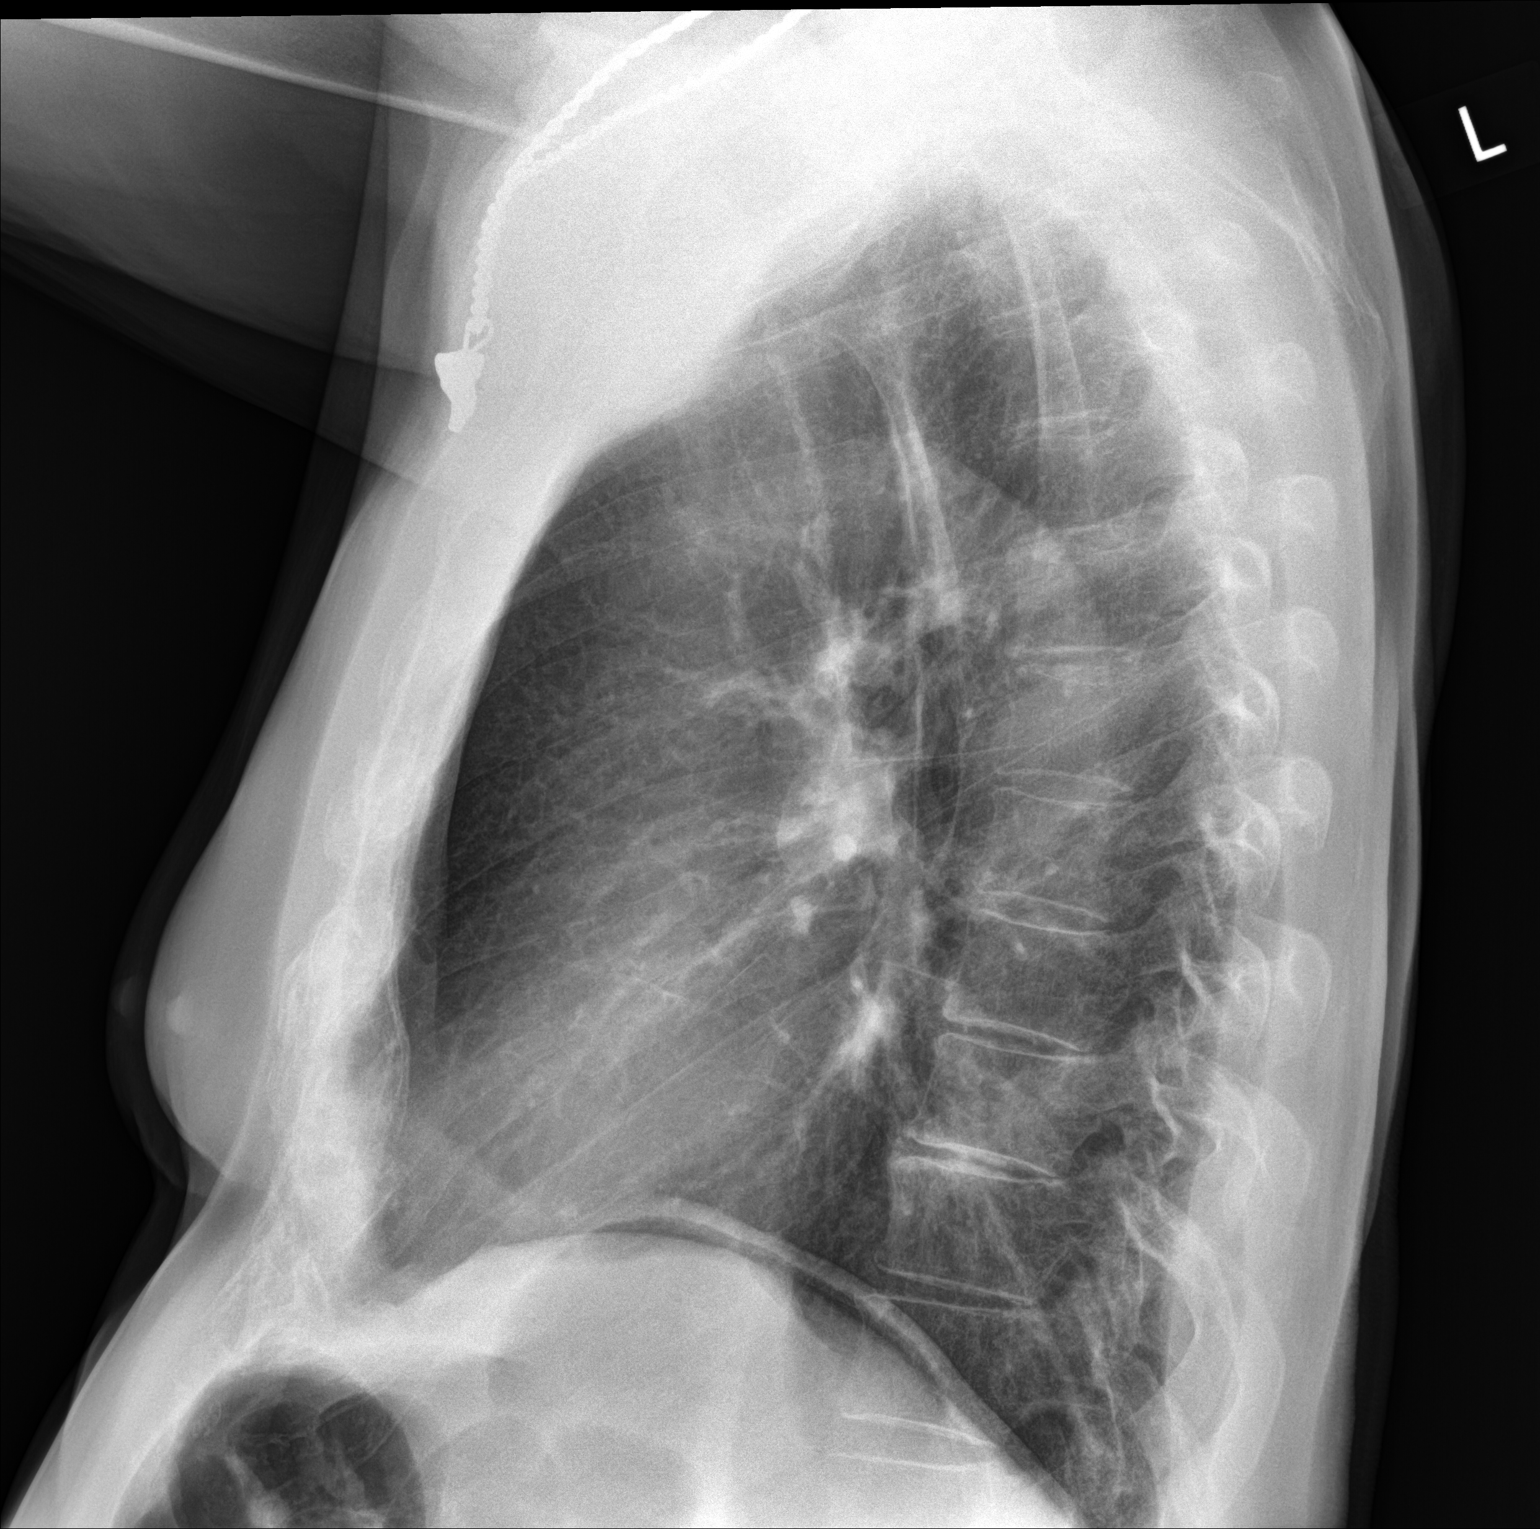

[2 of 2 positions shown; findings below may reference images not displayed]

FINDINGS: Heart size is normal. There is aortic atherosclerosis. The lungs are
clear. Bilateral nipple shadows. No evidence heart failure or
effusion. Ordinary degenerative changes affect the spine.
IMPRESSION: No active disease.  Aortic atherosclerosis.

## 2018-10-26 ENCOUNTER — Other Ambulatory Visit: Payer: Self-pay | Admitting: Family Medicine

## 2018-11-01 ENCOUNTER — Encounter: Payer: Self-pay | Admitting: Family Medicine

## 2018-11-01 DIAGNOSIS — D485 Neoplasm of uncertain behavior of skin: Secondary | ICD-10-CM | POA: Diagnosis not present

## 2018-11-01 DIAGNOSIS — L570 Actinic keratosis: Secondary | ICD-10-CM | POA: Diagnosis not present

## 2018-11-01 DIAGNOSIS — Z85828 Personal history of other malignant neoplasm of skin: Secondary | ICD-10-CM | POA: Diagnosis not present

## 2019-02-15 ENCOUNTER — Other Ambulatory Visit: Payer: Self-pay | Admitting: *Deleted

## 2019-02-15 ENCOUNTER — Telehealth: Payer: Self-pay | Admitting: Family Medicine

## 2019-02-15 DIAGNOSIS — E785 Hyperlipidemia, unspecified: Secondary | ICD-10-CM

## 2019-02-15 DIAGNOSIS — E559 Vitamin D deficiency, unspecified: Secondary | ICD-10-CM

## 2019-02-15 DIAGNOSIS — I6529 Occlusion and stenosis of unspecified carotid artery: Secondary | ICD-10-CM

## 2019-02-15 NOTE — Telephone Encounter (Signed)
Labs placed for pt - aware

## 2019-03-26 ENCOUNTER — Other Ambulatory Visit: Payer: Self-pay

## 2019-03-26 ENCOUNTER — Other Ambulatory Visit: Payer: Medicare Other

## 2019-03-26 DIAGNOSIS — E785 Hyperlipidemia, unspecified: Secondary | ICD-10-CM | POA: Diagnosis not present

## 2019-03-26 DIAGNOSIS — I6529 Occlusion and stenosis of unspecified carotid artery: Secondary | ICD-10-CM | POA: Diagnosis not present

## 2019-03-26 DIAGNOSIS — E559 Vitamin D deficiency, unspecified: Secondary | ICD-10-CM | POA: Diagnosis not present

## 2019-03-27 LAB — CMP14+EGFR
ALT: 19 IU/L (ref 0–44)
AST: 16 IU/L (ref 0–40)
Albumin/Globulin Ratio: 2.3 — ABNORMAL HIGH (ref 1.2–2.2)
Albumin: 4.5 g/dL (ref 3.6–4.6)
Alkaline Phosphatase: 89 IU/L (ref 39–117)
BUN/Creatinine Ratio: 25 — ABNORMAL HIGH (ref 10–24)
BUN: 22 mg/dL (ref 8–27)
Bilirubin Total: 1.4 mg/dL — ABNORMAL HIGH (ref 0.0–1.2)
CO2: 25 mmol/L (ref 20–29)
Calcium: 9.6 mg/dL (ref 8.6–10.2)
Chloride: 103 mmol/L (ref 96–106)
Creatinine, Ser: 0.89 mg/dL (ref 0.76–1.27)
GFR calc Af Amer: 91 mL/min/{1.73_m2} (ref >59)
GFR calc non Af Amer: 79 mL/min/{1.73_m2} (ref >59)
Globulin, Total: 2 g/dL (ref 1.5–4.5)
Glucose: 119 mg/dL — ABNORMAL HIGH (ref 65–99)
Potassium: 4.2 mmol/L (ref 3.5–5.2)
Sodium: 142 mmol/L (ref 134–144)
Total Protein: 6.5 g/dL (ref 6.0–8.5)

## 2019-03-27 LAB — LIPID PANEL
Chol/HDL Ratio: 2.7 ratio (ref 0.0–5.0)
Cholesterol, Total: 136 mg/dL (ref 100–199)
HDL: 51 mg/dL (ref >39)
LDL Chol Calc (NIH): 73 mg/dL (ref 0–99)
Triglycerides: 56 mg/dL (ref 0–149)
VLDL Cholesterol Cal: 12 mg/dL (ref 5–40)

## 2019-03-27 LAB — CBC WITH DIFFERENTIAL/PLATELET
Basophils Absolute: 0.1 10*3/uL (ref 0.0–0.2)
Basos: 1 %
EOS (ABSOLUTE): 0.1 10*3/uL (ref 0.0–0.4)
Eos: 1 %
Hematocrit: 50.6 % (ref 37.5–51.0)
Hemoglobin: 16.7 g/dL (ref 13.0–17.7)
Immature Grans (Abs): 0 10*3/uL (ref 0.0–0.1)
Immature Granulocytes: 0 %
Lymphocytes Absolute: 1.4 10*3/uL (ref 0.7–3.1)
Lymphs: 20 %
MCH: 31 pg (ref 26.6–33.0)
MCHC: 33 g/dL (ref 31.5–35.7)
MCV: 94 fL (ref 79–97)
Monocytes Absolute: 0.5 10*3/uL (ref 0.1–0.9)
Monocytes: 8 %
Neutrophils Absolute: 5 10*3/uL (ref 1.4–7.0)
Neutrophils: 70 %
Platelets: 191 10*3/uL (ref 150–450)
RBC: 5.39 x10E6/uL (ref 4.14–5.80)
RDW: 12.3 % (ref 11.6–15.4)
WBC: 7.1 10*3/uL (ref 3.4–10.8)

## 2019-03-27 LAB — VITAMIN D 25 HYDROXY (VIT D DEFICIENCY, FRACTURES): Vit D, 25-Hydroxy: 45.2 ng/mL (ref 30.0–100.0)

## 2019-04-01 ENCOUNTER — Other Ambulatory Visit: Payer: Self-pay

## 2019-04-02 ENCOUNTER — Encounter: Payer: Self-pay | Admitting: Family Medicine

## 2019-04-02 ENCOUNTER — Ambulatory Visit (INDEPENDENT_AMBULATORY_CARE_PROVIDER_SITE_OTHER): Payer: Medicare Other | Admitting: Family Medicine

## 2019-04-02 VITALS — BP 119/75 | HR 69 | Temp 98.6°F | Ht 70.0 in | Wt 170.0 lb

## 2019-04-02 DIAGNOSIS — E785 Hyperlipidemia, unspecified: Secondary | ICD-10-CM | POA: Diagnosis not present

## 2019-04-02 DIAGNOSIS — L989 Disorder of the skin and subcutaneous tissue, unspecified: Secondary | ICD-10-CM | POA: Diagnosis not present

## 2019-04-02 DIAGNOSIS — R093 Abnormal sputum: Secondary | ICD-10-CM | POA: Diagnosis not present

## 2019-04-02 DIAGNOSIS — I6523 Occlusion and stenosis of bilateral carotid arteries: Secondary | ICD-10-CM | POA: Diagnosis not present

## 2019-04-02 NOTE — Progress Notes (Signed)
Subjective: CC: Establish care, hyperlipidemia PCP: Janora Norlander, DO [id number] Cameron [name]. is a 85 y.o. male presenting to clinic today for:  1.  Hyperlipidemia with carotid atherosclerosis Patient reports compliance with Crestor 5 mg 3 times a week.  He is never tried higher doses/increased frequency because he just does not like taking medicine.  Denies any history of intolerance to the statin. He had a carotid ultrasound in 2018 which showed 50% stenosis of bilateral carotid arteries.  No loss of consciousness, change in vision.  No chest pain, shortness of breath.  He is physically active, walking several miles per day.  2.  Right cheek lesion Patient reports a lesion on the right side of his jaw that has been present for several months.  Denies any growth, discoloration or spontaneous bleeding.  He has an appointment with his dermatologist coming up in about 6 to 8 weeks.  He just wanted to mention it  3.  Abdominal lump Patient reports that the left side of his abdomen seems to be fuller than the right side of his abdomen.  He denies any abdominal pain, nausea, vomiting, hematochezia or melena.  No unplanned weight loss.  4.  Dark phlegm Patient reports intermittent coughing up of dark black phlegm.  He notes his only couple of spots in his phlegm but wanted to know what I thought about this.  Denies any change in exercise tolerance, shortness of breath, wheeze, night sweats.  He is a former smoker that quit in the 80s.  He uses gas heat.  The darkness does seem to be improving with the use of an air purifier.   ROS: Per HPI  Allergies  Allergen Reactions  . Bee Venom Swelling    SWELLING REACTION UNSPECIFIED    Past Medical History:  Diagnosis Date  . Arthritis   . BPH (benign prostatic hyperplasia)   . Elevated PSA   . Erectile dysfunction   . Gilberts syndrome    elevated bile on liver tests in past  . Hyperlipidemia   . Inguinal hernia recurrent unilateral   .  Nocturia   . Osteoarthrosis and allied disorders   . Other and unspecified hyperlipidemia   . Rotator cuff tear, left    partial    Current Outpatient Medications:  .  aspirin EC 81 MG tablet, Take 81 mg by mouth every evening., Disp: , Rfl:  .  cholecalciferol (VITAMIN D) 1000 UNITS tablet, Take 2,000 Units by mouth daily. , Disp: , Rfl:  .  Coenzyme Q10 200 MG capsule, Take 200 mg by mouth 3 (three) times a week. , Disp: , Rfl:  .  Flaxseed, Linseed, (FLAX SEED OIL) 1000 MG CAPS, Take 1,000 mg by mouth every evening. , Disp: , Rfl:  .  Glucosamine-Chondroitin-Vit D3 1500-1200-800 MG-MG-UNIT PACK, Take 1 tablet by mouth every evening., Disp: , Rfl:  .  rosuvastatin (CRESTOR) 5 MG tablet, TAKE ONE TABLET BY MOUTH DAILY AT BEDTIME  (Patient taking differently: 3 times a week), Disp: 90 tablet, Rfl: 0 Social History   Socioeconomic History  . Marital status: Married    Spouse name: [name]  . Number of children: 4  . Years of education: 16  . Highest education level: Bachelor's degree (e.g., BA, AB, BS)  Occupational History  . Occupation: Retired  Scientific laboratory technician  . Financial resource strain: Not hard at all  . Food insecurity    Worry: Never true    Inability: Never true  . Transportation needs  Medical: No    Non-medical: No  Tobacco Use  . Smoking status: Former Smoker    Types: Pipe    Quit date: 10/05/1982    Years since quitting: 36.5  . Smokeless tobacco: Never Used  Substance and Sexual Activity  . Alcohol use: Yes    Alcohol/week: 1.0 standard drinks    Types: 1 Cans of beer per week    Comment: 1 beer qod, 1 glass wine 2x week  . Drug use: No  . Sexual activity: Yes    Birth control/protection: None  Lifestyle  . Physical activity    Days per week: 7 days    Minutes per session: 40 min  . Stress: Not at all  Relationships  . Social connections    Talks on phone: More than three times a week    Gets together: Twice a week    Attends religious service: Never     Active member of club or organization: No    Attends meetings of clubs or organizations: Never    Relationship status: Married  . Intimate partner violence    Fear of current or ex partner: No    Emotionally abused: No    Physically abused: No    Forced sexual activity: No  Other Topics Concern  . 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     Family History  Problem Relation Age of Onset  . COPD Mother   . Heart disease Father   . Heart attack Father 49       Died age 63  . Cancer Maternal Grandfather   . Heart attack Paternal Grandfather   . Hypertension Sister   . Hypertension Sister     Objective: Office vital signs reviewed. BP 119/75   Pulse 69   Temp 98.6 F (37 C) (Temporal)   Ht 5\' 10"  (1.778 m)   Wt 170 lb (77.1 kg)   SpO2 99%   BMI 24.39 kg/m   Physical Examination:  General: Awake, alert, well nourished, No acute distress HEENT: Normal; no carotid bruits Cardio: regular rate and rhythm, S1S2 heard, no murmurs appreciated Pulm: clear to auscultation bilaterally, no wheezes, rhonchi or rales; normal work of breathing on room air Extremities: warm, well perfused, No edema, cyanosis or clubbing; +2 pulses bilaterally MSK: He has a prominent left rib angle.  No abdominal masses appreciated. Skin: Quarter centimeter by quarter centimeter rough, flesh-colored lesion noted at the angle of the jaw on the right.  No appreciable hypervasculature or umbilication.  No bleeding.  Assessment/ Plan: 85 y.o. male   1. Hyperlipidemia, unspecified hyperlipidemia type We discussed his laboratory results.  Continue current regimen.  2. Atherosclerosis of both carotid arteries I reviewed his last carotid ultrasound.  He is asymptomatic.  3. Abnormal color of sputum This is likely environmental as this is not regular nor copious and seems to improving with the air purifier.  We discussed obtaining a chest x-ray if it becomes  more frequent or more copious or is accompanied by any other worrisome symptoms or signs.  He voiced good understanding and was in agreement with the plan  4. Skin lesion of cheek Likely a benign seborrheic keratosis.  However, would recommend follow-up with dermatology as scheduled.   No orders of the defined types were placed in this encounter.  No orders of the defined types were placed in this encounter.    Janora Norlander, DO [facility] [phone]

## 2019-04-22 ENCOUNTER — Other Ambulatory Visit: Payer: Self-pay | Admitting: Nurse Practitioner

## 2019-05-29 ENCOUNTER — Non-Acute Institutional Stay: Payer: Medicare Other | Admitting: Internal Medicine

## 2019-05-29 ENCOUNTER — Encounter: Payer: Self-pay | Admitting: Internal Medicine

## 2019-05-29 ENCOUNTER — Other Ambulatory Visit: Payer: Self-pay

## 2019-05-29 VITALS — BP 124/68 | HR 67 | Temp 97.6°F | Ht 70.0 in | Wt 170.7 lb

## 2019-05-29 DIAGNOSIS — Z8719 Personal history of other diseases of the digestive system: Secondary | ICD-10-CM | POA: Diagnosis not present

## 2019-05-29 DIAGNOSIS — R972 Elevated prostate specific antigen [PSA]: Secondary | ICD-10-CM | POA: Diagnosis not present

## 2019-05-29 DIAGNOSIS — E785 Hyperlipidemia, unspecified: Secondary | ICD-10-CM

## 2019-05-29 DIAGNOSIS — E559 Vitamin D deficiency, unspecified: Secondary | ICD-10-CM

## 2019-05-29 NOTE — Progress Notes (Signed)
Location:  [place] of Service:  Clinic (12)  Provider:   Code Status:  Goals of Care:  Advanced Directives 10/03/2018  Does Patient Have a Medical Advance Directive? Yes  Type of Paramedic of [place];Living will  Does patient want to make changes to medical advance directive? No - Patient declined  Copy of [place] in Chart? No - copy requested  Pre-existing out of facility DNR order (yellow form or pink MOST form) -     Chief Complaint  Patient presents with  . New Admit To SNF    Patient here today to establish care. He has no concerns. Patient did recieve both covid vaccinations.     HPI: Patient is a 86 y.o. male seen today for medical management of chronic diseases.   Patient has h/o  Hyperlipidemia Takes Crestor 3/week LDL less then 100 ? Rosanna Randy Syndrome with mild elevation of Bilirubin  H/o Elevated PSA Multiple Biopsy negative. Does not need any more follow up  Chronic Cough in the morning Does not bother him, No SOB on exertion Does have remote history of smoking  Lives with his wife in [place]. Walks with no assist Exercise around the facility. No Falls       Past Medical History:  Diagnosis Date  . Arthritis   . BPH (benign prostatic hyperplasia)   . Elevated PSA   . Erectile dysfunction   . Gilberts syndrome    elevated bile on liver tests in past  . Hyperlipidemia   . Inguinal hernia recurrent unilateral   . Nocturia   . Osteoarthrosis and allied disorders   . Other and unspecified hyperlipidemia   . Rotator cuff tear, left    partial    Past Surgical History:  Procedure Laterality Date  . CARPAL TUNNEL RELEASE Left 06/27/2013   Procedure: LEFT CARPAL TUNNEL RELEASE;  Surgeon: Cammie Sickle., MD;  Location: [place];  Service: Orthopedics;  Laterality: Left;  . CATARACT EXTRACTION, BILATERAL  06/03/2015  . EYE  SURGERY    . JOINT REPLACEMENT Left    knee  . KNEE ARTHROSCOPY     bilateral  . POSTERIOR CERVICAL FUSION/FORAMINOTOMY N/A 11/30/2016   Procedure: Cervical one-Cervical two Posterior Instrumentation and Fusion;  Surgeon: Newman Pies, MD;  Location: [place];  Service: Neurosurgery;  Laterality: N/A;  . right hand  1987   tendon repair   . TONSILLECTOMY     as child  . TOTAL KNEE ARTHROPLASTY  05/16/2012   Procedure: TOTAL KNEE ARTHROPLASTY;  Surgeon: Tobi Bastos, MD;  Location: [facility];  Service: Orthopedics;  Laterality: Left;  . TRANSURETHRAL RESECTION OF PROSTATE N/A 05/23/2013   Procedure: TRANSURETHRAL RESECTION OF THE PROSTATE WITH GYRUS INSTRUMENTS;  Surgeon: Ailene Rud, MD;  Location: [facility];  Service: Urology;  Laterality: N/A;    Allergies  Allergen Reactions  . Bee Venom Swelling    SWELLING REACTION UNSPECIFIED     Outpatient Encounter Medications as of 05/29/2019  Medication Sig  . aspirin EC 81 MG tablet Take 81 mg by mouth every Monday, Wednesday, and Friday.   . Calcium Carbonate (CALCIUM 500 PO) Take by mouth. 3 times a week  . cholecalciferol (VITAMIN D) 1000 UNITS tablet Take 2,000 Units by mouth daily.   . Coenzyme Q10 200 MG capsule Take 300 mg by mouth 3 (three) times a week.   . Flaxseed, Linseed, (FLAX SEED  OIL) 1000 MG CAPS Take 1,000 mg by mouth every evening.   . Glucosamine-Chondroitin-Vit D3 1500-1200-800 MG-MG-UNIT PACK Take 1 tablet by mouth every evening.  . rosuvastatin (CRESTOR) 5 MG tablet TAKE ONE TABLET BY MOUTH DAILY AT BEDTIME    No facility-administered encounter medications on file as of 05/29/2019.    Review of Systems:  Review of Systems  Review of Systems  Constitutional: Negative for activity change, appetite change, chills, diaphoresis, fatigue and fever.  HENT: Negative for mouth sores, postnasal drip, rhinorrhea, sinus pain and sore throat.   Respiratory: Negative for apnea, cough, chest tightness, shortness of breath  and wheezing.   Cardiovascular: Negative for chest pain, palpitations and leg swelling.  Gastrointestinal: Negative for abdominal distention, abdominal pain, constipation, diarrhea, nausea and vomiting.  Genitourinary: Negative for dysuria and frequency.  Musculoskeletal: Negative for arthralgias, joint swelling and myalgias.  Skin: Negative for rash.  Neurological: Negative for dizziness, syncope, weakness, light-headedness and numbness.  Psychiatric/Behavioral: Negative for behavioral problems, confusion and sleep disturbance.      Health Maintenance  Topic Date Due  . TETANUS/TDAP  12/25/2019  . INFLUENZA VACCINE  Completed  . PNA vac Low Risk Adult  Completed    Physical Exam: Vitals:   05/29/19 1606  BP: 124/68  Pulse: 67  Temp: 97.6 F (36.4 C)  SpO2: 98%  Weight: 170 lb 11.2 oz (77.4 kg)  Height: 5\' 10"  (1.778 m)   Body mass index is 24.49 kg/m. Physical Exam  Constitutional: Oriented to person, place, and time. Well-developed and well-nourished.  HENT:  Head: Normocephalic.  Mouth/Throat: Oropharynx is clear and moist.  Eyes: Pupils are equal, round, and reactive to light.  Ears No Wax Neck: Neck supple.  Cardiovascular: Normal rate and normal heart sounds.  No murmur heard. Pulmonary/Chest: Effort normal and breath sounds normal. No respiratory distress. No wheezes. She has no rales.  Abdominal: Soft. Bowel sounds are normal. No distension. There is no tenderness. There is no rebound.  Musculoskeletal: No edema.  Lymphadenopathy: none Neurological: Alert and oriented to person, place, and time.  Gait is stable. No  Focal Deficits Skin: Skin is warm and dry.  Psychiatric: Normal mood and affect. Behavior is normal. Thought content normal.   Labs reviewed: Basic Metabolic Panel: Recent Labs    03/26/19 0832  NA 142  K 4.2  CL 103  CO2 25  GLUCOSE 119*  BUN 22  CREATININE 0.89  CALCIUM 9.6   Liver Function Tests: Recent Labs    03/26/19 0832    AST 16  ALT 19  ALKPHOS 89  BILITOT 1.4*  PROT 6.5  ALBUMIN 4.5   No results for input(s): LIPASE, AMYLASE in the last 8760 hours. No results for input(s): AMMONIA in the last 8760 hours. CBC: Recent Labs    03/26/19 0832  WBC 7.1  NEUTROABS 5.0  HGB 16.7  HCT 50.6  MCV 94  PLT 191   Lipid Panel: Recent Labs    03/26/19 0832  CHOL 136  HDL 51  LDLCALC 73  TRIG 56  CHOLHDL 2.7   No results found for: HGBA1C  Procedures since last visit: No results found.  Assessment/Plan  Hyperlipidemia Continue Crestor. Is taking it 3/week  History of bilateral inguinal hernias ASymptomatic  Vitamin D deficiency Check the level in Next blood test  Elevated PSA Was told does not need any more Follow up   Gilberts Syndrome Follows with hepatic Panel Q 6 months Up to date on His vaccinations  Labs/tests  ordered:  * No order type specified * Next appt:  11/21/2019  Total time spent in this patient care encounter was  40_  minutes; greater than 50% of the visit spent counseling patient and staff, reviewing records , Labs and coordinating care for problems addressed at this encounter.

## 2019-08-21 ENCOUNTER — Other Ambulatory Visit: Payer: Self-pay | Admitting: Family Medicine

## 2019-10-17 ENCOUNTER — Ambulatory Visit: Payer: Medicare Other | Admitting: Family Medicine

## 2019-10-30 ENCOUNTER — Non-Acute Institutional Stay: Payer: Medicare Other | Admitting: Internal Medicine

## 2019-10-30 ENCOUNTER — Encounter: Payer: Self-pay | Admitting: Internal Medicine

## 2019-10-30 ENCOUNTER — Other Ambulatory Visit: Payer: Self-pay

## 2019-10-30 VITALS — BP 110/58 | HR 73 | Temp 97.8°F | Ht 70.0 in | Wt 174.2 lb

## 2019-10-30 DIAGNOSIS — M791 Myalgia, unspecified site: Secondary | ICD-10-CM | POA: Diagnosis not present

## 2019-10-30 DIAGNOSIS — R21 Rash and other nonspecific skin eruption: Secondary | ICD-10-CM

## 2019-10-30 NOTE — Progress Notes (Signed)
Location: [facility] of Service:  Clinic (12)  Provider:   Code Status:  Goals of Care:  Advanced Directives 10/03/2018  Does Patient Have a Medical Advance Directive? Yes  Type of Paramedic of [place];Living will  Does patient want to make changes to medical advance directive? No - Patient declined  Copy of [place] in Chart? No - copy requested  Pre-existing out of facility DNR order (yellow form or pink MOST form) -     Chief Complaint  Patient presents with  . Acute Visit    Left wrist rash    HPI: Patient is a 86 y.o. male seen today for an acute visit for rash on his left wrist Patient has a history of hyperlipidemia, history of elevated PSA and chronic cough  He noticed some rash on his wrist few weeks ago.  He was playing golf outside.  He applied hydrocortisone and Neosporin and states that the rash is almost resolved now. Denies any pain.  Did have itching and redness and swelling. Also wanted me to look at a small rash on his left leg.  Which seems like a bug bite he got while he was sitting outside. Patient also had pulled his muscle few weeks ago and is having pain in the morning when he gets up in his right leg but he is using heating pad and Advil as needed.  It seems to have helped him.  Past Medical History:  Diagnosis Date  . Arthritis   . BPH (benign prostatic hyperplasia)   . Elevated PSA   . Erectile dysfunction   . Gilberts syndrome    elevated bile on liver tests in past  . Hyperlipidemia   . Inguinal hernia recurrent unilateral   . Nocturia   . Osteoarthrosis and allied disorders   . Other and unspecified hyperlipidemia   . Rotator cuff tear, left    partial    Past Surgical History:  Procedure Laterality Date  . CARPAL TUNNEL RELEASE Left 06/27/2013   Procedure: LEFT CARPAL TUNNEL RELEASE;  Surgeon: Cammie Sickle., MD;  Location: [facility];  Service:  Orthopedics;  Laterality: Left;  . CATARACT EXTRACTION, BILATERAL  06/03/2015  . EYE SURGERY    . JOINT REPLACEMENT Left    knee  . KNEE ARTHROSCOPY     bilateral  . POSTERIOR CERVICAL FUSION/FORAMINOTOMY N/A 11/30/2016   Procedure: Cervical one-Cervical two Posterior Instrumentation and Fusion;  Surgeon: Newman Pies, MD;  Location: [facility];  Service: Neurosurgery;  Laterality: N/A;  . right hand  1987   tendon repair   . TONSILLECTOMY     as child  . TOTAL KNEE ARTHROPLASTY  05/16/2012   Procedure: TOTAL KNEE ARTHROPLASTY;  Surgeon: Tobi Bastos, MD;  Location: [facility];  Service: Orthopedics;  Laterality: Left;  . TRANSURETHRAL RESECTION OF PROSTATE N/A 05/23/2013   Procedure: TRANSURETHRAL RESECTION OF THE PROSTATE WITH GYRUS INSTRUMENTS;  Surgeon: Ailene Rud, MD;  Location: [facility];  Service: Urology;  Laterality: N/A;    Allergies  Allergen Reactions  . Bee Venom Swelling    SWELLING REACTION UNSPECIFIED     Outpatient Encounter Medications as of 10/30/2019  Medication Sig  . aspirin EC 81 MG tablet Take 81 mg by mouth every Monday, Wednesday, and Friday.   . Calcium Carbonate (CALCIUM 500 PO) Take by mouth. 3 times a week  . cholecalciferol (VITAMIN D) 1000 UNITS tablet Take 2,000 Units  by mouth daily.   . Coenzyme Q10 200 MG capsule Take 300 mg by mouth 3 (three) times a week.   . Flaxseed, Linseed, (FLAX SEED OIL) 1000 MG CAPS Take 1,000 mg by mouth every evening.   . Glucosamine-Chondroitin-Vit D3 1500-1200-800 MG-MG-UNIT PACK Take 1 tablet by mouth. 3x/week  . rosuvastatin (CRESTOR) 5 MG tablet TAKE ONE TABLET BY MOUTH DAILY AT BEDTIME    No facility-administered encounter medications on file as of 10/30/2019.    Review of Systems:  Review of Systems  Constitutional: Negative.   HENT: Negative.   Respiratory: Negative.   Cardiovascular: Negative.   Gastrointestinal: Negative.   Genitourinary: Negative.   Musculoskeletal: Positive for myalgias.  Skin:  Positive for rash.  Neurological: Negative.   Psychiatric/Behavioral: Negative.   All other systems reviewed and are negative.   Health Maintenance  Topic Date Due  . COVID-19 Vaccine (2 - Moderna 2-dose series) 06/24/2019  . INFLUENZA VACCINE  11/24/2019  . TETANUS/TDAP  12/25/2019  . PNA vac Low Risk Adult  Completed    Physical Exam: Vitals:   10/30/19 1503  BP: (!) 110/58  Pulse: 73  Temp: 97.8 F (36.6 C)  SpO2: 95%  Weight: 174 lb 3.2 oz (79 kg)  Height: 5\' 10"  (1.778 m)   Body mass index is 25 kg/m. Physical Exam Vitals reviewed.  Constitutional:      Appearance: Normal appearance.  HENT:     Head: Normocephalic.     Nose: Nose normal.     Mouth/Throat:     Mouth: Mucous membranes are moist.     Pharynx: Oropharynx is clear.  Eyes:     Pupils: Pupils are equal, round, and reactive to light.  Cardiovascular:     Rate and Rhythm: Normal rate and regular rhythm.     Pulses: Normal pulses.  Pulmonary:     Effort: Pulmonary effort is normal.     Breath sounds: Normal breath sounds.  Abdominal:     General: Abdomen is flat. Bowel sounds are normal.     Palpations: Abdomen is soft.  Musculoskeletal:        General: No swelling.     Cervical back: Neck supple.     Comments: Examined his right hip.  He did not have any problems with flexion extension and abduction. Gait was normal  Skin:    Comments: Redness completely resolved on his wrist. Does have a small bug bite on his left thigh  Neurological:     General: No focal deficit present.     Mental Status: He is alert and oriented to person, place, and time.  Psychiatric:        Mood and Affect: Mood normal.     Labs reviewed: Basic Metabolic Panel: Recent Labs    03/26/19 0832  NA 142  K 4.2  CL 103  CO2 25  GLUCOSE 119*  BUN 22  CREATININE 0.89  CALCIUM 9.6   Liver Function Tests: Recent Labs    03/26/19 0832  AST 16  ALT 19  ALKPHOS 89  BILITOT 1.4*  PROT 6.5  ALBUMIN 4.5   No  results for input(s): LIPASE, AMYLASE in the last 8760 hours. No results for input(s): AMMONIA in the last 8760 hours. CBC: Recent Labs    03/26/19 0832  WBC 7.1  NEUTROABS 5.0  HGB 16.7  HCT 50.6  MCV 94  PLT 191   Lipid Panel: Recent Labs    03/26/19 0832  CHOL 136  HDL 51  LDLCALC 73  TRIG 56  CHOLHDL 2.7   No results found for: HGBA1C  Procedures since last visit: No results found.  Assessment/Plan Rash Keep Using  Hydrocortisone PRN  Muscular pain If pain not better will need Xray Try Tylenol PRn He has appointment for follow up with me in few weeks    Labs/tests ordered:  * No order type specified * Next appt:  11/21/2019

## 2019-10-31 ENCOUNTER — Encounter: Payer: Self-pay | Admitting: Internal Medicine

## 2019-10-31 NOTE — Progress Notes (Signed)
A user error has taken place.

## 2019-11-21 ENCOUNTER — Other Ambulatory Visit: Payer: Self-pay

## 2019-11-21 DIAGNOSIS — E559 Vitamin D deficiency, unspecified: Secondary | ICD-10-CM

## 2019-11-21 DIAGNOSIS — E785 Hyperlipidemia, unspecified: Secondary | ICD-10-CM

## 2019-11-22 LAB — LIPID PANEL
Cholesterol: 137 mg/dL (ref <200)
HDL: 53 mg/dL (ref >=40)
LDL Cholesterol (Calc): 71 mg/dL (calc)
Non-HDL Cholesterol (Calc): 84 mg/dL (calc) (ref <130)
Total CHOL/HDL Ratio: 2.6 (calc) (ref <5.0)
Triglycerides: 58 mg/dL (ref <150)

## 2019-11-22 LAB — HEPATIC FUNCTION PANEL
AG Ratio: 2 (calc) (ref 1.0–2.5)
ALT: 16 U/L (ref 9–46)
AST: 16 U/L (ref 10–35)
Albumin: 4.3 g/dL (ref 3.6–5.1)
Alkaline phosphatase (APISO): 66 U/L (ref 35–144)
Bilirubin, Direct: 0.4 mg/dL — ABNORMAL HIGH (ref 0.0–0.2)
Globulin: 2.1 g/dL (calc) (ref 1.9–3.7)
Indirect Bilirubin: 1.5 mg/dL (calc) — ABNORMAL HIGH (ref 0.2–1.2)
Total Bilirubin: 1.9 mg/dL — ABNORMAL HIGH (ref 0.2–1.2)
Total Protein: 6.4 g/dL (ref 6.1–8.1)

## 2019-11-22 LAB — VITAMIN D 25 HYDROXY (VIT D DEFICIENCY, FRACTURES): Vit D, 25-Hydroxy: 44 ng/mL (ref 30–100)

## 2019-11-22 LAB — BASIC METABOLIC PANEL
BUN: 21 mg/dL (ref 7–25)
CO2: 28 mmol/L (ref 20–32)
Calcium: 9.4 mg/dL (ref 8.6–10.3)
Chloride: 103 mmol/L (ref 98–110)
Creat: 0.82 mg/dL (ref 0.70–1.11)
Glucose, Bld: 100 mg/dL — ABNORMAL HIGH (ref 65–99)
Potassium: 4.1 mmol/L (ref 3.5–5.3)
Sodium: 141 mmol/L (ref 135–146)

## 2019-11-22 LAB — CBC WITH DIFFERENTIAL/PLATELET
Absolute Monocytes: 561 cells/uL (ref 200–950)
Basophils Absolute: 53 cells/uL (ref 0–200)
Basophils Relative: 0.9 %
Eosinophils Absolute: 153 cells/uL (ref 15–500)
Eosinophils Relative: 2.6 %
HCT: 48.8 % (ref 38.5–50.0)
Hemoglobin: 16.6 g/dL (ref 13.2–17.1)
Lymphs Abs: 1416 cells/uL (ref 850–3900)
MCH: 31.2 pg (ref 27.0–33.0)
MCHC: 34 g/dL (ref 32.0–36.0)
MCV: 91.7 fL (ref 80.0–100.0)
MPV: 10.6 fL (ref 7.5–12.5)
Monocytes Relative: 9.5 %
Neutro Abs: 3717 cells/uL (ref 1500–7800)
Neutrophils Relative %: 63 %
Platelets: 186 10*3/uL (ref 140–400)
RBC: 5.32 10*6/uL (ref 4.20–5.80)
RDW: 12.2 % (ref 11.0–15.0)
Total Lymphocyte: 24 %
WBC: 5.9 10*3/uL (ref 3.8–10.8)

## 2019-11-22 LAB — TSH: TSH: 1.83 mIU/L (ref 0.40–4.50)

## 2019-11-27 ENCOUNTER — Encounter: Payer: Self-pay | Admitting: Internal Medicine

## 2019-12-18 ENCOUNTER — Non-Acute Institutional Stay: Payer: Medicare Other | Admitting: Internal Medicine

## 2019-12-18 ENCOUNTER — Other Ambulatory Visit: Payer: Self-pay

## 2019-12-18 ENCOUNTER — Encounter: Payer: Self-pay | Admitting: Internal Medicine

## 2019-12-18 VITALS — BP 126/68 | HR 81 | Temp 97.9°F | Ht 70.0 in | Wt 169.6 lb

## 2019-12-18 DIAGNOSIS — E785 Hyperlipidemia, unspecified: Secondary | ICD-10-CM

## 2019-12-18 DIAGNOSIS — Z8719 Personal history of other diseases of the digestive system: Secondary | ICD-10-CM | POA: Diagnosis not present

## 2019-12-18 DIAGNOSIS — R972 Elevated prostate specific antigen [PSA]: Secondary | ICD-10-CM

## 2019-12-18 DIAGNOSIS — E559 Vitamin D deficiency, unspecified: Secondary | ICD-10-CM

## 2019-12-18 NOTE — Progress Notes (Signed)
Location:  [place] of Service:  Clinic (12)  Provider:   Code Status:  Goals of Care:  Advanced Directives 12/18/2019  Does Patient Have a Medical Advance Directive? Yes  Type of Paramedic of [place];Living will  Does patient want to make changes to medical advance directive? No - Patient declined  Copy of [place] in Chart? Yes - validated most recent copy scanned in chart (See row information)  Pre-existing out of facility DNR order (yellow form or pink MOST form) -     Chief Complaint  Patient presents with  . Medical Management of Chronic Issues    Patient returns to the clinic for his 6 month follow up.   Marland Kitchen Health Maintenance    COVID 19    HPI: Patient is a 86 y.o. male seen today for medical management of chronic diseases.   Has h/o  Hyperlipidemia Takes Crestor 3/week LDL less then 100 Gilbert Syndrome Mild Elevated Indirect Bilirubin H/o Elevated PSA Has had number of Biopsy and they have been Negative  Is stable No Complains today Lives with his wife in [place]. Walks with no assist Exercise around the facility. No Falls  Past Medical History:  Diagnosis Date  . Arthritis   . BPH (benign prostatic hyperplasia)   . Elevated PSA   . Erectile dysfunction   . Gilberts syndrome    elevated bile on liver tests in past  . Hyperlipidemia   . Inguinal hernia recurrent unilateral   . Nocturia   . Osteoarthrosis and allied disorders   . Other and unspecified hyperlipidemia   . Rotator cuff tear, left    partial    Past Surgical History:  Procedure Laterality Date  . CARPAL TUNNEL RELEASE Left 06/27/2013   Procedure: LEFT CARPAL TUNNEL RELEASE;  Surgeon: Cammie Sickle., MD;  Location: [facility];  Service: Orthopedics;  Laterality: Left;  . CATARACT EXTRACTION, BILATERAL  06/03/2015  . EYE SURGERY    . JOINT REPLACEMENT Left    knee  .  KNEE ARTHROSCOPY     bilateral  . POSTERIOR CERVICAL FUSION/FORAMINOTOMY N/A 11/30/2016   Procedure: Cervical one-Cervical two Posterior Instrumentation and Fusion;  Surgeon: Newman Pies, MD;  Location: [facility];  Service: Neurosurgery;  Laterality: N/A;  . right hand  1987   tendon repair   . TONSILLECTOMY     as child  . TOTAL KNEE ARTHROPLASTY  05/16/2012   Procedure: TOTAL KNEE ARTHROPLASTY;  Surgeon: Tobi Bastos, MD;  [facility];  Service: Orthopedics;  Laterality: Left;  . TRANSURETHRAL RESECTION OF PROSTATE N/A 05/23/2013   Procedure: TRANSURETHRAL RESECTION OF THE PROSTATE WITH GYRUS INSTRUMENTS;  Surgeon: Ailene Rud, MD;  [facility];  Service: Urology;  Laterality: N/A;    Allergies  Allergen Reactions  . Bee Venom Swelling    SWELLING REACTION UNSPECIFIED     Outpatient Encounter Medications as of 12/18/2019  Medication Sig  . aspirin EC 81 MG tablet Take 81 mg by mouth every Monday, Wednesday, and Friday.   . Calcium Carbonate (CALCIUM 500 PO) Take by mouth. 3 times a week  . cholecalciferol (VITAMIN D) 1000 UNITS tablet Take 2,000 Units by mouth daily.   . Coenzyme Q10 200 MG capsule Take 300 mg by mouth 3 (three) times a week.   . Flaxseed, Linseed, (FLAX SEED OIL) 1000 MG CAPS Take 1,000 mg by mouth every evening.   Marland Kitchen  Glucosamine-Chondroitin-Vit D3 1500-1200-800 MG-MG-UNIT PACK Take 1 tablet by mouth. 3x/week  . rosuvastatin (CRESTOR) 5 MG tablet TAKE ONE TABLET BY MOUTH DAILY AT BEDTIME  (Patient taking differently: 3 times a week)   No facility-administered encounter medications on file as of 12/18/2019.    Review of Systems:  Review of Systems  Review of Systems  Constitutional: Negative for activity change, appetite change, chills, diaphoresis, fatigue and fever.  HENT: Negative for mouth sores, postnasal drip, rhinorrhea, sinus pain and sore throat.   Respiratory: Negative for apnea, cough, chest tightness, shortness of breath and  wheezing.   Cardiovascular: Negative for chest pain, palpitations and leg swelling.  Gastrointestinal: Negative for abdominal distention, abdominal pain, constipation, diarrhea, nausea and vomiting.  Genitourinary: Negative for dysuria and frequency.  Musculoskeletal: Negative for arthralgias, joint swelling and myalgias.  Skin: Negative for rash.  Neurological: Negative for dizziness, syncope, weakness, light-headedness and numbness.  Psychiatric/Behavioral: Negative for behavioral problems, confusion and sleep disturbance.     Health Maintenance  Topic Date Due  . COVID-19 Vaccine (2 - Moderna 2-dose series) 06/24/2019  . INFLUENZA VACCINE  11/24/2019  . TETANUS/TDAP  12/25/2019  . PNA vac Low Risk Adult  Completed    Physical Exam: Vitals:   12/18/19 1302  BP: 126/68  Pulse: 81  Temp: 97.9 F (36.6 C)  SpO2: 96%  Weight: 169 lb 9.6 oz (76.9 kg)  Height: 5\' 10"  (1.778 m)   Body mass index is 24.34 kg/m. Physical Exam  Constitutional: Oriented to person, place, and time. Well-developed and well-nourished.  HENT:  Head: Normocephalic.  Ears TM Normal No Wax Mouth/Throat: Oropharynx is clear and moist.  Eyes: Pupils are equal, round, and reactive to light.  Neck: Neck supple.  Cardiovascular: Normal rate and normal heart sounds.  No murmur heard. Pulmonary/Chest: Effort normal and breath sounds normal. No respiratory distress. No wheezes. She has no rales.  Abdominal: Soft. Bowel sounds are normal. No distension. There is no tenderness. There is no rebound.  Musculoskeletal: No edema.  Lymphadenopathy: none Neurological: Alert and oriented to person, place, and time. No Focal Deficits  Skin: Skin is warm and dry.  Psychiatric: Normal mood and affect. Behavior is normal. Thought content normal.    Labs reviewed: Basic Metabolic Panel: Recent Labs    03/26/19 0832 11/21/19 0825  NA 142 141  K 4.2 4.1  CL 103 103  CO2 25 28  GLUCOSE 119* 100*  BUN 22 21    CREATININE 0.89 0.82  CALCIUM 9.6 9.4  TSH  --  1.83   Liver Function Tests: Recent Labs    03/26/19 0832 11/21/19 0825  AST 16 16  ALT 19 16  ALKPHOS 89  --   BILITOT 1.4* 1.9*  PROT 6.5 6.4  ALBUMIN 4.5  --    No results for input(s): LIPASE, AMYLASE in the last 8760 hours. No results for input(s): AMMONIA in the last 8760 hours. CBC: Recent Labs    03/26/19 0832 11/21/19 0825  WBC 7.1 5.9  NEUTROABS 5.0 3,717  HGB 16.7 16.6  HCT 50.6 48.8  MCV 94 91.7  PLT 191 186   Lipid Panel: Recent Labs    03/26/19 0832 11/21/19 0825  CHOL 136 137  HDL 51 53  LDLCALC 73 71  TRIG 56 58  CHOLHDL 2.7 2.6   No results found for: HGBA1C  Procedures since last visit: No results found.  Assessment/Plan Hyperlipidemia, unspecified hyperlipidemia type LDL Less then 100 History of bilateral inguinal hernias Asymptomatic  Gilberts syndrome Liver Function Stable Vitamin D deficiency Level was normal H/O Elevated PSA S/P Number of Biopsy in the Past and they were negative     Labs/tests ordered:  * No order type specified * Next appt:  12/24/2019

## 2019-12-24 ENCOUNTER — Encounter: Payer: Self-pay | Admitting: Nurse Practitioner

## 2020-02-26 ENCOUNTER — Other Ambulatory Visit: Payer: Self-pay | Admitting: Family Medicine

## 2020-02-26 ENCOUNTER — Other Ambulatory Visit: Payer: Self-pay | Admitting: *Deleted

## 2020-02-26 DIAGNOSIS — Z91030 Bee allergy status: Secondary | ICD-10-CM

## 2020-02-26 MED ORDER — EPINEPHRINE 0.3 MG/0.3ML IJ SOSY: PREFILLED_SYRINGE | INTRAMUSCULAR | 1 refills | Status: DC

## 2020-02-26 MED ORDER — ROSUVASTATIN CALCIUM 5 MG PO TABS: ORAL_TABLET | ORAL | 1 refills | Status: DC

## 2020-02-26 NOTE — Telephone Encounter (Signed)
Patient called requesting refills.  Epinephrine was not in current medication list but patient stated that he does use it as needed and his has expired.   Pended Rx's and sent to Dr. Lyndel Safe for approval.

## 2020-03-09 DIAGNOSIS — Z23 Encounter for immunization: Secondary | ICD-10-CM | POA: Diagnosis not present

## 2020-06-11 ENCOUNTER — Other Ambulatory Visit: Payer: Self-pay

## 2020-06-11 DIAGNOSIS — E785 Hyperlipidemia, unspecified: Secondary | ICD-10-CM | POA: Diagnosis not present

## 2020-06-11 LAB — CBC WITH DIFFERENTIAL/PLATELET
Absolute Monocytes: 561 cells/uL (ref 200–950)
Basophils Absolute: 41 cells/uL (ref 0–200)
Basophils Relative: 0.7 %
Eosinophils Absolute: 142 cells/uL (ref 15–500)
Eosinophils Relative: 2.4 %
HCT: 50 % (ref 38.5–50.0)
Hemoglobin: 16.7 g/dL (ref 13.2–17.1)
Lymphs Abs: 1192 cells/uL (ref 850–3900)
MCH: 31 pg (ref 27.0–33.0)
MCHC: 33.4 g/dL (ref 32.0–36.0)
MCV: 92.9 fL (ref 80.0–100.0)
MPV: 10.7 fL (ref 7.5–12.5)
Monocytes Relative: 9.5 %
Neutro Abs: 3965 cells/uL (ref 1500–7800)
Neutrophils Relative %: 67.2 %
Platelets: 201 10*3/uL (ref 140–400)
RBC: 5.38 10*6/uL (ref 4.20–5.80)
RDW: 12.1 % (ref 11.0–15.0)
Total Lymphocyte: 20.2 %
WBC: 5.9 10*3/uL (ref 3.8–10.8)

## 2020-06-11 LAB — HEPATIC FUNCTION PANEL
AG Ratio: 2 (calc) (ref 1.0–2.5)
ALT: 20 U/L (ref 9–46)
AST: 18 U/L (ref 10–35)
Albumin: 4.3 g/dL (ref 3.6–5.1)
Alkaline phosphatase (APISO): 74 U/L (ref 35–144)
Bilirubin, Direct: 0.3 mg/dL — ABNORMAL HIGH (ref 0.0–0.2)
Globulin: 2.2 g/dL (calc) (ref 1.9–3.7)
Indirect Bilirubin: 1.1 mg/dL (calc) (ref 0.2–1.2)
Total Bilirubin: 1.4 mg/dL — ABNORMAL HIGH (ref 0.2–1.2)
Total Protein: 6.5 g/dL (ref 6.1–8.1)

## 2020-06-11 LAB — BASIC METABOLIC PANEL WITH GFR
BUN: 21 mg/dL (ref 7–25)
CO2: 30 mmol/L (ref 20–32)
Calcium: 9.5 mg/dL (ref 8.6–10.3)
Chloride: 104 mmol/L (ref 98–110)
Creat: 0.88 mg/dL (ref 0.70–1.11)
GFR, Est African American: 90 mL/min/{1.73_m2} (ref >=60)
GFR, Est Non African American: 78 mL/min/{1.73_m2} (ref >=60)
Glucose, Bld: 103 mg/dL — ABNORMAL HIGH (ref 65–99)
Potassium: 4.4 mmol/L (ref 3.5–5.3)
Sodium: 140 mmol/L (ref 135–146)

## 2020-06-11 LAB — LIPID PANEL
Cholesterol: 134 mg/dL (ref <200)
HDL: 50 mg/dL (ref >=40)
LDL Cholesterol (Calc): 70 mg/dL (calc)
Non-HDL Cholesterol (Calc): 84 mg/dL (calc) (ref <130)
Total CHOL/HDL Ratio: 2.7 (calc) (ref <5.0)
Triglycerides: 48 mg/dL (ref <150)

## 2020-06-17 ENCOUNTER — Other Ambulatory Visit: Payer: Self-pay

## 2020-06-17 ENCOUNTER — Encounter: Payer: Self-pay | Admitting: Internal Medicine

## 2020-06-17 ENCOUNTER — Non-Acute Institutional Stay: Payer: Medicare Other | Admitting: Internal Medicine

## 2020-06-17 VITALS — BP 120/70 | HR 65 | Temp 96.4°F | Ht 70.0 in | Wt 177.0 lb

## 2020-06-17 DIAGNOSIS — E785 Hyperlipidemia, unspecified: Secondary | ICD-10-CM | POA: Diagnosis not present

## 2020-06-17 DIAGNOSIS — R972 Elevated prostate specific antigen [PSA]: Secondary | ICD-10-CM

## 2020-06-17 DIAGNOSIS — E559 Vitamin D deficiency, unspecified: Secondary | ICD-10-CM | POA: Diagnosis not present

## 2020-06-17 DIAGNOSIS — Z8719 Personal history of other diseases of the digestive system: Secondary | ICD-10-CM

## 2020-06-17 DIAGNOSIS — R059 Cough, unspecified: Secondary | ICD-10-CM

## 2020-06-17 MED ORDER — TETANUS-DIPHTH-ACELL PERTUSSIS 5-2.5-18.5 LF-MCG/0.5 IM SUSP: 0.5000 mL | Freq: Once | INTRAMUSCULAR | 0 refills | Status: AC

## 2020-06-17 NOTE — Progress Notes (Signed)
Location:  [facility] of Service:  Clinic (12)  Provider:   Code Status:  Goals of Care:  Advanced Directives 12/18/2019  Does Patient Have a Medical Advance Directive? Yes  Type of Paramedic of [place];Living will  Does patient want to make changes to medical advance directive? No - Patient declined  Copy of [place] in Chart? Yes - validated most recent copy scanned in chart (See row information)  Pre-existing out of facility DNR order (yellow form or pink MOST form) -     Chief Complaint  Patient presents with  . Medical Management of Chronic Issues    Patient returns to the clinic for 6 month follow. Would like to discuss getting a chest xray.    HPI: Patient is a 87 y.o. male seen today for medical management of chronic diseases.   Has h/o  Hyperlipidemia Takes Crestor 3/week LDL less then 100 Gilbert Syndrome Mild Elevated Indirect Bilirubin H/o Elevated PSA Has had number of Biopsy and they have been Negative  Today was c/o Cough Says there is lot of dust in his Apartment. Cough is mostly in the morning With Clear sputum No SOB or fever or chest pain Use to smoke but 30 years ago  Has gained 7 pounds as unable to play Golf due to weather Lives with his wife in [place]. Walks with no assist Exercise around the facility. No Falls  Past Medical History:  Diagnosis Date  . Arthritis   . BPH (benign prostatic hyperplasia)   . Elevated PSA   . Erectile dysfunction   . Gilberts syndrome    elevated bile on liver tests in past  . Hyperlipidemia   . Inguinal hernia recurrent unilateral   . Nocturia   . Osteoarthrosis and allied disorders   . Other and unspecified hyperlipidemia   . Rotator cuff tear, left    partial    Past Surgical History:  Procedure Laterality Date  . CARPAL TUNNEL RELEASE Left 06/27/2013   Procedure: LEFT CARPAL TUNNEL RELEASE;  Surgeon:  Cammie Sickle., MD;  Location: [facility];  Service: Orthopedics;  Laterality: Left;  . CATARACT EXTRACTION, BILATERAL  06/03/2015  . EYE SURGERY    . JOINT REPLACEMENT Left    knee  . KNEE ARTHROSCOPY     bilateral  . POSTERIOR CERVICAL FUSION/FORAMINOTOMY N/A 11/30/2016   Procedure: Cervical one-Cervical two Posterior Instrumentation and Fusion;  Surgeon: Newman Pies, MD;  Location: [facility];  Service: Neurosurgery;  Laterality: N/A;  . right hand  1987   tendon repair   . TONSILLECTOMY     as child  . TOTAL KNEE ARTHROPLASTY  05/16/2012   Procedure: TOTAL KNEE ARTHROPLASTY;  Surgeon: Tobi Bastos, MD;  Location: [facility];  Service: Orthopedics;  Laterality: Left;  . TRANSURETHRAL RESECTION OF PROSTATE N/A 05/23/2013   Procedure: TRANSURETHRAL RESECTION OF THE PROSTATE WITH GYRUS INSTRUMENTS;  Surgeon: Ailene Rud, MD;  Location: [facility];  Service: Urology;  Laterality: N/A;    Allergies  Allergen Reactions  . Bee Venom Swelling    SWELLING REACTION UNSPECIFIED     Outpatient Encounter Medications as of 06/17/2020  Medication Sig  . aspirin EC 81 MG tablet Take 81 mg by mouth every Monday, Wednesday, and Friday.   . Calcium Carbonate (CALCIUM 500 PO) Take 1 capsule by mouth daily. 3 times a week  . cholecalciferol (VITAMIN D) 1000  UNITS tablet Take 2,000 Units by mouth daily.   . Coenzyme Q10 200 MG capsule Take 300 mg by mouth 3 (three) times a week.   Marland Kitchen EPINEPHrine 0.3 MG/0.3ML SOSY Inject .28ml into muscle 1 x dose. As needed  . Flaxseed, Linseed, (FLAX SEED OIL) 1000 MG CAPS Take 1,000 mg by mouth every evening.   . Glucosamine-Chondroitin-Vit D3 1500-1200-800 MG-MG-UNIT PACK Take 1 tablet by mouth. 3x/week  . rosuvastatin (CRESTOR) 5 MG tablet Take one tablet by mouth every other day  . [DISCONTINUED] Tdap (BOOSTRIX) 5-2.5-18.5 LF-MCG/0.5 injection Inject 0.5 mLs into the muscle once.  . Tdap (BOOSTRIX) 5-2.5-18.5 LF-MCG/0.5 injection Inject 0.5  mLs into the muscle once for 1 dose.   No facility-administered encounter medications on file as of 06/17/2020.    Review of Systems:  Review of Systems  Review of Systems  Constitutional: Negative for activity change, appetite change, chills, diaphoresis, fatigue and fever.  HENT: Negative for mouth sores, postnasal drip, rhinorrhea, sinus pain and sore throat.   Respiratory: Negative for apnea, , chest tightness, shortness of breath and wheezing.   Cardiovascular: Negative for chest pain, palpitations and leg swelling.  Gastrointestinal: Negative for abdominal distention, abdominal pain, constipation, diarrhea, nausea and vomiting.  Genitourinary: Negative for dysuria and frequency.  Musculoskeletal: Negative for arthralgias, joint swelling and myalgias.  Skin: Negative for rash.  Neurological: Negative for dizziness, syncope, weakness, light-headedness and numbness.  Psychiatric/Behavioral: Negative for behavioral problems, confusion and sleep disturbance.     Health Maintenance  Topic Date Due  . COVID-19 Vaccine (2 - Moderna 3-dose series) 06/24/2019  . INFLUENZA VACCINE  11/24/2019  . TETANUS/TDAP  12/25/2019  . PNA vac Low Risk Adult  Completed    Physical Exam: Vitals:   06/17/20 1301  BP: 120/70  Pulse: 65  Temp: (!) 96.4 F (35.8 C)  SpO2: 96%  Weight: 177 lb (80.3 kg)  Height: 5\' 10"  (1.778 m)   Body mass index is 25.4 kg/m. Physical Exam Constitutional: Oriented to person, place, and time. Well-developed and well-nourished.  HENT:  Head: Normocephalic.  Mouth/Throat: Oropharynx is clear and moist.  Ears TM normal Eyes: Pupils are equal, round, and reactive to light.  Neck: Neck supple.  Cardiovascular: Normal rate and normal heart sounds.  No murmur heard. Pulmonary/Chest: Effort normal and breath sounds normal. No respiratory distress. No wheezes. She has no rales.  Abdominal: Soft. Bowel sounds are normal. No distension. There is no tenderness. There  is no rebound.  Musculoskeletal: No edema.  Lymphadenopathy: none Neurological: Alert and oriented to person, place, and time.  Skin: Skin is warm and dry.  Psychiatric: Normal mood and affect. Behavior is normal. Thought content normal.   Labs reviewed: Basic Metabolic Panel: Recent Labs    11/21/19 0825 06/11/20 0810  NA 141 140  K 4.1 4.4  CL 103 104  CO2 28 30  GLUCOSE 100* 103*  BUN 21 21  CREATININE 0.82 0.88  CALCIUM 9.4 9.5  TSH 1.83  --    Liver Function Tests: Recent Labs    11/21/19 0825 06/11/20 0810  AST 16 18  ALT 16 20  BILITOT 1.9* 1.4*  PROT 6.4 6.5   No results for input(s): LIPASE, AMYLASE in the last 8760 hours. No results for input(s): AMMONIA in the last 8760 hours. CBC: Recent Labs    11/21/19 0825 06/11/20 0810  WBC 5.9 5.9  NEUTROABS 3,717 3,965  HGB 16.6 16.7  HCT 48.8 50.0  MCV 91.7 92.9  PLT 186  201   Lipid Panel: Recent Labs    11/21/19 0825 06/11/20 0810  CHOL 137 134  HDL 53 50  LDLCALC 71 70  TRIG 58 48  CHOLHDL 2.6 2.7   No results found for: HGBA1C  Procedures since last visit: No results found.  Assessment/Plan 1. Cough Does not want any Meds for it right now - DG Chest 2 View; Future - CBC with Differential/Platelet; Future - COMPLETE METABOLIC PANEL WITH GFR; Future - TSH; Future  2. Gilberts syndrome Stable  3. History of bilateral inguinal hernias Stable  4. Hyperlipidemia, unspecified hyperlipidemia type Continue Crestor - TSH; Future - Lipid panel; Future  5. Vitamin D deficiency Supplement  6. Elevated PSA S/P Number of Biopsy in the Past and they were negative     Labs/tests ordered:  * No order type specified * Next appt:  12/10/2020

## 2020-06-18 ENCOUNTER — Ambulatory Visit
Admission: RE | Admit: 2020-06-18 | Discharge: 2020-06-18 | Disposition: A | Discharge status: Home / Self Care | Payer: Medicare Other | Source: Ambulatory Visit | Attending: Internal Medicine | Admitting: Internal Medicine

## 2020-06-18 ENCOUNTER — Other Ambulatory Visit: Payer: Self-pay

## 2020-06-18 DIAGNOSIS — R059 Cough, unspecified: Secondary | ICD-10-CM

## 2020-06-25 DIAGNOSIS — L570 Actinic keratosis: Secondary | ICD-10-CM | POA: Diagnosis not present

## 2020-06-25 DIAGNOSIS — D692 Other nonthrombocytopenic purpura: Secondary | ICD-10-CM | POA: Diagnosis not present

## 2020-06-25 DIAGNOSIS — D225 Melanocytic nevi of trunk: Secondary | ICD-10-CM | POA: Diagnosis not present

## 2020-06-25 DIAGNOSIS — D1801 Hemangioma of skin and subcutaneous tissue: Secondary | ICD-10-CM | POA: Diagnosis not present

## 2020-06-25 DIAGNOSIS — L814 Other melanin hyperpigmentation: Secondary | ICD-10-CM | POA: Diagnosis not present

## 2020-06-25 DIAGNOSIS — L821 Other seborrheic keratosis: Secondary | ICD-10-CM | POA: Diagnosis not present

## 2020-06-25 DIAGNOSIS — Z85828 Personal history of other malignant neoplasm of skin: Secondary | ICD-10-CM | POA: Diagnosis not present

## 2020-06-25 DIAGNOSIS — L853 Xerosis cutis: Secondary | ICD-10-CM | POA: Diagnosis not present

## 2020-06-25 DIAGNOSIS — L720 Epidermal cyst: Secondary | ICD-10-CM | POA: Diagnosis not present

## 2020-07-08 ENCOUNTER — Encounter: Payer: Self-pay | Admitting: Internal Medicine

## 2020-07-08 ENCOUNTER — Non-Acute Institutional Stay: Payer: Medicare Other | Admitting: Internal Medicine

## 2020-07-08 ENCOUNTER — Other Ambulatory Visit: Payer: Self-pay

## 2020-07-08 VITALS — BP 120/70 | HR 75 | Temp 97.2°F | Ht 70.0 in | Wt 174.5 lb

## 2020-07-08 DIAGNOSIS — R059 Cough, unspecified: Secondary | ICD-10-CM

## 2020-07-08 DIAGNOSIS — J410 Simple chronic bronchitis: Secondary | ICD-10-CM

## 2020-07-08 NOTE — Progress Notes (Signed)
Location: [facility] of Service:  Clinic (12)  Provider:   Code Status:  Goals of Care:  Advanced Directives 12/18/2019  Does Patient Have a Medical Advance Directive? Yes  Type of Paramedic of [place];Living will  Does patient want to make changes to medical advance directive? No - Patient declined  Copy of [place] in Chart? Yes - validated most recent copy scanned in chart (See row information)  Pre-existing out of facility DNR order (yellow form or pink MOST form) -     Chief Complaint  Patient presents with  . Medical Management of Chronic Issues    Patient returns to the clinic to discuss his xray results.    HPI: Patient is a 87 y.o. male seen today for an acute visit for His Persistent cough and Chest Xray results  Patient has a history of hyperlipidemia, Gilbert's syndrome and elevated PSA  Was seen for cough few weeks ago.  Cough is mostly in the morning.  With clear sputum.  No shortness of breath fever or chest pain.  Denies any dyspnea on exertion.  No PND Patient has had off-and-on cough for many months but now recently has been worse. Quit smoking 30 years ago.  Has noticed some dust in his apartment Had chest x-ray done which showed chronic bronchitis changes Past Medical History:  Diagnosis Date  . Arthritis   . BPH (benign prostatic hyperplasia)   . Elevated PSA   . Erectile dysfunction   . Gilberts syndrome    elevated bile on liver tests in past  . Hyperlipidemia   . Inguinal hernia recurrent unilateral   . Nocturia   . Osteoarthrosis and allied disorders   . Other and unspecified hyperlipidemia   . Rotator cuff tear, left    partial    Past Surgical History:  Procedure Laterality Date  . CARPAL TUNNEL RELEASE Left 06/27/2013   Procedure: LEFT CARPAL TUNNEL RELEASE;  Surgeon: Cammie Sickle., MD;  Location: [facility];  Service: Orthopedics;  Laterality: Left;  .  CATARACT EXTRACTION, BILATERAL  06/03/2015  . EYE SURGERY    . JOINT REPLACEMENT Left    knee  . KNEE ARTHROSCOPY     bilateral  . POSTERIOR CERVICAL FUSION/FORAMINOTOMY N/A 11/30/2016   Procedure: Cervical one-Cervical two Posterior Instrumentation and Fusion;  Surgeon: Newman Pies, MD;  Location: [facility];  Service: Neurosurgery;  Laterality: N/A;  . right hand  1987   tendon repair   . TONSILLECTOMY     as child  . TOTAL KNEE ARTHROPLASTY  05/16/2012   Procedure: TOTAL KNEE ARTHROPLASTY;  Surgeon: Tobi Bastos, MD;  Location: [facility];  Service: Orthopedics;  Laterality: Left;  . TRANSURETHRAL RESECTION OF PROSTATE N/A 05/23/2013   Procedure: TRANSURETHRAL RESECTION OF THE PROSTATE WITH GYRUS INSTRUMENTS;  Surgeon: Ailene Rud, MD;  Location: [facility];  Service: Urology;  Laterality: N/A;    Allergies  Allergen Reactions  . Bee Venom Swelling    SWELLING REACTION UNSPECIFIED     Outpatient Encounter Medications as of 07/08/2020  Medication Sig  . aspirin EC 81 MG tablet Take 81 mg by mouth every Monday, Wednesday, and Friday.   . Calcium Carbonate (CALCIUM 500 PO) Take 1 capsule by mouth daily. 3 times a week  . cholecalciferol (VITAMIN D) 1000 UNITS tablet Take 2,000 Units by mouth daily.   . Coenzyme Q10 200 MG capsule Take 300 mg by mouth 3 (  three) times a week.   Marland Kitchen EPINEPHrine 0.3 MG/0.3ML SOSY Inject .71ml into muscle 1 x dose. As needed  . Flaxseed, Linseed, (FLAX SEED OIL) 1000 MG CAPS Take 1,000 mg by mouth every evening.   . Glucosamine-Chondroitin-Vit D3 1500-1200-800 MG-MG-UNIT PACK Take 1 tablet by mouth daily.  . rosuvastatin (CRESTOR) 5 MG tablet Take one tablet by mouth every other day   No facility-administered encounter medications on file as of 07/08/2020.    Review of Systems:  Review of Systems  Review of Systems  Constitutional: Negative for activity change, appetite change, chills, diaphoresis, fatigue and fever.  HENT: Negative for mouth sores,  postnasal drip, rhinorrhea, sinus pain and sore throat.   Respiratory: Negative for apnea,  chest tightness, shortness of breath and wheezing.   Cardiovascular: Negative for chest pain, palpitations and leg swelling.  Gastrointestinal: Negative for abdominal distention, abdominal pain, constipation, diarrhea, nausea and vomiting.  Genitourinary: Negative for dysuria and frequency.  Musculoskeletal: Negative for arthralgias, joint swelling and myalgias.  Skin: Negative for rash.  Neurological: Negative for dizziness, syncope, weakness, light-headedness and numbness.  Psychiatric/Behavioral: Negative for behavioral problems, confusion and sleep disturbance.     Health Maintenance  Topic Date Due  . COVID-19 Vaccine (2 - Moderna 3-dose series) 06/24/2019  . INFLUENZA VACCINE  11/24/2019  . TETANUS/TDAP  06/17/2030  . PNA vac Low Risk Adult  Completed  . HPV VACCINES  Aged Out    Physical Exam: Vitals:   07/08/20 1459  BP: 120/70  Pulse: 75  Temp: (!) 97.2 F (36.2 C)  SpO2: 95%  Weight: 174 lb 8 oz (79.2 kg)  Height: 5\' 10"  (1.778 m)   Body mass index is 25.04 kg/m. Physical Exam  Constitutional: Oriented to person, place, and time. Well-developed and well-nourished.  HENT:  Head: Normocephalic.  Mouth/Throat: Oropharynx is clear and moist.  Eyes: Pupils are equal, round, and reactive to light.  Neck: Neck supple.  Cardiovascular: Normal rate and normal heart sounds.  No murmur heard. Pulmonary/Chest: Effort normal and breath sounds normal. No respiratory distress. No wheezes. Has few rales in his Right lower lung area Abdominal: Soft. Bowel sounds are normal. No distension. There is no tenderness. There is no rebound.  Musculoskeletal: No edema.  Lymphadenopathy: none Neurological: Alert and oriented to person, place, and time.  Skin: Skin is warm and dry.  Psychiatric: Normal mood and affect. Behavior is normal. Thought content normal.    Labs reviewed: Basic  Metabolic Panel: Recent Labs    11/21/19 0825 06/11/20 0810  NA 141 140  K 4.1 4.4  CL 103 104  CO2 28 30  GLUCOSE 100* 103*  BUN 21 21  CREATININE 0.82 0.88  CALCIUM 9.4 9.5  TSH 1.83  --    Liver Function Tests: Recent Labs    11/21/19 0825 06/11/20 0810  AST 16 18  ALT 16 20  BILITOT 1.9* 1.4*  PROT 6.4 6.5   No results for input(s): LIPASE, AMYLASE in the last 8760 hours. No results for input(s): AMMONIA in the last 8760 hours. CBC: Recent Labs    11/21/19 0825 06/11/20 0810  WBC 5.9 5.9  NEUTROABS 3,717 3,965  HGB 16.6 16.7  HCT 48.8 50.0  MCV 91.7 92.9  PLT 186 201   Lipid Panel: Recent Labs    11/21/19 0825 06/11/20 0810  CHOL 137 134  HDL 53 50  LDLCALC 71 70  TRIG 58 48  CHOLHDL 2.6 2.7   No results found for: HGBA1C  Procedures since last visit: DG Chest 2 View  Result Date: 06/19/2020 CLINICAL DATA:  Productive cough for 2 years in the mornings EXAM: CHEST - 2 VIEW COMPARISON:  08/02/2016 FINDINGS: Normal heart size, mediastinal contours, and pulmonary vascularity. Atherosclerotic calcification aorta. Mild chronic peribronchial thickening. No acute infiltrate, pleural effusion, or pneumothorax. Osseous structures unremarkable. IMPRESSION: Mild chronic bronchitic changes without infiltrate. Electronically Signed   By: Lavonia Dana M.D.   On: 06/19/2020 17:22    Assessment/Plan 1. Cough  - Ambulatory referral to Pulmonology  2. Simple chronic bronchitis (HCC) Referal to Pulmonary as requested by Patient  Does not want any meds right now   Other issues  Gilberts syndrome Stable   History of bilateral inguinal hernias Stable   Hyperlipidemia, unspecified hyperlipidemia type Continue Crestor - TSH; Future - Lipid panel; Future   Vitamin D deficiency Supplement . Elevated PSA S/P Number of Biopsy in the Past and they were negative   Labs/tests ordered:  * No order type specified * Next appt:  12/10/2020

## 2020-08-25 ENCOUNTER — Encounter: Payer: Self-pay | Admitting: Pulmonary Disease

## 2020-08-25 ENCOUNTER — Other Ambulatory Visit: Payer: Self-pay

## 2020-08-25 ENCOUNTER — Ambulatory Visit (INDEPENDENT_AMBULATORY_CARE_PROVIDER_SITE_OTHER): Payer: Medicare Other | Admitting: Pulmonary Disease

## 2020-08-25 DIAGNOSIS — R053 Chronic cough: Secondary | ICD-10-CM

## 2020-08-25 NOTE — Patient Instructions (Signed)
I heard crackles at the bottom of your lungs. Possibility includes damage to the airway/bronchiectasis or scar tissue in the lungs.  Will investigate further with high-resolution CT of the chest. Ambulatory saturation today

## 2020-08-25 NOTE — Assessment & Plan Note (Signed)
I heard crackles at the bottom of your lungs. Possibility includes bronchiectasis or occult pulmonary fibrosis.  With history of chronic cough I would favor the former.  He does not give any history of postnasal drip or reflux  Will investigate further with high-resolution CT of the chest. Regardless he does not seem to require treatment at this time.  I suggested OTC Mucinex or Robitussin-DM if cough should get worse

## 2020-08-25 NOTE — Addendum Note (Signed)
Addended by: Amado Coe on: 08/25/2020 03:12 PM   Modules accepted: Orders

## 2020-08-25 NOTE — Progress Notes (Signed)
Subjective:    Patient ID: Cameron Moh., male    DOB: [birth year], 87 y.o.   MRN: [id number]  HPI 87 year old remote smoker presents for evaluation of chronic cough. He is a remote smoker and quit in 1970, less than 10 pack years.  He worked and retired as Advice worker at [employer]  He lived in [place] since 1958 , retired at age 31 and moved to independent living in [place] 5 years ago. He reports chronic cough for about 2 years especially in the mornings, productive of minimal white phlegm, occasionally yellow.  He will cough a few more times during the day.  This appears to be more of a nuisance.  He has not tried any over-the-counter medications He denies associated dyspnea, in fact he has a very active lifestyle, walks 3 to 4 days a week and plays golf.  He used to run 5 to 6 miles a day until has knees gave out  Chest x-ray 06/19/2020 independently reviewed, no infiltrates, mention is made of bronchial thickening by radiologist Labs 05/2020 show mild polycythemia with hemoglobin of 16.7, no leukocytosis, normal renal function, total bilirubin 1.4 PMH - hyperlipidemia, Gilbert's syndrome and elevated PSA  Past Medical History:  Diagnosis Date  . Arthritis   . BPH (benign prostatic hyperplasia)   . Elevated PSA   . Erectile dysfunction   . Gilberts syndrome    elevated bile on liver tests in past  . Hyperlipidemia   . Inguinal hernia recurrent unilateral   . Nocturia   . Osteoarthrosis and allied disorders   . Other and unspecified hyperlipidemia   . Rotator cuff tear, left    partial   Past Surgical History:  Procedure Laterality Date  . CARPAL TUNNEL RELEASE Left 06/27/2013   Procedure: LEFT CARPAL TUNNEL RELEASE;  Surgeon: Cammie Sickle., MD;  Location: [facility];  Service: Orthopedics;  Laterality: Left;  . CATARACT EXTRACTION, BILATERAL  06/03/2015  . EYE SURGERY    . JOINT REPLACEMENT Left    knee  . KNEE ARTHROSCOPY     bilateral  .  POSTERIOR CERVICAL FUSION/FORAMINOTOMY N/A 11/30/2016   Procedure: Cervical one-Cervical two Posterior Instrumentation and Fusion;  Surgeon: Newman Pies, MD;  Location: [facility];  Service: Neurosurgery;  Laterality: N/A;  . right hand  1987   tendon repair   . TONSILLECTOMY     as child  . TOTAL KNEE ARTHROPLASTY  05/16/2012   Procedure: TOTAL KNEE ARTHROPLASTY;  Surgeon: Tobi Bastos, MD;  Location[facility];  Service: Orthopedics;  Laterality: Left;  . TRANSURETHRAL RESECTION OF PROSTATE N/A 05/23/2013   Procedure: TRANSURETHRAL RESECTION OF THE PROSTATE WITH GYRUS INSTRUMENTS;  Surgeon: Ailene Rud, MD;  Location[facility];  Service: Urology;  Laterality: N/A;    Allergies  Allergen Reactions  . Bee Venom Swelling    SWELLING REACTION UNSPECIFIED     Social History   Socioeconomic History  . Marital status: Married    Spouse name: Cameron [name]  . Number of children: 4  . Years of education: 16  . Highest education level: Bachelor's degree (e.g., BA, AB, BS)  Occupational History  . Occupation: Retired  Tobacco Use  . Smoking status: Former Smoker    Packs/day: 1.00    Years: 50.00    Pack years: 50.00    Types: Pipe, 5, Cigarettes    Start date: 04/25/1956    Quit date: 10/05/1982    Years since quitting: 37.9  .  Smokeless tobacco: Never Used  Vaping Use  . Vaping Use: Never used  Substance and Sexual Activity  . Alcohol use: Yes    Alcohol/week: 1.0 standard drink    Types: 1 Cans of beer per week    Comment: 1 beer qod, 1 glass wine 2x week  . Drug use: No  . Sexual activity: Yes    Birth control/protection: None  Other Topics Concern  . 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        Social History      Diet? N/a       Do you drink/eat things with caffeine? coffee      Marital status?         married                           What year were you married? 1961      Do you live in a house, apartment, assisted  living, condo, trailer, etc.? Independent Living      Is it one or more stories?  3 stories       How many persons live in your home? 2      Do you have any pets in your home? (please list) no       Highest level of education completed?  4 years college       Current or past profession:  [employer]      Do you exercise?                    yes                  Type & how often? Walk 1 hr 4 days a week play golf 3 times a week      Advanced Directives      Do you have a living will? yes      Do you have a DNR form?                                  If not, do you want to discuss one?      Do you have signed POA/HPOA for forms? yes   Functional Status      Do you have difficulty bathing or dressing yourself? No       Do you have difficulty preparing food or eating? No       Do you have difficulty managing your medications? No       Do you have difficulty managing your finances? No       Do you have difficulty affording your medications? No       Social Determinants of Radio broadcast assistant Strain: Not on file  Food Insecurity: Not on file  Transportation Needs: Not on file  Physical Activity: Not on file  Stress: Not on file  Social Connections: Not on file  Intimate Partner Violence: Not on file     Family History  Problem Relation Age of Onset  . COPD Mother   . Heart disease Father   . Heart attack Father 65       Died age 44  . Cancer Maternal Grandfather   . Heart attack Paternal Grandfather   . Hypertension Sister   . Hypertension Sister  Review of Systems Constitutional: negative for anorexia, fevers and sweats  Eyes: negative for irritation, redness and visual disturbance  Ears, nose, mouth, throat, and face: negative for earaches, epistaxis, nasal congestion and sore throat  Respiratory: negative for dyspnea on exertion, sputum and wheezing  Cardiovascular: negative for chest pain, dyspnea, lower extremity edema, orthopnea,  palpitations and syncope  Gastrointestinal: negative for abdominal pain, constipation, diarrhea, melena, nausea and vomiting  Genitourinary:negative for dysuria, frequency and hematuria  Hematologic/lymphatic: negative for bleeding, easy bruising and lymphadenopathy  Musculoskeletal:negative for arthralgias, muscle weakness and stiff joints  Neurological: negative for coordination problems, gait problems, headaches and weakness  Endocrine: negative for diabetic symptoms including polydipsia, polyuria and weight loss     Objective:   Physical Exam  Gen. Pleasant, well-nourished, in no distress, normal affect ENT - no pallor,icterus, no post nasal drip Neck: No JVD, no thyromegaly, no carotid bruits Lungs: no use of accessory muscles, no dullness to percussion, bibasal dry rales no rhonchi  Cardiovascular: Rhythm regular, heart sounds  normal, no murmurs or gallops, no peripheral edema Abdomen: soft and non-tender, no hepatosplenomegaly, BS normal. Musculoskeletal: No deformities, no cyanosis or clubbing Neuro:  alert, non focal       Assessment & Plan:

## 2020-08-31 ENCOUNTER — Other Ambulatory Visit: Payer: Self-pay | Admitting: Internal Medicine

## 2020-09-04 ENCOUNTER — Ambulatory Visit
Admission: RE | Admit: 2020-09-04 | Discharge: 2020-09-04 | Disposition: A | Discharge status: Home / Self Care | Payer: Medicare Other | Source: Ambulatory Visit | Attending: Pulmonary Disease | Admitting: Pulmonary Disease

## 2020-09-04 DIAGNOSIS — R053 Chronic cough: Secondary | ICD-10-CM | POA: Diagnosis not present

## 2020-09-08 NOTE — Progress Notes (Signed)
Called and went over CT results per Dr Alva with patient. All questions answered and patient expressed full understanding. Nothing further needed at this time.

## 2020-10-07 DIAGNOSIS — Z23 Encounter for immunization: Secondary | ICD-10-CM | POA: Diagnosis not present

## 2020-10-21 ENCOUNTER — Encounter: Payer: Self-pay | Admitting: Family Medicine

## 2020-10-21 ENCOUNTER — Telehealth: Payer: Self-pay | Admitting: Internal Medicine

## 2020-10-21 ENCOUNTER — Other Ambulatory Visit: Payer: Self-pay

## 2020-10-21 ENCOUNTER — Non-Acute Institutional Stay (INDEPENDENT_AMBULATORY_CARE_PROVIDER_SITE_OTHER): Payer: Medicare Other | Admitting: Family Medicine

## 2020-10-21 VITALS — BP 120/60 | HR 75 | Temp 97.3°F | Ht 70.0 in | Wt 173.0 lb

## 2020-10-21 DIAGNOSIS — H6692 Otitis media, unspecified, left ear: Secondary | ICD-10-CM

## 2020-10-21 MED ORDER — AMOXICILLIN 500 MG PO CAPS: 500.0000 mg | ORAL_CAPSULE | Freq: Three times a day (TID) | ORAL | 0 refills | Status: AC

## 2020-10-21 MED ORDER — AMOXICILLIN 500 MG PO CAPS: 500.0000 mg | ORAL_CAPSULE | Freq: Three times a day (TID) | ORAL | 0 refills | Status: DC

## 2020-10-21 NOTE — Progress Notes (Signed)
m   Provider:  Alain Honey, MD  Careteam: Patient Care Team: Virgie Dad, MD as PCP - General (Internal Medicine) Carolan Clines, MD (Inactive) as Consulting Physician (Urology) Alphonsa Overall, MD as Consulting Physician (General Surgery) Harriett Sine, MD as Consulting Physician (Dermatology) Steffanie Rainwater, DPM as Consulting Physician (Podiatry)  PLACE OF SERVICE:  [place]  Advanced Directive information    Allergies  Allergen Reactions   Bee Venom Swelling    SWELLING REACTION UNSPECIFIED     Chief Complaint  Patient presents with   Acute Visit    Patient presents today for ear pain for 3 days now. He reports its only left ear and has taking tylenol only for the pain.     HPI: Patient is a 87 y.o. male patient complains of 3-day history of left-sided earache.  He denies hearing loss or fullness Tylenol helps for discomfort  Review of Systems:  Review of Systems  HENT:  Positive for ear pain.   All other systems reviewed and are negative.  Past Medical History:  Diagnosis Date   Arthritis    BPH (benign prostatic hyperplasia)    Elevated PSA    Erectile dysfunction    Gilberts syndrome    elevated bile on liver tests in past   Hyperlipidemia    Inguinal hernia recurrent unilateral    Nocturia    Osteoarthrosis and allied disorders    Other and unspecified hyperlipidemia    Rotator cuff tear, left    partial   Past Surgical History:  Procedure Laterality Date   CARPAL TUNNEL RELEASE Left 06/27/2013   Procedure: LEFT CARPAL TUNNEL RELEASE;  Surgeon: Cammie Sickle., MD;  Location: [place];  Service: Orthopedics;  Laterality: Left;   CATARACT EXTRACTION, BILATERAL  06/03/2015   EYE SURGERY     JOINT REPLACEMENT Left    knee   KNEE ARTHROSCOPY     bilateral   POSTERIOR CERVICAL FUSION/FORAMINOTOMY N/A 11/30/2016   Procedure: Cervical one-Cervical two Posterior Instrumentation and Fusion;  Surgeon: Newman Pies, MD;   Location: [place];  Service: Neurosurgery;  Laterality: N/A;   right hand  1987   tendon repair    TONSILLECTOMY     as child   TOTAL KNEE ARTHROPLASTY  05/16/2012   Procedure: TOTAL KNEE ARTHROPLASTY;  Surgeon: Tobi Bastos, MD;  Location: [facility];  Service: Orthopedics;  Laterality: Left;   TRANSURETHRAL RESECTION OF PROSTATE N/A 05/23/2013   Procedure: TRANSURETHRAL RESECTION OF THE PROSTATE WITH GYRUS INSTRUMENTS;  Surgeon: Ailene Rud, MD;  Location: [facility];  Service: Urology;  Laterality: N/A;   Social History:   reports that he quit smoking about 38 years ago. His smoking use included pipe, cigars, and cigarettes. He started smoking about 64 years ago. He has a 50.00 pack-year smoking history. He has never used smokeless tobacco. He reports current alcohol use of about 1.0 standard drink of alcohol per week. He reports that he does not use drugs.  Family History  Problem Relation Age of Onset   COPD Mother    Heart disease Father    Heart attack Father 56       Died age 17   Cancer Maternal Grandfather    Heart attack Paternal Grandfather    Hypertension Sister    Hypertension Sister     Medications: Patient's Medications  New Prescriptions   No medications on file  Previous Medications   ASPIRIN EC 81 MG TABLET    Take  81 mg by mouth every Monday, Wednesday, and Friday.    CALCIUM CARBONATE (CALCIUM 500 PO)    Take 1 capsule by mouth daily. 3 times a week   CHOLECALCIFEROL (VITAMIN D) 1000 UNITS TABLET    Take 2,000 Units by mouth daily.    COENZYME Q10 200 MG CAPSULE    Take 300 mg by mouth 3 (three) times a week.    EPINEPHRINE 0.3 MG/0.3ML SOSY    Inject .54ml into muscle 1 x dose. As needed   FLAXSEED, LINSEED, (FLAX SEED OIL) 1000 MG CAPS    Take 1,000 mg by mouth every evening.    GLUCOSAMINE-CHONDROITIN-VIT D3 1500-1200-800 MG-MG-UNIT PACK    Take 1 tablet by mouth daily.   ROSUVASTATIN (CRESTOR) 5 MG TABLET    TAKE TAKE ONE TABLET TABLET BY MOUTH EVERY  OTHER DAY  Modified Medications   No medications on file  Discontinued Medications   No medications on file    Physical Exam:  Vitals:   10/21/20 1317  BP: 120/60  Pulse: 75  Temp: (!) 97.3 F (36.3 C)  TempSrc: Temporal  SpO2: 98%  Weight: 173 lb (78.5 kg)  Height: 5\' 10"  (1.778 m)   Body mass index is 24.82 kg/m. Wt Readings from Last 3 Encounters:  10/21/20 173 lb (78.5 kg)  08/25/20 172 lb (78 kg)  07/08/20 174 lb 8 oz (79.2 kg)    Physical Exam Vitals and nursing note reviewed.  Constitutional:      Appearance: Normal appearance.  HENT:     Head: Normocephalic.     Ears:     Comments: Left tympanic membrane is dull and does not move with Valsalva maneuver Neurological:     Mental Status: He is alert.    Labs reviewed: Basic Metabolic Panel: Recent Labs    11/21/19 0825 06/11/20 0810  NA 141 140  K 4.1 4.4  CL 103 104  CO2 28 30  GLUCOSE 100* 103*  BUN 21 21  CREATININE 0.82 0.88  CALCIUM 9.4 9.5  TSH 1.83  --    Liver Function Tests: Recent Labs    11/21/19 0825 06/11/20 0810  AST 16 18  ALT 16 20  BILITOT 1.9* 1.4*  PROT 6.4 6.5   No results for input(s): LIPASE, AMYLASE in the last 8760 hours. No results for input(s): AMMONIA in the last 8760 hours. CBC: Recent Labs    11/21/19 0825 06/11/20 0810  WBC 5.9 5.9  NEUTROABS 3,717 3,965  HGB 16.6 16.7  HCT 48.8 50.0  MCV 91.7 92.9  PLT 186 201   Lipid Panel: Recent Labs    11/21/19 0825 06/11/20 0810  CHOL 137 134  HDL 53 50  LDLCALC 71 70  TRIG 58 48  CHOLHDL 2.6 2.7   TSH: Recent Labs    11/21/19 0825  TSH 1.83   A1C: No results found for: HGBA1C   Assessment/Plan   1. Otitis of left ear Even though the tympanic membrane is not inflamed, in my experience when there is discomfort associated with the effusion there is likely some early infection.  We will treat with amoxicillin.  Also recommended continuation of ear exercise (Valsalva) and Flonase -  amoxicillin (AMOXIL) 500 MG capsule; Take 1 capsule (500 mg total) by mouth 3 (three) times daily for 10 days.  Dispense: 30 capsule; Refill: 0  Alain Honey, MD [facility] [phone]

## 2020-10-21 NOTE — Telephone Encounter (Signed)
Pt called back to see if appt was available. We were able to sch appt with Dr Sabra Heck at [facility] clinic 10/21/20.  Thanks, Vilinda Blanks

## 2020-10-21 NOTE — Telephone Encounter (Signed)
Pt called wanting [name] to look at his left ear pain, started 2 days ago. More painful yday.  He's IL & I explained Dr Steve Rattler schedule at clinic is full today 10/21/20.  Please advise Vilinda Blanks

## 2020-11-11 ENCOUNTER — Encounter: Payer: Medicare Other | Admitting: Family Medicine

## 2020-11-25 ENCOUNTER — Other Ambulatory Visit: Payer: Self-pay | Admitting: Internal Medicine

## 2020-11-26 ENCOUNTER — Other Ambulatory Visit: Payer: Self-pay

## 2020-11-26 ENCOUNTER — Encounter: Payer: Self-pay | Admitting: Adult Health

## 2020-11-26 ENCOUNTER — Ambulatory Visit (INDEPENDENT_AMBULATORY_CARE_PROVIDER_SITE_OTHER): Payer: Medicare Other | Admitting: Adult Health

## 2020-11-26 DIAGNOSIS — R053 Chronic cough: Secondary | ICD-10-CM | POA: Diagnosis not present

## 2020-11-26 NOTE — Assessment & Plan Note (Signed)
Chronic cough-CT chest shows some minimal bibasilar scarring. Patient remains very active with no significant associated shortness of breath or activity intolerance. Will add in Mucinex DM liquid as needed.  Continue to monitor.  Plan  Patient Instructions  Mucinex DM liquid 2 tsp Twice daily  for 3 days then As needed   Activity as tolerated.  Follow up with Dr. Elsworth Soho  in 1 year and As needed

## 2020-11-26 NOTE — Progress Notes (Signed)
$'@Patient'B$  ID: [name], male    DOB: 04/27/1932, 87 y.o.   MRN: [id number]  Chief Complaint  Patient presents with   Follow-up    Referring provider: Virgie Dad, MD  HPI: 87 year old male with a remote smoking history seen for pulmonary consult Aug 25, 2020 for chronic cough x2 years. 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 Labs February 2022 showed mild polycythemia with hemoglobin of 16.7, no leukocytosis, normal renal function. Past medical history hyperlipidemia, Gilbert's syndrome and elevated PSA   11/26/2020 Follow up ; Chronic Cough  Patient returns for a 69-monthfollow-up.  Patient was seen last visit for a chronic cough over the last 2 years.  Patient was set up for high-resolution CT chest that showed minimal irregular peripheral interstitial opacity and septal thickening in the dependent bilateral lungs. We discussed his CT results.  Patient says he is doing well.  He says his cough is decreased some since last visit.  He is really not using anything for cough.  He denies any significant postnasal drainage.  No reflux.  He denies any hemoptysis discolored mucus fever. Patient says he is very active.  Walks most days.  Plays golf 2 times a week.      Allergies  Allergen Reactions   Bee Venom Swelling    SWELLING REACTION UNSPECIFIED     Immunization History  Administered Date(s) Administered   Influenza Split 01/31/2013   Influenza Whole 12/24/2009   Influenza, High Dose Seasonal PF 03/01/2016, 02/01/2017, 01/19/2018   Influenza,inj,Quad PF,6+ Mos 02/13/2014, 01/21/2015   Influenza-Unspecified 01/25/2019   Moderna Sars-Covid-2 Vaccination 05/27/2019, 06/27/2019   PPD Test 01/14/2014   Pneumococcal Conjugate-13 04/11/2013   Pneumococcal Polysaccharide-23 04/25/2004   Tdap 12/24/2009, 06/17/2020    Zoster Recombinat (Shingrix) 03/06/2017, 01/29/2018   Zoster, Live 01/24/2007    Past Medical History:  Diagnosis Date   Arthritis    BPH (benign prostatic hyperplasia)    Elevated PSA    Erectile dysfunction    Gilberts syndrome    elevated bile on liver tests in past   Hyperlipidemia    Inguinal hernia recurrent unilateral    Nocturia    Osteoarthrosis and allied disorders    Other and unspecified hyperlipidemia    Rotator cuff tear, left    partial    Tobacco History: Social History   Tobacco Use  Smoking Status Former   Packs/day: 1.00   Years: 50.00   Pack years: 50.00   Types: Pipe, Cigars, Cigarettes   Start date: 04/25/1956   Quit date: 10/05/1982   Years since quitting: 38.1  Smokeless Tobacco Never   Counseling given: Not Answered   Outpatient Medications Prior to Visit  Medication Sig Dispense Refill   aspirin EC 81 MG tablet Take 81 mg by mouth every Monday, Wednesday, and Friday.      Calcium Carbonate (CALCIUM 500 PO) Take 1 capsule by mouth daily. 3 times a week     cholecalciferol (VITAMIN D) 1000 UNITS tablet Take 2,000 Units by mouth daily.      Coenzyme Q10 200 MG capsule Take 300 mg by mouth 3 (three) times a week.      EPINEPHrine 0.3 MG/0.3ML SOSY Inject .335minto muscle 1 x dose. As needed 1 each 1   Flaxseed, Linseed, (FLAX SEED OIL) 1000 MG CAPS Take 1,000 mg by  mouth every evening.      Glucosamine-Chondroitin-Vit D3 1500-1200-800 MG-MG-UNIT PACK Take 1 tablet by mouth daily.     rosuvastatin (CRESTOR) 5 MG tablet TAKE ONE TABLET BY MOUTH EVERY OTHER DAY 45 tablet 1   No facility-administered medications prior to visit.     Review of Systems:   Constitutional:   No  weight loss, night sweats,  Fevers, chills, fatigue, or  lassitude.  HEENT:   No headaches,  Difficulty swallowing,  Tooth/dental problems, or  Sore throat,                No sneezing, itching, ear ache, nasal congestion, post nasal drip,   CV:  No chest pain,   Orthopnea, PND, swelling in lower extremities, anasarca, dizziness, palpitations, syncope.   GI  No heartburn, indigestion, abdominal pain, nausea, vomiting, diarrhea, change in bowel habits, loss of appetite, bloody stools.   Resp: No shortness of breath with exertion or at rest.  No excess mucus, no productive cough,    No change in color of mucus.  No wheezing.  No chest wall deformity  Skin: no rash or lesions.  GU: no dysuria, change in color of urine, no urgency or frequency.  No flank pain, no hematuria   MS:  No joint pain or swelling.  No decreased range of motion.  No back pain.    Physical Exam  BP 110/68 (BP Location: Left Arm, Patient Position: Sitting, Cuff Size: Normal)   Pulse 64   Temp (!) 97.3 F (36.3 C) (Oral)   Ht '5\' 11"'$  (1.803 m)   Wt 171 lb 3.2 oz (77.7 kg)   SpO2 97%   BMI 23.88 kg/m   GEN: A/Ox3; pleasant , NAD, well nourished    HEENT:  [place]/AT, , NOSE-clear, THROAT-clear, no lesions, no postnasal drip or exudate noted.   NECK:  Supple w/ fair ROM; no JVD; normal carotid impulses w/o bruits; no thyromegaly or nodules palpated; no lymphadenopathy.    RESP faint bibasilar crackles no accessory muscle use, no dullness to percussion  CARD:  RRR, no m/r/g, no peripheral edema, pulses intact, no cyanosis or clubbing.  GI:   Soft & nt; nml bowel sounds; no organomegaly or masses detected.   Musco: Warm bil, no deformities or joint swelling noted.   Neuro: alert, no focal deficits noted.    Skin: Warm, no lesions or rashes    Lab Results:  CBC    Component Value Date/Time   WBC 5.9 06/11/2020 0810   RBC 5.38 06/11/2020 0810   HGB 16.7 06/11/2020 0810   HGB 16.7 03/26/2019 0832   HCT 50.0 06/11/2020 0810   HCT 50.6 03/26/2019 0832   PLT 201 06/11/2020 0810   PLT 191 03/26/2019 0832   MCV 92.9 06/11/2020 0810   MCV 94 03/26/2019 0832   MCH 31.0 06/11/2020 0810   MCHC 33.4 06/11/2020 0810   RDW 12.1 06/11/2020 0810   RDW 12.3 03/26/2019  0832   LYMPHSABS 1,192 06/11/2020 0810   LYMPHSABS 1.4 03/26/2019 0832   EOSABS 142 06/11/2020 0810   EOSABS 0.1 03/26/2019 0832   BASOSABS 41 06/11/2020 0810   BASOSABS 0.1 03/26/2019 0832    BMET    Component Value Date/Time   NA 140 06/11/2020 0810   NA 142 03/26/2019 0832   K 4.4 06/11/2020 0810   CL 104 06/11/2020 0810   CO2 30 06/11/2020 0810   GLUCOSE 103 (H) 06/11/2020 0810   BUN 21 06/11/2020 0810   BUN  22 03/26/2019 0832   CREATININE 0.88 06/11/2020 0810   CALCIUM 9.5 06/11/2020 0810   GFRNONAA 78 06/11/2020 0810   GFRAA 90 06/11/2020 0810    BNP No results found for: BNP  ProBNP No results found for: PROBNP  Imaging: No results found.    No flowsheet data found.  No results found for: NITRICOXIDE      Assessment & Plan:   Chronic cough Chronic cough-CT chest shows some minimal bibasilar scarring. Patient remains very active with no significant associated shortness of breath or activity intolerance. Will add in Mucinex DM liquid as needed.  Continue to monitor.  Plan  Patient Instructions  Mucinex DM liquid 2 tsp Twice daily  for 3 days then As needed   Activity as tolerated.  Follow up with Dr. Elsworth Soho  in 1 year and As needed         I spent   22 minutes dedicated to the care of this patient on the date of this encounter to include pre-visit review of records, face-to-face time with the patient discussing conditions above, post visit ordering of testing, clinical documentation with the electronic health record, making appropriate referrals as documented, and communicating necessary findings to members of the patients care team.    Rexene Edison, NP 11/26/2020

## 2020-11-26 NOTE — Patient Instructions (Signed)
Mucinex DM liquid 2 tsp Twice daily  for 3 days then As needed   Activity as tolerated.  Follow up with Dr. Elsworth Soho  in 1 year and As needed

## 2020-12-09 DIAGNOSIS — R059 Cough, unspecified: Secondary | ICD-10-CM | POA: Diagnosis not present

## 2020-12-09 DIAGNOSIS — E785 Hyperlipidemia, unspecified: Secondary | ICD-10-CM | POA: Diagnosis not present

## 2020-12-10 ENCOUNTER — Other Ambulatory Visit: Payer: Self-pay

## 2020-12-10 DIAGNOSIS — R059 Cough, unspecified: Secondary | ICD-10-CM

## 2020-12-10 DIAGNOSIS — E785 Hyperlipidemia, unspecified: Secondary | ICD-10-CM

## 2020-12-11 LAB — CBC WITH DIFFERENTIAL/PLATELET
Absolute Monocytes: 628 cells/uL (ref 200–950)
Basophils Absolute: 48 cells/uL (ref 0–200)
Basophils Relative: 0.7 %
Eosinophils Absolute: 152 cells/uL (ref 15–500)
Eosinophils Relative: 2.2 %
HCT: 49.6 % (ref 38.5–50.0)
Hemoglobin: 16.5 g/dL (ref 13.2–17.1)
Lymphs Abs: 1642 cells/uL (ref 850–3900)
MCH: 31 pg (ref 27.0–33.0)
MCHC: 33.3 g/dL (ref 32.0–36.0)
MCV: 93.2 fL (ref 80.0–100.0)
MPV: 11 fL (ref 7.5–12.5)
Monocytes Relative: 9.1 %
Neutro Abs: 4430 cells/uL (ref 1500–7800)
Neutrophils Relative %: 64.2 %
Platelets: 210 10*3/uL (ref 140–400)
RBC: 5.32 10*6/uL (ref 4.20–5.80)
RDW: 12 % (ref 11.0–15.0)
Total Lymphocyte: 23.8 %
WBC: 6.9 10*3/uL (ref 3.8–10.8)

## 2020-12-11 LAB — COMPLETE METABOLIC PANEL WITH GFR
AG Ratio: 2.2 (calc) (ref 1.0–2.5)
ALT: 17 U/L (ref 9–46)
AST: 16 U/L (ref 10–35)
Albumin: 4.6 g/dL (ref 3.6–5.1)
Alkaline phosphatase (APISO): 76 U/L (ref 35–144)
BUN: 21 mg/dL (ref 7–25)
CO2: 28 mmol/L (ref 20–32)
Calcium: 9.7 mg/dL (ref 8.6–10.3)
Chloride: 103 mmol/L (ref 98–110)
Creat: 0.79 mg/dL (ref 0.70–1.22)
Globulin: 2.1 g/dL (calc) (ref 1.9–3.7)
Glucose, Bld: 91 mg/dL (ref 65–99)
Potassium: 4.3 mmol/L (ref 3.5–5.3)
Sodium: 142 mmol/L (ref 135–146)
Total Bilirubin: 1.6 mg/dL — ABNORMAL HIGH (ref 0.2–1.2)
Total Protein: 6.7 g/dL (ref 6.1–8.1)
eGFR: 87 mL/min/{1.73_m2} (ref >=60)

## 2020-12-11 LAB — LIPID PANEL
Cholesterol: 142 mg/dL (ref <200)
HDL: 53 mg/dL (ref >=40)
LDL Cholesterol (Calc): 75 mg/dL (calc)
Non-HDL Cholesterol (Calc): 89 mg/dL (calc) (ref <130)
Total CHOL/HDL Ratio: 2.7 (calc) (ref <5.0)
Triglycerides: 55 mg/dL (ref <150)

## 2020-12-11 LAB — TSH: TSH: 1.87 mIU/L (ref 0.40–4.50)

## 2020-12-16 ENCOUNTER — Encounter: Payer: Self-pay | Admitting: Internal Medicine

## 2020-12-16 ENCOUNTER — Non-Acute Institutional Stay: Payer: Medicare Other | Admitting: Internal Medicine

## 2020-12-16 ENCOUNTER — Other Ambulatory Visit: Payer: Self-pay

## 2020-12-16 ENCOUNTER — Other Ambulatory Visit: Payer: Self-pay | Admitting: Internal Medicine

## 2020-12-16 VITALS — BP 142/74 | HR 72 | Temp 98.3°F | Resp 16 | Ht 71.0 in | Wt 168.8 lb

## 2020-12-16 DIAGNOSIS — R059 Cough, unspecified: Secondary | ICD-10-CM

## 2020-12-16 DIAGNOSIS — E785 Hyperlipidemia, unspecified: Secondary | ICD-10-CM

## 2020-12-16 DIAGNOSIS — Z8719 Personal history of other diseases of the digestive system: Secondary | ICD-10-CM

## 2020-12-16 DIAGNOSIS — J410 Simple chronic bronchitis: Secondary | ICD-10-CM

## 2020-12-16 DIAGNOSIS — R972 Elevated prostate specific antigen [PSA]: Secondary | ICD-10-CM

## 2020-12-17 NOTE — Progress Notes (Signed)
Location:  [place] of Service:  Clinic (12)  Provider:   Code Status:  Goals of Care:  Advanced Directives 12/16/2020  Does Patient Have a Medical Advance Directive? No  Type of Advance Directive -  Does patient want to make changes to medical advance directive? -  Copy of [place] in Chart? -  Would patient like information on creating a medical advance directive? No - Patient declined  Pre-existing out of facility DNR order (yellow form or pink MOST form) -     Chief Complaint  Patient presents with   Medical Management of Chronic Issues    6 month follow up and discuss labs    HPI: Patient is a 87 y.o. male seen today for medical management of chronic diseases.    Has h/o  Hyperlipidemia Takes Crestor 3/week LDL less then 100 Gilbert Syndrome Mild Elevated Indirect Bilirubin H/o Elevated PSA Has had number of Biopsy and they have been Negative  Chronic Productive cough Had CT scan of Lungs done by Pulmonary which showed peripheral interstitial opacity and septal thickening in the dependent bilateral lung bases He is not on any new meds for that. He says he feels fine now and cough  is better Continues to stay active No SOB No Chest pain     Past Medical History:  Diagnosis Date   Arthritis    BPH (benign prostatic hyperplasia)    Elevated PSA    Erectile dysfunction    Gilberts syndrome    elevated bile on liver tests in past   Hyperlipidemia    Inguinal hernia recurrent unilateral    Nocturia    Osteoarthrosis and allied disorders    Other and unspecified hyperlipidemia    Rotator cuff tear, left    partial    Past Surgical History:  Procedure Laterality Date   CARPAL TUNNEL RELEASE Left 06/27/2013   Procedure: LEFT CARPAL TUNNEL RELEASE;  Surgeon: Cammie Sickle., MD;  Location: [place];  Service: Orthopedics;  Laterality: Left;   CATARACT EXTRACTION, BILATERAL  06/03/2015   EYE SURGERY      JOINT REPLACEMENT Left    knee   KNEE ARTHROSCOPY     bilateral   POSTERIOR CERVICAL FUSION/FORAMINOTOMY N/A 11/30/2016   Procedure: Cervical one-Cervical two Posterior Instrumentation and Fusion;  Surgeon: Newman Pies, MD;  Location: [place];  Service: Neurosurgery;  Laterality: N/A;   right hand  1987   tendon repair    TONSILLECTOMY     as child   TOTAL KNEE ARTHROPLASTY  05/16/2012   Procedure: TOTAL KNEE ARTHROPLASTY;  Surgeon: Tobi Bastos, MD;  Location: [facility];  Service: Orthopedics;  Laterality: Left;   TRANSURETHRAL RESECTION OF PROSTATE N/A 05/23/2013   Procedure: TRANSURETHRAL RESECTION OF THE PROSTATE WITH GYRUS INSTRUMENTS;  Surgeon: Ailene Rud, MD;  Location: [facility];  Service: Urology;  Laterality: N/A;    Allergies  Allergen Reactions   Bee Venom Swelling    SWELLING REACTION UNSPECIFIED     Outpatient Encounter Medications as of 12/16/2020  Medication Sig   aspirin EC 81 MG tablet Take 81 mg by mouth every Monday, Wednesday, and Friday.    Calcium Carbonate (CALCIUM 500 PO) Take 1 capsule by mouth daily. 3 times a week   cholecalciferol (VITAMIN D) 1000 UNITS tablet Take 2,000 Units by mouth daily.    Coenzyme Q10 200 MG capsule Take 300 mg by mouth 3 (three) times a week.  Flaxseed, Linseed, (FLAX SEED OIL) 1000 MG CAPS Take 1,000 mg by mouth every evening.    Glucosamine-Chondroitin-Vit D3 1500-1200-800 MG-MG-UNIT PACK Take 1 tablet by mouth daily.   rosuvastatin (CRESTOR) 5 MG tablet TAKE ONE TABLET BY MOUTH EVERY OTHER DAY   No facility-administered encounter medications on file as of 12/16/2020.    Review of Systems:  Review of Systems Review of Systems  Constitutional: Negative for activity change, appetite change, chills, diaphoresis, fatigue and fever.  HENT: Negative for mouth sores, postnasal drip, rhinorrhea, sinus pain and sore throat.   Respiratory: Negative for apnea, cough, chest tightness, shortness of breath and wheezing.    Cardiovascular: Negative for chest pain, palpitations and leg swelling.  Gastrointestinal: Negative for abdominal distention, abdominal pain, constipation, diarrhea, nausea and vomiting.  Genitourinary: Negative for dysuria and frequency.  Musculoskeletal: Negative for arthralgias, joint swelling and myalgias.  Skin: Negative for rash.  Neurological: Negative for dizziness, syncope, weakness, light-headedness and numbness.  Psychiatric/Behavioral: Negative for behavioral problems, confusion and sleep disturbance.    Health Maintenance  Topic Date Due   COVID-19 Vaccine (3 - Booster for Moderna series) 11/27/2019   INFLUENZA VACCINE  11/23/2020   TETANUS/TDAP  06/17/2030   PNA vac Low Risk Adult  Completed   Zoster Vaccines- Shingrix  Completed   HPV VACCINES  Aged Out    Physical Exam: Vitals:   12/16/20 1456  BP: (!) 142/74  Pulse: 72  Resp: 16  Temp: 98.3 F (36.8 C)  SpO2: 97%  Weight: 168 lb 12.8 oz (76.6 kg)  Height: '5\' 11"'$  (1.803 m)   Body mass index is 23.54 kg/m. Physical Exam Constitutional: Oriented to person, place, and time. Well-developed and well-nourished.  HENT:  Head: Normocephalic.  Mouth/Throat: Oropharynx is clear and moist.  Eyes: Pupils are equal, round, and reactive to light.  Neck: Neck supple.  Cardiovascular: Normal rate and normal heart sounds.  No murmur heard. Pulmonary/Chest: [place] normal and breath sounds normal. No respiratory distress. No wheezes. She has no rales.  Abdominal: Soft. Bowel sounds are normal. No distension. There is no tenderness. There is no rebound.  Musculoskeletal: No edema.  Lymphadenopathy: none Neurological: Alert and oriented to person, place, and time.  Skin: Skin is warm and dry.  Psychiatric: Normal mood and affect. Behavior is normal. Thought content normal.   Labs reviewed: Basic Metabolic Panel: Recent Labs    06/11/20 0810 12/09/20 0850  NA 140 142  K 4.4 4.3  CL 104 103  CO2 30 28  GLUCOSE  103* 91  BUN 21 21  CREATININE 0.88 0.79  CALCIUM 9.5 9.7  TSH  --  1.87   Liver Function Tests: Recent Labs    06/11/20 0810 12/09/20 0850  AST 18 16  ALT 20 17  BILITOT 1.4* 1.6*  PROT 6.5 6.7   No results for input(s): LIPASE, AMYLASE in the last 8760 hours. No results for input(s): AMMONIA in the last 8760 hours. CBC: Recent Labs    06/11/20 0810 12/09/20 0850  WBC 5.9 6.9  NEUTROABS 3,965 4,430  HGB 16.7 16.5  HCT 50.0 49.6  MCV 92.9 93.2  PLT 201 210   Lipid Panel: Recent Labs    06/11/20 0810 12/09/20 0850  CHOL 134 142  HDL 50 53  LDLCALC 70 75  TRIG 48 55  CHOLHDL 2.7 2.7   No results found for: HGBA1C  Procedures since last visit: No results found.  Assessment/Plan Simple chronic bronchitis (HCC) with Cough Not on any New  Med Will follow with Pulmonary PRN    Gilberts syndrome Stable labs History of bilateral inguinal hernias Stable  Hyperlipidemia, unspecified hyperlipidemia type LDL less then 100 On Statin Elevated PSA S/P Number of Biopsy in the Past and they were negative     Labs/tests ordered:  * No order type specified * Next appt:  12/23/2020

## 2020-12-23 ENCOUNTER — Encounter: Payer: Medicare Other | Admitting: Internal Medicine

## 2021-01-13 DIAGNOSIS — Z23 Encounter for immunization: Secondary | ICD-10-CM | POA: Diagnosis not present

## 2021-02-18 DIAGNOSIS — Z23 Encounter for immunization: Secondary | ICD-10-CM | POA: Diagnosis not present

## 2021-05-05 ENCOUNTER — Other Ambulatory Visit: Payer: Self-pay | Admitting: Internal Medicine

## 2021-06-02 ENCOUNTER — Other Ambulatory Visit: Payer: Self-pay

## 2021-06-02 DIAGNOSIS — E785 Hyperlipidemia, unspecified: Secondary | ICD-10-CM

## 2021-06-02 DIAGNOSIS — D62 Acute posthemorrhagic anemia: Secondary | ICD-10-CM

## 2021-06-03 ENCOUNTER — Other Ambulatory Visit: Payer: Self-pay

## 2021-06-03 DIAGNOSIS — D62 Acute posthemorrhagic anemia: Secondary | ICD-10-CM | POA: Diagnosis not present

## 2021-06-03 DIAGNOSIS — E785 Hyperlipidemia, unspecified: Secondary | ICD-10-CM | POA: Diagnosis not present

## 2021-06-03 LAB — LIPID PANEL
Cholesterol: 142 (ref 0–200)
HDL: 49 (ref 35–70)
LDL Cholesterol: 81
LDl/HDL Ratio: 2.9
Triglycerides: 50 (ref 40–160)

## 2021-06-03 LAB — CBC AND DIFFERENTIAL
HCT: 47 (ref 41–53)
Hemoglobin: 15.7 (ref 13.5–17.5)
Neutrophils Absolute: 3631
Platelets: 189 (ref 150–399)
WBC: 5.8

## 2021-06-03 LAB — BASIC METABOLIC PANEL
BUN: 22 — AB (ref 4–21)
CO2: 28 — AB (ref 13–22)
Chloride: 103 (ref 99–108)
Creatinine: 0.9 (ref 0.6–1.3)
Glucose: 95
Potassium: 4.4 (ref 3.4–5.3)
Sodium: 141 (ref 137–147)

## 2021-06-03 LAB — COMPREHENSIVE METABOLIC PANEL
Albumin: 4.1 (ref 3.5–5.0)
Calcium: 9.5 (ref 8.7–10.7)
Globulin: 2.2

## 2021-06-03 LAB — HEPATIC FUNCTION PANEL
ALT: 15 (ref 10–40)
AST: 17 (ref 14–40)
Alkaline Phosphatase: 64 (ref 25–125)
Bilirubin, Total: 1.6

## 2021-06-03 LAB — CBC: RBC: 5.07 (ref 3.87–5.11)

## 2021-06-09 ENCOUNTER — Other Ambulatory Visit: Payer: Self-pay

## 2021-06-09 ENCOUNTER — Encounter: Payer: Self-pay | Admitting: Internal Medicine

## 2021-06-09 ENCOUNTER — Non-Acute Institutional Stay: Payer: Medicare Other | Admitting: Internal Medicine

## 2021-06-09 VITALS — BP 118/67 | HR 65 | Temp 97.4°F | Ht 70.5 in | Wt 172.0 lb

## 2021-06-09 DIAGNOSIS — R972 Elevated prostate specific antigen [PSA]: Secondary | ICD-10-CM

## 2021-06-09 DIAGNOSIS — E785 Hyperlipidemia, unspecified: Secondary | ICD-10-CM | POA: Diagnosis not present

## 2021-06-09 DIAGNOSIS — J410 Simple chronic bronchitis: Secondary | ICD-10-CM

## 2021-06-09 NOTE — Progress Notes (Signed)
Location:  [place] of Service:  Clinic (12)  Provider:   Code Status:  Goals of Care:  Advanced Directives 06/09/2021  Does Patient Have a Medical Advance Directive? Yes  Type of Paramedic of [place];Living will  Does patient want to make changes to medical advance directive? No - Patient declined  Copy of [place] in Chart? -  Would patient like information on creating a medical advance directive? -  Pre-existing out of facility DNR order (yellow form or pink MOST form) -     Chief Complaint  Patient presents with   Medical Management of Chronic Issues    Patient returns to the clinic for his 6 month follow up. No concerns.     HPI: Patient is a 88 y.o. male seen today for medical management of chronic diseases.   Has h/o  Hyperlipidemia Takes Crestor 3/week LDL less then 100 Gilbert Syndrome Mild Elevated Indirect Bilirubin H/o Elevated PSA Has had number of Biopsy and they have been Negative   Chronic Productive cough Had CT scan of Lungs done by Pulmonary which showed peripheral interstitial opacity and septal   Continuous with Some mild Cough but does not bother him No other active issues Very active plays golf [place] Lives with his wife   Past Medical History:  Diagnosis Date   Arthritis    BPH (benign prostatic hyperplasia)    Elevated PSA    Erectile dysfunction    Gilberts syndrome    elevated bile on liver tests in past   Hyperlipidemia    Inguinal hernia recurrent unilateral    Nocturia    Osteoarthrosis and allied disorders    Other and unspecified hyperlipidemia    Rotator cuff tear, left    partial    Past Surgical History:  Procedure Laterality Date   CARPAL TUNNEL RELEASE Left 06/27/2013   Procedure: LEFT CARPAL TUNNEL RELEASE;  Surgeon: Cammie Sickle., MD;  Location: [place];  Service: Orthopedics;  Laterality: Left;   CATARACT EXTRACTION,  BILATERAL  06/03/2015   EYE SURGERY     JOINT REPLACEMENT Left    knee   KNEE ARTHROSCOPY     bilateral   POSTERIOR CERVICAL FUSION/FORAMINOTOMY N/A 11/30/2016   Procedure: Cervical one-Cervical two Posterior Instrumentation and Fusion;  Surgeon: Newman Pies, MD;  Location: [place];  Service: Neurosurgery;  Laterality: N/A;   right hand  1987   tendon repair    TONSILLECTOMY     as child   TOTAL KNEE ARTHROPLASTY  05/16/2012   Procedure: TOTAL KNEE ARTHROPLASTY;  Surgeon: Tobi Bastos, MD;  Location: [facility];  Service: Orthopedics;  Laterality: Left;   TRANSURETHRAL RESECTION OF PROSTATE N/A 05/23/2013   Procedure: TRANSURETHRAL RESECTION OF THE PROSTATE WITH GYRUS INSTRUMENTS;  Surgeon: Ailene Rud, MD;  Location: [facility];  Service: Urology;  Laterality: N/A;    Allergies  Allergen Reactions   Bee Venom Swelling    SWELLING REACTION UNSPECIFIED     Outpatient Encounter Medications as of 06/09/2021  Medication Sig   aspirin EC 81 MG tablet Take 81 mg by mouth every Monday, Wednesday, and Friday.    Calcium Carbonate (CALCIUM 500 PO) Take 1 capsule by mouth daily. 3 times a week   cholecalciferol (VITAMIN D) 1000 UNITS tablet Take 2,000 Units by mouth daily.    Coenzyme Q10 200 MG capsule Take 300 mg by mouth 3 (three) times a week.  Flaxseed, Linseed, (FLAX SEED OIL) 1000 MG CAPS Take 1,000 mg by mouth every evening.    Glucosamine-Chondroitin-Vit D3 1500-1200-800 MG-MG-UNIT PACK Take 1 tablet by mouth daily.   rosuvastatin (CRESTOR) 5 MG tablet TAKE ONE TABLET BY MOUTH EVERY OTHER DAY   No facility-administered encounter medications on file as of 06/09/2021.    Review of Systems:  Review of Systems  Constitutional:  Negative for activity change, appetite change and unexpected weight change.  HENT: Negative.    Respiratory:  Positive for cough. Negative for shortness of breath.   Cardiovascular:  Negative for leg swelling.  Gastrointestinal:  Negative for  constipation.  Genitourinary:  Negative for frequency.  Musculoskeletal:  Negative for arthralgias, gait problem and myalgias.  Skin: Negative.  Negative for rash.  Neurological:  Negative for dizziness and weakness.  Psychiatric/Behavioral:  Negative for confusion and sleep disturbance.   All other systems reviewed and are negative.  Health Maintenance  Topic Date Due   COVID-19 Vaccine (5 - Booster for Moderna series) 09/15/2021 (Originally 03/10/2021)   TETANUS/TDAP  06/17/2030   Pneumonia Vaccine 79+ Years old  Completed   INFLUENZA VACCINE  Completed   Zoster Vaccines- Shingrix  Completed   HPV VACCINES  Aged Out    Physical Exam: Vitals:   06/09/21 1452  BP: 118/67  Pulse: 65  Temp: (!) 97.4 F (36.3 C)  SpO2: 95%  Weight: 172 lb (78 kg)  Height: 5' 10.5" (1.791 m)   Body mass index is 24.33 kg/m. Physical Exam Vitals reviewed.  Constitutional:      Appearance: Normal appearance.  HENT:     Head: Normocephalic.     Mouth/Throat:     Mouth: Mucous membranes are moist.     Pharynx: Oropharynx is clear.  Eyes:     Pupils: Pupils are equal, round, and reactive to light.  Cardiovascular:     Rate and Rhythm: Normal rate and regular rhythm.     Pulses: Normal pulses.     Heart sounds: No murmur heard. Pulmonary:     Effort: Pulmonary effort is normal. No respiratory distress.     Breath sounds: Normal breath sounds. No rales.  Abdominal:     General: Abdomen is flat. Bowel sounds are normal.     Palpations: Abdomen is soft.  Musculoskeletal:        General: No swelling.     Cervical back: Neck supple.  Skin:    General: Skin is warm.  Neurological:     General: No focal deficit present.     Mental Status: He is alert and oriented to person, place, and time.  Psychiatric:        Mood and Affect: Mood normal.        Thought Content: Thought content normal.    Labs reviewed: Basic Metabolic Panel: Recent Labs    06/11/20 0810 12/09/20 0850  NA 140  142  K 4.4 4.3  CL 104 103  CO2 30 28  GLUCOSE 103* 91  BUN 21 21  CREATININE 0.88 0.79  CALCIUM 9.5 9.7  TSH  --  1.87   Liver Function Tests: Recent Labs    06/11/20 0810 12/09/20 0850  AST 18 16  ALT 20 17  BILITOT 1.4* 1.6*  PROT 6.5 6.7   No results for input(s): LIPASE, AMYLASE in the last 8760 hours. No results for input(s): AMMONIA in the last 8760 hours. CBC: Recent Labs    06/11/20 0810 12/09/20 0850  WBC 5.9 6.9  NEUTROABS  3,965 4,430  HGB 16.7 16.5  HCT 50.0 49.6  MCV 92.9 93.2  PLT 201 210   Lipid Panel: Recent Labs    06/11/20 0810 12/09/20 0850  CHOL 134 142  HDL 50 53  LDLCALC 70 75  TRIG 48 55  CHOLHDL 2.7 2.7   No results found for: HGBA1C  Procedures since last visit: No results found.  Assessment/Plan 1. Hyperlipidemia, unspecified hyperlipidemia type LDL Good on statin  2. Gilberts syndrome Bilirubin stable  3. Simple chronic bronchitis (HCC) CT scan showed Mild scarring No Meds per Pulmonary   4. Elevated PSA S/P TURP    Labs/tests ordered:  * No order type specified * Next appt:  Visit date not found

## 2021-07-01 DIAGNOSIS — D1801 Hemangioma of skin and subcutaneous tissue: Secondary | ICD-10-CM | POA: Diagnosis not present

## 2021-07-01 DIAGNOSIS — L853 Xerosis cutis: Secondary | ICD-10-CM | POA: Diagnosis not present

## 2021-07-01 DIAGNOSIS — C44519 Basal cell carcinoma of skin of other part of trunk: Secondary | ICD-10-CM | POA: Diagnosis not present

## 2021-07-01 DIAGNOSIS — Z85828 Personal history of other malignant neoplasm of skin: Secondary | ICD-10-CM | POA: Diagnosis not present

## 2021-07-01 DIAGNOSIS — L820 Inflamed seborrheic keratosis: Secondary | ICD-10-CM | POA: Diagnosis not present

## 2021-07-01 DIAGNOSIS — L821 Other seborrheic keratosis: Secondary | ICD-10-CM | POA: Diagnosis not present

## 2021-07-01 DIAGNOSIS — L570 Actinic keratosis: Secondary | ICD-10-CM | POA: Diagnosis not present

## 2021-07-01 DIAGNOSIS — L720 Epidermal cyst: Secondary | ICD-10-CM | POA: Diagnosis not present

## 2021-07-20 DIAGNOSIS — M532X1 Spinal instabilities, occipito-atlanto-axial region: Secondary | ICD-10-CM | POA: Diagnosis not present

## 2021-07-20 DIAGNOSIS — R030 Elevated blood-pressure reading, without diagnosis of hypertension: Secondary | ICD-10-CM | POA: Diagnosis not present

## 2021-07-22 ENCOUNTER — Telehealth: Payer: Self-pay

## 2021-07-22 NOTE — Telephone Encounter (Signed)
Left message for patient to call back to schedule a Medicare AWV with Windell Moulding, NP.  ?

## 2021-07-23 NOTE — Telephone Encounter (Signed)
Schedule Medicare AWV with Windell Moulding, NP at [facility] clinic ?

## 2021-09-23 DIAGNOSIS — H04123 Dry eye syndrome of bilateral lacrimal glands: Secondary | ICD-10-CM | POA: Diagnosis not present

## 2021-09-23 DIAGNOSIS — D3131 Benign neoplasm of right choroid: Secondary | ICD-10-CM | POA: Diagnosis not present

## 2021-09-23 DIAGNOSIS — Z961 Presence of intraocular lens: Secondary | ICD-10-CM | POA: Diagnosis not present

## 2021-10-04 ENCOUNTER — Encounter: Payer: Medicare Other | Admitting: Orthopedic Surgery

## 2021-10-04 ENCOUNTER — Encounter: Payer: Self-pay | Admitting: Orthopedic Surgery

## 2021-10-05 NOTE — Progress Notes (Signed)
This encounter was created in error - please disregard.

## 2021-10-06 DIAGNOSIS — Z23 Encounter for immunization: Secondary | ICD-10-CM | POA: Diagnosis not present

## 2021-12-02 ENCOUNTER — Other Ambulatory Visit: Payer: Self-pay | Admitting: Orthopedic Surgery

## 2021-12-02 DIAGNOSIS — D62 Acute posthemorrhagic anemia: Secondary | ICD-10-CM | POA: Diagnosis not present

## 2021-12-02 DIAGNOSIS — E785 Hyperlipidemia, unspecified: Secondary | ICD-10-CM | POA: Diagnosis not present

## 2021-12-02 DIAGNOSIS — R059 Cough, unspecified: Secondary | ICD-10-CM | POA: Diagnosis not present

## 2021-12-03 LAB — CBC WITH DIFFERENTIAL/PLATELET
Absolute Monocytes: 559 cells/uL (ref 200–950)
Basophils Absolute: 29 cells/uL (ref 0–200)
Basophils Relative: 0.5 %
Eosinophils Absolute: 97 cells/uL (ref 15–500)
Eosinophils Relative: 1.7 %
HCT: 48.4 % (ref 38.5–50.0)
Hemoglobin: 16.2 g/dL (ref 13.2–17.1)
Lymphs Abs: 1317 cells/uL (ref 850–3900)
MCH: 30.9 pg (ref 27.0–33.0)
MCHC: 33.5 g/dL (ref 32.0–36.0)
MCV: 92.4 fL (ref 80.0–100.0)
MPV: 10.8 fL (ref 7.5–12.5)
Monocytes Relative: 9.8 %
Neutro Abs: 3699 cells/uL (ref 1500–7800)
Neutrophils Relative %: 64.9 %
Platelets: 172 10*3/uL (ref 140–400)
RBC: 5.24 10*6/uL (ref 4.20–5.80)
RDW: 12.2 % (ref 11.0–15.0)
Total Lymphocyte: 23.1 %
WBC: 5.7 10*3/uL (ref 3.8–10.8)

## 2021-12-03 LAB — BASIC METABOLIC PANEL WITH GFR
BUN/Creatinine Ratio: 31 (calc) — ABNORMAL HIGH (ref 6–22)
BUN: 26 mg/dL — ABNORMAL HIGH (ref 7–25)
CO2: 31 mmol/L (ref 20–32)
Calcium: 9.7 mg/dL (ref 8.6–10.3)
Chloride: 102 mmol/L (ref 98–110)
Creat: 0.83 mg/dL (ref 0.70–1.22)
Glucose, Bld: 98 mg/dL (ref 65–99)
Potassium: 4.3 mmol/L (ref 3.5–5.3)
Sodium: 139 mmol/L (ref 135–146)
eGFR: 85 mL/min/{1.73_m2} (ref >=60)

## 2021-12-03 LAB — HEPATIC FUNCTION PANEL
AG Ratio: 2 (calc) (ref 1.0–2.5)
ALT: 18 U/L (ref 9–46)
AST: 19 U/L (ref 10–35)
Albumin: 4.5 g/dL (ref 3.6–5.1)
Alkaline phosphatase (APISO): 65 U/L (ref 35–144)
Bilirubin, Direct: 0.3 mg/dL — ABNORMAL HIGH (ref 0.0–0.2)
Globulin: 2.2 g/dL (calc) (ref 1.9–3.7)
Indirect Bilirubin: 1.6 mg/dL (calc) — ABNORMAL HIGH (ref 0.2–1.2)
Total Bilirubin: 1.9 mg/dL — ABNORMAL HIGH (ref 0.2–1.2)
Total Protein: 6.7 g/dL (ref 6.1–8.1)

## 2021-12-03 LAB — LIPID PANEL
Cholesterol: 139 mg/dL (ref <200)
HDL: 55 mg/dL (ref >=40)
LDL Cholesterol (Calc): 71 mg/dL (calc)
Non-HDL Cholesterol (Calc): 84 mg/dL (calc) (ref <130)
Total CHOL/HDL Ratio: 2.5 (calc) (ref <5.0)
Triglycerides: 57 mg/dL (ref <150)

## 2021-12-08 ENCOUNTER — Encounter: Payer: Medicare Other | Admitting: Internal Medicine

## 2021-12-15 ENCOUNTER — Encounter: Payer: Self-pay | Admitting: Internal Medicine

## 2021-12-15 ENCOUNTER — Non-Acute Institutional Stay: Payer: Medicare Other | Admitting: Internal Medicine

## 2021-12-15 VITALS — BP 132/77 | HR 65 | Temp 96.8°F | Resp 16 | Ht 70.5 in | Wt 168.0 lb

## 2021-12-15 DIAGNOSIS — E785 Hyperlipidemia, unspecified: Secondary | ICD-10-CM | POA: Diagnosis not present

## 2021-12-15 DIAGNOSIS — J410 Simple chronic bronchitis: Secondary | ICD-10-CM

## 2021-12-15 DIAGNOSIS — R197 Diarrhea, unspecified: Secondary | ICD-10-CM | POA: Diagnosis not present

## 2021-12-16 NOTE — Progress Notes (Signed)
Location:      Place of Service:  Clinic (12)  Provider:   Code Status:  Goals of Care:     12/15/2021    4:07 PM  Advanced Directives  Does Patient Have a Medical Advance Directive? Yes  Type of Paramedic of [place];Living will;Out of facility DNR (pink MOST or yellow form)  Does patient want to make changes to medical advance directive? No - Patient declined  Copy of [place] in Chart? No - copy requested     Chief Complaint  Patient presents with   Medical Management of Chronic Issues    6 month follow up.    Immunizations    Discuss the need for Covid Booster, and Influenza vaccine.    Review Labs     Review recent labs.     HPI: Patient is a 88 y.o. male seen today for medical management of chronic diseases.    Hyperlipidemia Takes Crestor 3/week LDL less then 100 Gilbert Syndrome Mild Elevated Indirect Bilirubin H/o Elevated PSA Has had number of Biopsy and they have been Negative  Chronic Productive cough Had CT scan of Lungs done by Pulmonary which showed peripheral interstitial opacity and septal Thickening  Had some diarrhea few days ago Took Imodium and then had to take Suppository to move his bowls. No abdominal pain or Nausea or Vomiting Going for his Grandson wedding Continues to play Golf Very active Lives with his wife Has daughter in town    Past Medical History:  Diagnosis Date   Arthritis    BPH (benign prostatic hyperplasia)    Elevated PSA    Erectile dysfunction    Gilberts syndrome    elevated bile on liver tests in past   Hyperlipidemia    Inguinal hernia recurrent unilateral    Nocturia    Osteoarthrosis and allied disorders    Other and unspecified hyperlipidemia    Rotator cuff tear, left    partial    Past Surgical History:  Procedure Laterality Date   CARPAL TUNNEL RELEASE Left 06/27/2013   Procedure: LEFT CARPAL TUNNEL RELEASE;  Surgeon: Cammie Sickle., MD;  Location:  [place];  Service: Orthopedics;  Laterality: Left;   CATARACT EXTRACTION, BILATERAL  06/03/2015   EYE SURGERY     JOINT REPLACEMENT Left    knee   KNEE ARTHROSCOPY     bilateral   POSTERIOR CERVICAL FUSION/FORAMINOTOMY N/A 11/30/2016   Procedure: Cervical one-Cervical two Posterior Instrumentation and Fusion;  Surgeon: Newman Pies, MD;  Location: [place];  Service: Neurosurgery;  Laterality: N/A;   right hand  1987   tendon repair    TONSILLECTOMY     as child   TOTAL KNEE ARTHROPLASTY  05/16/2012   Procedure: TOTAL KNEE ARTHROPLASTY;  Surgeon: Tobi Bastos, MD;  Location: [facility];  Service: Orthopedics;  Laterality: Left;   TRANSURETHRAL RESECTION OF PROSTATE N/A 05/23/2013   Procedure: TRANSURETHRAL RESECTION OF THE PROSTATE WITH GYRUS INSTRUMENTS;  Surgeon: Ailene Rud, MD;  Location: [facility];  Service: Urology;  Laterality: N/A;    Allergies  Allergen Reactions   Bee Venom Swelling    SWELLING REACTION UNSPECIFIED     Outpatient Encounter Medications as of 12/15/2021  Medication Sig   aspirin EC 81 MG tablet Take 81 mg by mouth every Monday, Wednesday, and Friday.    Calcium Carbonate (CALCIUM 500 PO) Take 1 capsule by mouth daily. 3 times a week   cholecalciferol (  VITAMIN D) 1000 UNITS tablet Take 2,000 Units by mouth daily.    Coenzyme Q10 200 MG capsule Take 300 mg by mouth 3 (three) times a week.    Flaxseed, Linseed, (FLAX SEED OIL) 1000 MG CAPS Take 1,000 mg by mouth every evening.    Glucosamine-Chondroitin-Vit D3 1500-1200-800 MG-MG-UNIT PACK Take 1 tablet by mouth daily.   rosuvastatin (CRESTOR) 5 MG tablet TAKE ONE TABLET BY MOUTH EVERY OTHER DAY   No facility-administered encounter medications on file as of 12/15/2021.    Review of Systems:  Review of Systems  Constitutional:  Negative for activity change, appetite change and unexpected weight change.  HENT: Negative.    Respiratory:  Negative for cough and shortness of breath.    Cardiovascular:  Negative for leg swelling.  Gastrointestinal:  Negative for constipation.  Genitourinary:  Negative for frequency.  Musculoskeletal:  Negative for arthralgias, gait problem and myalgias.  Skin: Negative.  Negative for rash.  Neurological:  Negative for dizziness and weakness.  Psychiatric/Behavioral:  Negative for confusion and sleep disturbance.   All other systems reviewed and are negative.   Health Maintenance  Topic Date Due   COVID-19 Vaccine (6 - Moderna series) 12/01/2021   INFLUENZA VACCINE  11/23/2021   TETANUS/TDAP  06/17/2030   Pneumonia Vaccine 39+ Years old  Completed   Zoster Vaccines- Shingrix  Completed   HPV VACCINES  Aged Out    Physical Exam: Vitals:   12/15/21 1600  BP: 132/77  Pulse: 65  Resp: 16  Temp: (!) 96.8 F (36 C)  SpO2: 96%  Weight: 168 lb (76.2 kg)  Height: 5' 10.5" (1.791 m)   Body mass index is 23.76 kg/m. Physical Exam Vitals reviewed.  Constitutional:      Appearance: Normal appearance.  HENT:     Head: Normocephalic.     Nose: Nose normal.     Mouth/Throat:     Mouth: Mucous membranes are moist.     Pharynx: Oropharynx is clear.  Eyes:     Pupils: Pupils are equal, round, and reactive to light.  Cardiovascular:     Rate and Rhythm: Normal rate and regular rhythm.     Pulses: Normal pulses.     Heart sounds: No murmur heard. Pulmonary:     Effort: Pulmonary effort is normal. No respiratory distress.     Breath sounds: Normal breath sounds. No rales.     Comments: Few crackles in the base Abdominal:     General: Abdomen is flat. Bowel sounds are normal.     Palpations: Abdomen is soft.  Musculoskeletal:        General: No swelling.     Cervical back: Neck supple.  Skin:    General: Skin is warm.  Neurological:     General: No focal deficit present.     Mental Status: He is alert and oriented to person, place, and time.  Psychiatric:        Mood and Affect: Mood normal.        Thought Content:  Thought content normal.     Labs reviewed: Basic Metabolic Panel: Recent Labs    06/03/21 0000 12/02/21 0840  NA 141 139  K 4.4 4.3  CL 103 102  CO2 28* 31  GLUCOSE  --  98  BUN 22* 26*  CREATININE 0.9 0.83  CALCIUM 9.5 9.7   Liver Function Tests: Recent Labs    06/03/21 0000 12/02/21 0840  AST 17 19  ALT 15 18  ALKPHOS 64  --  BILITOT  --  1.9*  PROT  --  6.7  ALBUMIN 4.1  --    No results for input(s): "LIPASE", "AMYLASE" in the last 8760 hours. No results for input(s): "AMMONIA" in the last 8760 hours. CBC: Recent Labs    06/03/21 0000 12/02/21 0840  WBC 5.8 5.7  NEUTROABS 3,631.00 3,699  HGB 15.7 16.2  HCT 47 48.4  MCV  --  92.4  PLT 189 172   Lipid Panel: Recent Labs    06/03/21 0000 12/02/21 0840  CHOL 142 139  HDL 49 55  LDLCALC 81 71  TRIG 50 57  CHOLHDL  --  2.5   No results found for: "HGBA1C"  Procedures since last visit: No results found.  Assessment/Plan 1. Hyperlipidemia, unspecified hyperlipidemia type On Crestor LDL good  2. Gilberts syndrome   3. Simple chronic bronchitis (HCC) Doing well no Active symptoms  4. Diarrhea, unspecified type Resolved for now  Labs Discussed   Labs/tests ordered:  * No order type specified * Next appt:  Visit date not found

## 2022-03-01 DIAGNOSIS — Z23 Encounter for immunization: Secondary | ICD-10-CM | POA: Diagnosis not present

## 2022-04-22 ENCOUNTER — Other Ambulatory Visit: Payer: Self-pay | Admitting: Internal Medicine

## 2022-06-23 ENCOUNTER — Other Ambulatory Visit: Payer: Medicare Other

## 2022-06-23 ENCOUNTER — Other Ambulatory Visit: Payer: Self-pay | Admitting: Internal Medicine

## 2022-06-24 ENCOUNTER — Other Ambulatory Visit: Payer: Self-pay | Admitting: Internal Medicine

## 2022-06-24 DIAGNOSIS — E785 Hyperlipidemia, unspecified: Secondary | ICD-10-CM

## 2022-06-24 DIAGNOSIS — J410 Simple chronic bronchitis: Secondary | ICD-10-CM

## 2022-06-27 ENCOUNTER — Other Ambulatory Visit: Payer: Medicare Other

## 2022-06-27 DIAGNOSIS — E785 Hyperlipidemia, unspecified: Secondary | ICD-10-CM | POA: Diagnosis not present

## 2022-06-27 DIAGNOSIS — J410 Simple chronic bronchitis: Secondary | ICD-10-CM | POA: Diagnosis not present

## 2022-06-28 LAB — CBC WITH DIFFERENTIAL/PLATELET
Absolute Monocytes: 485 cells/uL (ref 200–950)
Basophils Absolute: 38 cells/uL (ref 0–200)
Basophils Relative: 0.6 %
Eosinophils Absolute: 113 cells/uL (ref 15–500)
Eosinophils Relative: 1.8 %
HCT: 46.4 % (ref 38.5–50.0)
Hemoglobin: 15.9 g/dL (ref 13.2–17.1)
Lymphs Abs: 1386 cells/uL (ref 850–3900)
MCH: 31.3 pg (ref 27.0–33.0)
MCHC: 34.3 g/dL (ref 32.0–36.0)
MCV: 91.3 fL (ref 80.0–100.0)
MPV: 10.6 fL (ref 7.5–12.5)
Monocytes Relative: 7.7 %
Neutro Abs: 4278 cells/uL (ref 1500–7800)
Neutrophils Relative %: 67.9 %
Platelets: 191 10*3/uL (ref 140–400)
RBC: 5.08 10*6/uL (ref 4.20–5.80)
RDW: 12 % (ref 11.0–15.0)
Total Lymphocyte: 22 %
WBC: 6.3 10*3/uL (ref 3.8–10.8)

## 2022-06-28 LAB — COMPLETE METABOLIC PANEL WITH GFR
AG Ratio: 2.1 (calc) (ref 1.0–2.5)
ALT: 13 U/L (ref 9–46)
AST: 14 U/L (ref 10–35)
Albumin: 4.4 g/dL (ref 3.6–5.1)
Alkaline phosphatase (APISO): 69 U/L (ref 35–144)
BUN: 21 mg/dL (ref 7–25)
CO2: 27 mmol/L (ref 20–32)
Calcium: 9.3 mg/dL (ref 8.6–10.3)
Chloride: 103 mmol/L (ref 98–110)
Creat: 0.86 mg/dL (ref 0.70–1.22)
Globulin: 2.1 g/dL (calc) (ref 1.9–3.7)
Glucose, Bld: 104 mg/dL — ABNORMAL HIGH (ref 65–99)
Potassium: 4 mmol/L (ref 3.5–5.3)
Sodium: 141 mmol/L (ref 135–146)
Total Bilirubin: 1.4 mg/dL — ABNORMAL HIGH (ref 0.2–1.2)
Total Protein: 6.5 g/dL (ref 6.1–8.1)
eGFR: 83 mL/min/{1.73_m2} (ref >=60)

## 2022-06-28 LAB — TSH: TSH: 2.08 mIU/L (ref 0.40–4.50)

## 2022-06-28 LAB — LIPID PANEL
Cholesterol: 147 mg/dL (ref <200)
HDL: 56 mg/dL (ref >=40)
LDL Cholesterol (Calc): 77 mg/dL (calc)
Non-HDL Cholesterol (Calc): 91 mg/dL (calc) (ref <130)
Total CHOL/HDL Ratio: 2.6 (calc) (ref <5.0)
Triglycerides: 68 mg/dL (ref <150)

## 2022-06-29 ENCOUNTER — Encounter: Payer: Self-pay | Admitting: Internal Medicine

## 2022-06-29 ENCOUNTER — Non-Acute Institutional Stay: Payer: Medicare Other | Admitting: Internal Medicine

## 2022-06-29 VITALS — BP 132/76 | HR 77 | Temp 97.6°F | Resp 17 | Ht 70.5 in | Wt 175.0 lb

## 2022-06-29 DIAGNOSIS — R079 Chest pain, unspecified: Secondary | ICD-10-CM

## 2022-06-29 DIAGNOSIS — J410 Simple chronic bronchitis: Secondary | ICD-10-CM | POA: Diagnosis not present

## 2022-06-29 DIAGNOSIS — E785 Hyperlipidemia, unspecified: Secondary | ICD-10-CM

## 2022-06-29 DIAGNOSIS — Z981 Arthrodesis status: Secondary | ICD-10-CM | POA: Insufficient documentation

## 2022-06-29 NOTE — Progress Notes (Signed)
Location:  [facility] (12)  Provider:   Code Status:  Goals of Care:     06/29/2022    1:56 PM  Advanced Directives  Does Patient Have a Medical Advance Directive? Yes  Type of Paramedic of [place];Living will;Out of facility DNR (pink MOST or yellow form)     Chief Complaint  Patient presents with   Medical Management of Chronic Issues    6 month follow up with labs    Quality Metric Gaps    Discussed the need for AWV     HPI: Patient is a 89 y.o. male seen today for medical management of chronic diseases.    Lives in [place] with his wife  Hyperlipidemia Takes Crestor 3/week LDL less then 100 Gilbert Syndrome Mild Elevated Indirect Bilirubin H/o Elevated PSA Has had number of Biopsy and they have been Negative  Chronic Productive cough Had CT scan of Lungs done by Pulmonary which showed peripheral interstitial opacity and septal Thickening. Possible early Fibrosis  Patient says this morning he had an episode of Right sided chest pain when he bend down to pick up his Papers He felt pain when he took deep breadth No SOB or Diaphoresis No Fever Nurse came to see him and all his vitals were within normal limits His pain is now gone  He denies any worsening of his cough OR SOB on exertion recently Has gained some weight  Does have Chronic Left shoulder pain    Past Medical History:  Diagnosis Date   Arthritis    BPH (benign prostatic hyperplasia)    Elevated PSA    Erectile dysfunction    Gilberts syndrome    elevated bile on liver tests in past   Hyperlipidemia    Inguinal hernia recurrent unilateral    Nocturia    Osteoarthrosis and allied disorders    Other and unspecified hyperlipidemia    Rotator cuff tear, left    partial    Past Surgical History:  Procedure Laterality Date   CARPAL TUNNEL RELEASE Left 06/27/2013   Procedure: LEFT CARPAL TUNNEL RELEASE;  Surgeon: Cammie Sickle.,  MD;  Location: [place];  Service: Orthopedics;  Laterality: Left;   CATARACT EXTRACTION, BILATERAL  06/03/2015   EYE SURGERY     JOINT REPLACEMENT Left    knee   KNEE ARTHROSCOPY     bilateral   POSTERIOR CERVICAL FUSION/FORAMINOTOMY N/A 11/30/2016   Procedure: Cervical one-Cervical two Posterior Instrumentation and Fusion;  Surgeon: Newman Pies, MD;  Location: [place];  Service: Neurosurgery;  Laterality: N/A;   right hand  1987   tendon repair    TONSILLECTOMY     as child   TOTAL KNEE ARTHROPLASTY  05/16/2012   Procedure: TOTAL KNEE ARTHROPLASTY;  Surgeon: Tobi Bastos, MD;  Location: [facility];  Service: Orthopedics;  Laterality: Left;   TRANSURETHRAL RESECTION OF PROSTATE N/A 05/23/2013   Procedure: TRANSURETHRAL RESECTION OF THE PROSTATE WITH GYRUS INSTRUMENTS;  Surgeon: Ailene Rud, MD;  [facility]: [facility];  Service: Urology;  Laterality: N/A;    Allergies  Allergen Reactions   Bee Venom Swelling    SWELLING REACTION UNSPECIFIED     Outpatient Encounter Medications as of 06/29/2022  Medication Sig   aspirin EC 81 MG tablet Take 81 mg by mouth every Monday, Wednesday, and Friday.    Calcium Carbonate (CALCIUM 500 PO) Take 1 capsule by mouth daily. 3  times a week   cholecalciferol (VITAMIN D) 1000 UNITS tablet Take 2,000 Units by mouth daily.    Coenzyme Q10 200 MG capsule Take 300 mg by mouth 3 (three) times a week.    Flaxseed, Linseed, (FLAX SEED OIL) 1000 MG CAPS Take 1,000 mg by mouth every evening.    Glucosamine-Chondroitin-Vit D3 1500-1200-800 MG-MG-UNIT PACK Take 1 tablet by mouth daily.   rosuvastatin (CRESTOR) 5 MG tablet TAKE ONE TABLET BY MOUTH EVERY OTHER DAY   No facility-administered encounter medications on file as of 06/29/2022.    Review of Systems:  Review of Systems  Constitutional:  Negative for activity change, appetite change and unexpected weight change.  HENT: Negative.    Respiratory:  Negative for cough and shortness of  breath.   Cardiovascular:  Negative for leg swelling.  Gastrointestinal:  Negative for constipation.  Genitourinary:  Negative for frequency.  Musculoskeletal:  Negative for arthralgias, gait problem and myalgias.  Skin: Negative.  Negative for rash.  Neurological:  Negative for dizziness and weakness.  Psychiatric/Behavioral:  Negative for confusion and sleep disturbance.   All other systems reviewed and are negative.   Health Maintenance  Topic Date Due   COVID-19 Vaccine (7 - 2023-24 season) 12/24/2021   Medicare Annual Wellness (AWV)  07/30/2022 (Originally 10/03/2019)   DTaP/Tdap/Td (3 - Td or Tdap) 06/17/2030   Pneumonia Vaccine 74+ Years old  Completed   INFLUENZA VACCINE  Completed   Zoster Vaccines- Shingrix  Completed   HPV VACCINES  Aged Out    Physical Exam: Vitals:   06/29/22 1353  BP: 132/76  Pulse: 77  Resp: 17  Temp: 97.6 F (36.4 C)  TempSrc: Temporal  SpO2: 95%  Weight: 175 lb (79.4 kg)  Height: 5' 10.5" (1.791 m)   Body mass index is 24.75 kg/m. Physical Exam Vitals reviewed.  Constitutional:      Appearance: Normal appearance.  HENT:     Head: Normocephalic.     Nose: Nose normal.     Mouth/Throat:     Mouth: Mucous membranes are moist.     Pharynx: Oropharynx is clear.  Eyes:     Pupils: Pupils are equal, round, and reactive to light.  Cardiovascular:     Rate and Rhythm: Normal rate and regular rhythm.     Pulses: Normal pulses.     Heart sounds: No murmur heard. Pulmonary:     Effort: Pulmonary effort is normal. No respiratory distress.     Breath sounds: Normal breath sounds.     Comments: Fine crackles in the bases Abdominal:     General: Abdomen is flat. Bowel sounds are normal.     Palpations: Abdomen is soft.  Musculoskeletal:        General: No swelling.     Cervical back: Neck supple.  Skin:    General: Skin is warm.  Neurological:     General: No focal deficit present.     Mental Status: He is alert and oriented to  person, place, and time.  Psychiatric:        Mood and Affect: Mood normal.        Thought Content: Thought content normal.     Labs reviewed: Basic Metabolic Panel: Recent Labs    12/02/21 0840 06/27/22 0806  NA 139 141  K 4.3 4.0  CL 102 103  CO2 31 27  GLUCOSE 98 104*  BUN 26* 21  CREATININE 0.83 0.86  CALCIUM 9.7 9.3  TSH  --  2.08  Liver Function Tests: Recent Labs    12/02/21 0840 06/27/22 0806  AST 19 14  ALT 18 13  BILITOT 1.9* 1.4*  PROT 6.7 6.5   No results for input(s): "LIPASE", "AMYLASE" in the last 8760 hours. No results for input(s): "AMMONIA" in the last 8760 hours. CBC: Recent Labs    12/02/21 0840 06/27/22 0806  WBC 5.7 6.3  NEUTROABS 3,699 4,278  HGB 16.2 15.9  HCT 48.4 46.4  MCV 92.4 91.3  PLT 172 191   Lipid Panel: Recent Labs    12/02/21 0840 06/27/22 0806  CHOL 139 147  HDL 55 56  LDLCALC 71 77  TRIG 57 68  CHOLHDL 2.5 2.6   No results found for: "HGBA1C"  Procedures since last visit: No results found.  Assessment/Plan 1. Simple chronic bronchitis ([place]) He had CT in 2022 We talked about repeating it to rule out worsening Fibrosis He does not have any symptoms Does not want to follow with Pulmonary   2. Right-sided chest pain Seems muscular Resolved now Continue to monitor  3. Hyperlipidemia, unspecified hyperlipidemia type On statin  4. Gilberts syndrome     Labs/tests ordered:  * No order type specified * Next appt:  Visit date not found

## 2022-08-17 DIAGNOSIS — L820 Inflamed seborrheic keratosis: Secondary | ICD-10-CM | POA: Diagnosis not present

## 2022-08-17 DIAGNOSIS — L821 Other seborrheic keratosis: Secondary | ICD-10-CM | POA: Diagnosis not present

## 2022-08-17 DIAGNOSIS — D485 Neoplasm of uncertain behavior of skin: Secondary | ICD-10-CM | POA: Diagnosis not present

## 2022-08-17 DIAGNOSIS — D1801 Hemangioma of skin and subcutaneous tissue: Secondary | ICD-10-CM | POA: Diagnosis not present

## 2022-08-17 DIAGNOSIS — L570 Actinic keratosis: Secondary | ICD-10-CM | POA: Diagnosis not present

## 2022-08-17 DIAGNOSIS — Z85828 Personal history of other malignant neoplasm of skin: Secondary | ICD-10-CM | POA: Diagnosis not present

## 2022-10-12 ENCOUNTER — Other Ambulatory Visit: Payer: Self-pay | Admitting: Internal Medicine

## 2022-11-21 ENCOUNTER — Non-Acute Institutional Stay (INDEPENDENT_AMBULATORY_CARE_PROVIDER_SITE_OTHER): Payer: Medicare Other | Admitting: Orthopedic Surgery

## 2022-11-21 ENCOUNTER — Encounter: Payer: Self-pay | Admitting: Orthopedic Surgery

## 2022-11-21 VITALS — BP 116/68 | HR 71 | Temp 96.8°F | Resp 17 | Ht 70.5 in | Wt 173.8 lb

## 2022-11-21 DIAGNOSIS — M7989 Other specified soft tissue disorders: Secondary | ICD-10-CM

## 2022-11-21 DIAGNOSIS — M79662 Pain in left lower leg: Secondary | ICD-10-CM

## 2022-11-21 NOTE — Patient Instructions (Signed)
Orders to have left lower leg ultrasound made> if they do not contact you within 1-2 days> please notify our office  IF AT ANY TIME YOU FEEL CHEST PAIN OR SHORTNESS OF BREATH, PLEASE REPORT TO EMERGENCY DEPARTMENT.

## 2022-11-21 NOTE — Progress Notes (Signed)
Careteam: Patient Care Team: Mahlon Gammon, MD as PCP - General (Internal Medicine) Jethro Bolus, MD (Inactive) as Consulting Physician (Urology) Ovidio Kin, MD as Consulting Physician (General Surgery) Aris Lot, MD as Consulting Physician (Dermatology) Adam Phenix, DPM as Consulting Physician (Podiatry)  Seen by: Hazle Nordmann, AGNP-C  PLACE OF SERVICE:  [facility]  Advanced Directive information Does Patient Have a Medical Advance Directive?: Yes, Type of Advance Directive: Healthcare Power of [place];Living will;Out of facility DNR (pink MOST or yellow form), Does patient want to make changes to medical advance directive?: No - Patient declined  Allergies  Allergen Reactions   Bee Venom Swelling    SWELLING REACTION UNSPECIFIED     Chief Complaint  Patient presents with   Acute Visit    Patient complains of left leg pain.      HPI: Patient is a 89 y.o. male seen today for acute visit due to left leg pain.   He reports increased left lower leg pain and swelling x 4 weeks. No recent injury or fall. H/o left knee replacement 2014. Knee pain occurs at rest. Pain has worsened with every week that has passed. He has tried taking tylenol and extra aspirin without success. Afebrile. Vitals stable. He denies chest pain or shortness of breath.      Review of Systems:  Review of Systems  Constitutional:  Negative for fever and malaise/fatigue.  Respiratory:  Negative for cough, shortness of breath and wheezing.   Cardiovascular:  Positive for leg swelling. Negative for chest pain.  Musculoskeletal:  Positive for joint pain. Negative for falls.  Neurological:  Negative for weakness.  Psychiatric/Behavioral:  Negative for depression. The patient is not nervous/anxious.     Past Medical History:  Diagnosis Date   Arthritis    BPH (benign prostatic hyperplasia)    Elevated PSA    Erectile dysfunction    Gilberts syndrome    elevated bile on liver tests  in past   Hyperlipidemia    Inguinal hernia recurrent unilateral    Nocturia    Osteoarthrosis and allied disorders    Other and unspecified hyperlipidemia    Rotator cuff tear, left    partial   Past Surgical History:  Procedure Laterality Date   CARPAL TUNNEL RELEASE Left 06/27/2013   Procedure: LEFT CARPAL TUNNEL RELEASE;  Surgeon: Wyn Forster., MD;  Location: [facility];  Service: Orthopedics;  Laterality: Left;   CATARACT EXTRACTION, BILATERAL  06/03/2015   EYE SURGERY     JOINT REPLACEMENT Left    knee   KNEE ARTHROSCOPY     bilateral   POSTERIOR CERVICAL FUSION/FORAMINOTOMY N/A 11/30/2016   Procedure: Cervical one-Cervical two Posterior Instrumentation and Fusion;  Surgeon: Tressie Stalker, MD;  Location: [facility] OR;  Service: Neurosurgery;  Laterality: N/A;   right hand  1987   tendon repair    TONSILLECTOMY     as child   TOTAL KNEE ARTHROPLASTY  05/16/2012   Procedure: TOTAL KNEE ARTHROPLASTY;  Surgeon: Jacki Cones, MD;  Location: [facility];  Service: Orthopedics;  Laterality: Left;   TRANSURETHRAL RESECTION OF PROSTATE N/A 05/23/2013   Procedure: TRANSURETHRAL RESECTION OF THE PROSTATE WITH GYRUS INSTRUMENTS;  Surgeon: Kathi Ludwig, MD;  Location: [facility];  Service: Urology;  Laterality: N/A;   Social History:   reports that he quit smoking about 40 years ago. His smoking use included pipe, cigars, and cigarettes. He started smoking about 66 years ago. He has a 50  pack-year smoking history. He has never used smokeless tobacco. He reports current alcohol use of about 1.0 standard drink of alcohol per week. He reports that he does not use drugs.  Family History  Problem Relation Age of Onset   COPD Mother    Heart disease Father    Heart attack Father 10       Died age 58   Cancer Maternal Grandfather    Heart attack Paternal Grandfather    Hypertension Sister    Hypertension Sister     Medications: Patient's Medications  New Prescriptions    No medications on file  Previous Medications   ASPIRIN EC 81 MG TABLET    Take 81 mg by mouth every Monday, Wednesday, and Friday.    CALCIUM CARBONATE (CALCIUM 500 PO)    Take 1 capsule by mouth daily. 3 times a week   CHOLECALCIFEROL (VITAMIN D) 1000 UNITS TABLET    Take 2,000 Units by mouth daily.    COENZYME Q10 200 MG CAPSULE    Take 300 mg by mouth 3 (three) times a week.    FLAXSEED, LINSEED, (FLAX SEED OIL) 1000 MG CAPS    Take 1,000 mg by mouth every evening.    GLUCOSAMINE-CHONDROITIN-VIT D3 1500-1200-800 MG-MG-UNIT PACK    Take 1 tablet by mouth daily.   ROSUVASTATIN (CRESTOR) 5 MG TABLET    TAKE ONE TABLET BY MOUTH EVERY OTHER DAY  Modified Medications   No medications on file  Discontinued Medications   No medications on file    Physical Exam:  Vitals:   11/21/22 1354  BP: 116/68  Pulse: 71  Resp: 17  Temp: (!) 96.8 F (36 C)  SpO2: 96%  Weight: 173 lb 12.8 oz (78.8 kg)  Height: 5' 10.5" (1.791 m)   Body mass index is 24.59 kg/m. Wt Readings from Last 3 Encounters:  11/21/22 173 lb 12.8 oz (78.8 kg)  06/29/22 175 lb (79.4 kg)  12/15/21 168 lb (76.2 kg)    Physical Exam Vitals reviewed.  Constitutional:      General: He is not in acute distress. HENT:     Head: Normocephalic.  Eyes:     General:        Right eye: No discharge.        Left eye: No discharge.  Cardiovascular:     Pulses: Normal pulses.     Heart sounds: Normal heart sounds.  Pulmonary:     Effort: Pulmonary effort is normal. No respiratory distress.     Breath sounds: Normal breath sounds. No wheezing.  Abdominal:     General: Bowel sounds are normal. There is no distension.     Palpations: Abdomen is soft.     Tenderness: There is no abdominal tenderness.  Musculoskeletal:     Right lower leg: No edema.     Left lower leg: Edema present.     Comments: Non pitting edema from above left knee extending down to ankle, negative Homans, extremity slightly warm, dorsal pedis 1+.    Skin:    General: Skin is warm.     Capillary Refill: Capillary refill takes less than 2 seconds.  Neurological:     General: No focal deficit present.     Mental Status: He is alert and oriented to person, place, and time.  Psychiatric:        Mood and Affect: Mood normal.     Labs reviewed: Basic Metabolic Panel: Recent Labs    12/02/21 0840 06/27/22 2130  NA 139 141  K 4.3 4.0  CL 102 103  CO2 31 27  GLUCOSE 98 104*  BUN 26* 21  CREATININE 0.83 0.86  CALCIUM 9.7 9.3  TSH  --  2.08   Liver Function Tests: Recent Labs    12/02/21 0840 06/27/22 0806  AST 19 14  ALT 18 13  BILITOT 1.9* 1.4*  PROT 6.7 6.5   No results for input(s): "LIPASE", "AMYLASE" in the last 8760 hours. No results for input(s): "AMMONIA" in the last 8760 hours. CBC: Recent Labs    12/02/21 0840 06/27/22 0806  WBC 5.7 6.3  NEUTROABS 3,699 4,278  HGB 16.2 15.9  HCT 48.4 46.4  MCV 92.4 91.3  PLT 172 191   Lipid Panel: Recent Labs    12/02/21 0840 06/27/22 0806  CHOL 139 147  HDL 55 56  LDLCALC 71 77  TRIG 57 68  CHOLHDL 2.5 2.6   TSH: Recent Labs    06/27/22 0806  TSH 2.08   A1C: No results found for: "HGBA1C"   Assessment/Plan 1. Pain and swelling of left lower leg - onset about 4 weeks ago - non pitting edema to left above left knee extending to ankle, some warmth, negative Homans - tylenol and aspirin unsuccessful in reducing pain - venous doppler to r/o DVT - VAS [place] LOWER EXTREMITY VENOUS (DVT); Future   Next appt: Visit date not found  Tuwanna Krausz Scherry Ran  [facility] [phone]

## 2022-11-22 ENCOUNTER — Other Ambulatory Visit: Payer: Self-pay

## 2022-11-22 ENCOUNTER — Ambulatory Visit (HOSPITAL_COMMUNITY)
Admission: RE | Admit: 2022-11-22 | Discharge: 2022-11-22 | Disposition: A | Discharge status: Home / Self Care | Payer: Medicare Other | Source: Ambulatory Visit | Attending: Cardiology | Admitting: Cardiology

## 2022-11-22 DIAGNOSIS — M7989 Other specified soft tissue disorders: Secondary | ICD-10-CM | POA: Diagnosis not present

## 2022-11-22 DIAGNOSIS — M79662 Pain in left lower leg: Secondary | ICD-10-CM | POA: Diagnosis not present

## 2022-11-22 MED ORDER — ACETAMINOPHEN 500 MG PO TABS: 1000.0000 mg | ORAL_TABLET | Freq: Every morning | ORAL | Status: AC

## 2022-12-19 ENCOUNTER — Encounter: Payer: Self-pay | Admitting: Orthopedic Surgery

## 2022-12-19 ENCOUNTER — Non-Acute Institutional Stay: Payer: Medicare Other | Admitting: Orthopedic Surgery

## 2022-12-19 VITALS — BP 122/72 | HR 75 | Temp 97.3°F | Resp 18 | Ht 70.5 in | Wt 173.0 lb

## 2022-12-19 DIAGNOSIS — M7989 Other specified soft tissue disorders: Secondary | ICD-10-CM

## 2022-12-19 DIAGNOSIS — M79662 Pain in left lower leg: Secondary | ICD-10-CM

## 2022-12-19 NOTE — Progress Notes (Unsigned)
Location:  [facility] Service:  Clinic ([phone]) Provider:  Octavia Heir, NP   Mahlon Gammon, MD  Patient Care Team: Mahlon Gammon, MD as PCP - General (Internal Medicine) Jethro Bolus, MD (Inactive) as Consulting Physician (Urology) Ovidio Kin, MD as Consulting Physician (General Surgery) Aris Lot, MD as Consulting Physician (Dermatology) Adam Phenix, DPM as Consulting Physician (Podiatry)  Extended Emergency Contact Information Primary Emergency Contact: [facility] Address: [address]          [place], [place] [address] [place] Home Phone: [phone] Relation: Spouse  Code Status:  Full code Goals of care: Advanced Directive information    11/21/2022    1:58 PM  Advanced Directives  Does Patient Have a Medical Advance Directive? Yes  Type of Estate agent of [place];Living will;Out of facility DNR (pink MOST or yellow form)  Does patient want to make changes to medical advance directive? No - Patient declined  Copy of Healthcare Power of Attorney in Chart? No - copy requested     Chief Complaint  Patient presents with   Acute Visit    Left leg swelling     HPI:  Pt is a 89 y.o. male seen today for acute visit due to left leg swelling.   Left leg swelling improved. He is wearing compressing stokcing daily. Tylenol and blu emu for left knee pain.   Past Medical History:  Diagnosis Date   Arthritis    BPH (benign prostatic hyperplasia)    Elevated PSA    Erectile dysfunction    Gilberts syndrome    elevated bile on liver tests in past   Hyperlipidemia    Inguinal hernia recurrent unilateral    Nocturia    Osteoarthrosis and allied disorders    Other and unspecified hyperlipidemia    Rotator cuff tear, left    partial   Past Surgical History:  Procedure Laterality Date   CARPAL TUNNEL RELEASE Left 06/27/2013   Procedure: LEFT CARPAL TUNNEL RELEASE;  Surgeon:  Wyn Forster., MD;  Location: [facility];  Service: Orthopedics;  Laterality: Left;   CATARACT EXTRACTION, BILATERAL  06/03/2015   EYE SURGERY     JOINT REPLACEMENT Left    knee   KNEE ARTHROSCOPY     bilateral   POSTERIOR CERVICAL FUSION/FORAMINOTOMY N/A 11/30/2016   Procedure: Cervical one-Cervical two Posterior Instrumentation and Fusion;  Surgeon: Tressie Stalker, MD;  Location: [facility] OR;  Service: Neurosurgery;  Laterality: N/A;   right hand  1987   tendon repair    TONSILLECTOMY     as child   TOTAL KNEE ARTHROPLASTY  05/16/2012   Procedure: TOTAL KNEE ARTHROPLASTY;  Surgeon: Jacki Cones, MD;  Location: [facility];  Service: Orthopedics;  Laterality: Left;   TRANSURETHRAL RESECTION OF PROSTATE N/A 05/23/2013   Procedure: TRANSURETHRAL RESECTION OF THE PROSTATE WITH GYRUS INSTRUMENTS;  Surgeon: Kathi Ludwig, MD;  Location: [facility];  Service: Urology;  Laterality: N/A;    Allergies  Allergen Reactions   Bee Venom Swelling    SWELLING REACTION UNSPECIFIED     Outpatient Encounter Medications as of 12/19/2022  Medication Sig   acetaminophen (TYLENOL) 500 MG tablet Take 2 tablets (1,000 mg total) by mouth every morning.   aspirin EC 81 MG tablet Take 81 mg by mouth every Monday, Wednesday, and Friday.    Calcium Carbonate (CALCIUM 500 PO) Take 1 capsule by mouth daily. 3 times  a week   cholecalciferol (VITAMIN D) 1000 UNITS tablet Take 2,000 Units by mouth daily.    Coenzyme Q10 200 MG capsule Take 300 mg by mouth 3 (three) times a week.    Flaxseed, Linseed, (FLAX SEED OIL) 1000 MG CAPS Take 1,000 mg by mouth every evening.    Glucosamine-Chondroitin-Vit D3 1500-1200-800 MG-MG-UNIT PACK Take 1 tablet by mouth daily.   rosuvastatin (CRESTOR) 5 MG tablet TAKE ONE TABLET BY MOUTH EVERY OTHER DAY   No facility-administered encounter medications on file as of 12/19/2022.    Review of Systems  Immunization History  Administered Date(s) Administered   Fluad  Quad(high Dose 65+) 02/18/2021, 02/09/2022   Influenza Split 01/31/2013   Influenza Whole 12/24/2009   Influenza, High Dose Seasonal PF 03/01/2016, 02/01/2017, 01/19/2018   Influenza,inj,Quad PF,6+ Mos 02/13/2014, 01/21/2015   Influenza-Unspecified 01/25/2019, 10/06/2021   Moderna SARS-COV2 Booster Vaccination 04/29/2019   Moderna Sars-Covid-2 Vaccination 05/27/2019, 06/27/2019, 03/09/2020, 10/06/2021   PFIZER(Purple Top)SARS-COV-2 Vaccination 01/13/2021   PPD Test 01/14/2014   Pneumococcal Conjugate-13 04/11/2013   Pneumococcal Polysaccharide-23 04/25/2004   Tdap 12/24/2009, 06/17/2020   Zoster Recombinant(Shingrix) 03/06/2017, 01/29/2018   Zoster, Live 01/24/2007   Pertinent  Health Maintenance Due  Topic Date Due   INFLUENZA VACCINE  11/24/2022      12/16/2020    2:57 PM 06/09/2021    2:58 PM 12/15/2021    4:06 PM 06/29/2022    1:56 PM 11/21/2022    1:57 PM  Fall Risk  Falls in the past year? 0 0 0 0 0  Was there an injury with Fall? 0 0 0 0 0  Fall Risk Category Calculator 0 0 0 0 0  Fall Risk Category (Retired) Low Low Low    (RETIRED) Patient Fall Risk Level Low fall risk Low fall risk Low fall risk    Patient at Risk for Falls Due to No Fall Risks No Fall Risks No Fall Risks No Fall Risks No Fall Risks  Fall risk Follow up Falls evaluation completed Falls evaluation completed Falls evaluation completed Falls evaluation completed Falls evaluation completed;Education provided;Falls prevention discussed   Functional Status Survey:    Vitals:   12/19/22 1259  BP: 122/72  Pulse: 75  Resp: 18  Temp: (!) 97.3 F (36.3 C)  SpO2: 95%  Weight: 173 lb (78.5 kg)  Height: 5' 10.5" (1.791 m)   Body mass index is 24.47 kg/m. Physical Exam  Labs reviewed: Recent Labs    06/27/22 0806  NA 141  K 4.0  CL 103  CO2 27  GLUCOSE 104*  BUN 21  CREATININE 0.86  CALCIUM 9.3   Recent Labs    06/27/22 0806  AST 14  ALT 13  BILITOT 1.4*  PROT 6.5   Recent Labs     06/27/22 0806  WBC 6.3  NEUTROABS 4,278  HGB 15.9  HCT 46.4  MCV 91.3  PLT 191   Lab Results  Component Value Date   TSH 2.08 06/27/2022   No results found for: "HGBA1C" Lab Results  Component Value Date   CHOL 147 06/27/2022   HDL 56 06/27/2022   LDLCALC 77 06/27/2022   TRIG 68 06/27/2022   CHOLHDL 2.6 06/27/2022    Significant Diagnostic Results in last 30 days:  VAS [place] LOWER EXTREMITY VENOUS (DVT)  Result Date: 11/22/2022  Lower Venous DVT Study Patient Name:  Rajon Neitzke.  Date of Exam:   11/22/2022 Medical Rec #: [id number]  Accession #:    [id number] Date of Birth: 11-10-1932          Patient Gender: M Patient Age:   37 years Exam Location:  [facility] Procedure:      VAS [place] LOWER EXTREMITY VENOUS (DVT) Referring Phys: Jyasia Markoff --------------------------------------------------------------------------------  Other Indications: Patient reports his left leg has been swollen from the knee                    down for about a month. Tylenol and Aspirin have been                    unsuccessful in reducing pain. Evaluate for DVT. Performing Technologist: Olegario Shearer RVT  Examination Guidelines: A complete evaluation includes B-mode imaging, spectral Doppler, color Doppler, and power Doppler as needed of all accessible portions of each vessel. Bilateral testing is considered an integral part of a complete examination. Limited examinations for reoccurring indications may be performed as noted. The reflux portion of the exam is performed with the patient in reverse Trendelenburg.  +-----+---------------+---------+-----------+----------+--------------+ RIGHTCompressibilityPhasicitySpontaneityPropertiesThrombus Aging +-----+---------------+---------+-----------+----------+--------------+ CFV  Full           Yes      Yes                                 +-----+---------------+---------+-----------+----------+--------------+    +---------+---------------+---------+-----------+----------+--------------+ LEFT     CompressibilityPhasicitySpontaneityPropertiesThrombus Aging +---------+---------------+---------+-----------+----------+--------------+ CFV      Full           Yes      Yes                                 +---------+---------------+---------+-----------+----------+--------------+ SFJ      Full           Yes      Yes                                 +---------+---------------+---------+-----------+----------+--------------+ FV Prox  Full           Yes      Yes                                 +---------+---------------+---------+-----------+----------+--------------+ FV Mid   Full           Yes      Yes                                 +---------+---------------+---------+-----------+----------+--------------+ FV DistalFull           Yes      Yes                                 +---------+---------------+---------+-----------+----------+--------------+ PFV      Full                                                        +---------+---------------+---------+-----------+----------+--------------+ POP      Full  Yes      Yes                                 +---------+---------------+---------+-----------+----------+--------------+ PTV      Full           Yes      Yes                                 +---------+---------------+---------+-----------+----------+--------------+ PERO     Full           Yes      Yes                                 +---------+---------------+---------+-----------+----------+--------------+ Gastroc  Full                                                        +---------+---------------+---------+-----------+----------+--------------+ GSV      Full           Yes      Yes                                 +---------+---------------+---------+-----------+----------+--------------+   Findings reported to Hazle Nordmann, NP via secure  chat at 11:50 am.  Summary: RIGHT: - No evidence of common femoral vein obstruction.  LEFT: - No evidence of deep vein thrombosis in the lower extremity. No indirect evidence of obstruction proximal to the inguinal ligament. - No cystic structure found in the popliteal fossa.  *See table(s) above for measurements and observations. Electronically signed by Waverly Ferrari MD on 11/22/2022 at 8:13:20 PM.    Final     Assessment/Plan There are no diagnoses linked to this encounter.   Family/ staff Communication: ***  Labs/tests ordered:  ***

## 2022-12-19 NOTE — Patient Instructions (Addendum)
Continue wearing compression stocking to left leg " if needed"   Continue tylenol every morning for knee pain  Ok to use blue emu   Elevate legs in recliner  Follow low sodium diet

## 2022-12-29 ENCOUNTER — Other Ambulatory Visit: Payer: Medicare Other

## 2022-12-29 DIAGNOSIS — E785 Hyperlipidemia, unspecified: Secondary | ICD-10-CM | POA: Diagnosis not present

## 2022-12-29 DIAGNOSIS — J410 Simple chronic bronchitis: Secondary | ICD-10-CM | POA: Diagnosis not present

## 2022-12-30 LAB — COMPLETE METABOLIC PANEL WITH GFR
BUN: 21 mg/dL (ref 7–25)
Chloride: 104 mmol/L (ref 98–110)

## 2023-01-04 ENCOUNTER — Non-Acute Institutional Stay: Payer: Medicare Other | Admitting: Internal Medicine

## 2023-01-04 ENCOUNTER — Encounter: Payer: Self-pay | Admitting: Internal Medicine

## 2023-01-04 VITALS — BP 120/68 | HR 79 | Temp 97.6°F | Resp 16 | Ht 70.0 in | Wt 170.0 lb

## 2023-01-04 DIAGNOSIS — J410 Simple chronic bronchitis: Secondary | ICD-10-CM

## 2023-01-04 DIAGNOSIS — I83893 Varicose veins of bilateral lower extremities with other complications: Secondary | ICD-10-CM | POA: Diagnosis not present

## 2023-01-04 DIAGNOSIS — E785 Hyperlipidemia, unspecified: Secondary | ICD-10-CM

## 2023-01-04 DIAGNOSIS — I6523 Occlusion and stenosis of bilateral carotid arteries: Secondary | ICD-10-CM | POA: Diagnosis not present

## 2023-01-04 NOTE — Progress Notes (Signed)
Location:  [facility] of Service:  Clinic (12)  Provider:   Code Status:  Goals of Care:     01/04/2023    1:25 PM  Advanced Directives  Does Patient Have a Medical Advance Directive? Yes  Type of Estate agent of [place];Living will;Out of facility DNR (pink MOST or yellow form)  Does patient want to make changes to medical advance directive? No - Patient declined  Copy of Healthcare Power of Attorney in Chart? No - copy requested     Chief Complaint  Patient presents with   Medical Management of Chronic Issues    Patient is being seen for a 6 month follow up . Patient has been having some swelling in left calf   Immunizations    Patient is due for covid and flu vaccine    HPI: Patient is a 89 y.o. male seen today for medical management of chronic diseases.   Lives in [facility] [place]   Hyperlipidemia Takes Crestor 3/week LDL less then 100 Gilbert Syndrome Mild Elevated Indirect Bilirubin H/o Elevated PSA Has had number of Biopsy and they have been Negative  Chronic Productive cough Had CT scan of Lungs done by Pulmonary which showed peripheral interstitial opacity and septal Thickening. Possible early Fibrosis  Bilateral Leg swelling Left more then right Dopplers Negative Using Ted hoses PRN     Past Medical History:  Diagnosis Date   Arthritis    BPH (benign prostatic hyperplasia)    Elevated PSA    Erectile dysfunction    Gilberts syndrome    elevated bile on liver tests in past   Hyperlipidemia    Inguinal hernia recurrent unilateral    Nocturia    Osteoarthrosis and allied disorders    Other and unspecified hyperlipidemia    Rotator cuff tear, left    partial    Past Surgical History:  Procedure Laterality Date   CARPAL TUNNEL RELEASE Left 06/27/2013   Procedure: LEFT CARPAL TUNNEL RELEASE;  Surgeon: Wyn Forster., MD;  Location: [facility];  Service: Orthopedics;  Laterality: Left;    CATARACT EXTRACTION, BILATERAL  06/03/2015   EYE SURGERY     JOINT REPLACEMENT Left    knee   KNEE ARTHROSCOPY     bilateral   POSTERIOR CERVICAL FUSION/FORAMINOTOMY N/A 11/30/2016   Procedure: Cervical one-Cervical two Posterior Instrumentation and Fusion;  Surgeon: Tressie Stalker, MD;  Location: [facility] OR;  Service: Neurosurgery;  Laterality: N/A;   right hand  1987   tendon repair    TONSILLECTOMY     as child   TOTAL KNEE ARTHROPLASTY  05/16/2012   Procedure: TOTAL KNEE ARTHROPLASTY;  Surgeon: Jacki Cones, MD;  Location: [facility];  Service: Orthopedics;  Laterality: Left;   TRANSURETHRAL RESECTION OF PROSTATE N/A 05/23/2013   Procedure: TRANSURETHRAL RESECTION OF THE PROSTATE WITH GYRUS INSTRUMENTS;  Surgeon: Kathi Ludwig, MD;  Location: [facility];  Service: Urology;  Laterality: N/A;    Allergies  Allergen Reactions   Bee Venom Swelling    SWELLING REACTION UNSPECIFIED     Outpatient Encounter Medications as of 01/04/2023  Medication Sig   acetaminophen (TYLENOL) 500 MG tablet Take 2 tablets (1,000 mg total) by mouth every morning.   aspirin EC 81 MG tablet Take 81 mg by mouth every Monday, Wednesday, and Friday.    Calcium Carbonate (CALCIUM 500 PO) Take 1 capsule by mouth daily. 3 times a week   cholecalciferol (  VITAMIN D) 1000 UNITS tablet Take 2,000 Units by mouth daily.    Coenzyme Q10 200 MG capsule Take 300 mg by mouth 3 (three) times a week.    Flaxseed, Linseed, (FLAX SEED OIL) 1000 MG CAPS Take 1,000 mg by mouth every evening.    Glucosamine-Chondroitin-Vit D3 1500-1200-800 MG-MG-UNIT PACK Take 1 tablet by mouth daily.   rosuvastatin (CRESTOR) 5 MG tablet TAKE ONE TABLET BY MOUTH EVERY OTHER DAY   No facility-administered encounter medications on file as of 01/04/2023.    Review of Systems:  Review of Systems  Constitutional:  Negative for activity change, appetite change and unexpected weight change.  HENT: Negative.    Respiratory:  Negative for cough and  shortness of breath.   Cardiovascular:  Positive for leg swelling.  Gastrointestinal:  Negative for constipation.  Genitourinary:  Negative for frequency.  Musculoskeletal:  Negative for arthralgias, gait problem and myalgias.  Skin: Negative.  Negative for rash.  Neurological:  Negative for dizziness and weakness.  Psychiatric/Behavioral:  Negative for confusion and sleep disturbance.   All other systems reviewed and are negative.   Health Maintenance  Topic Date Due   INFLUENZA VACCINE  11/24/2022   COVID-19 Vaccine (6 - 2023-24 season) 12/25/2022   Medicare Annual Wellness (AWV)  02/03/2023 (Originally 10/03/2019)   DTaP/Tdap/Td (3 - Td or Tdap) 06/17/2030   Pneumonia Vaccine 54+ Years old  Completed   Zoster Vaccines- Shingrix  Completed   HPV VACCINES  Aged Out    Physical Exam: Vitals:   01/04/23 1323  BP: 120/68  Pulse: 79  Resp: 16  Temp: 97.6 F (36.4 C)  TempSrc: Temporal  SpO2: 95%  Weight: 170 lb (77.1 kg)  Height: 5\' 10"  (1.778 m)   Body mass index is 24.39 kg/m. Physical Exam Vitals reviewed.  Constitutional:      Appearance: Normal appearance.  HENT:     Head: Normocephalic.     Nose: Nose normal.     Mouth/Throat:     Mouth: Mucous membranes are moist.     Pharynx: Oropharynx is clear.  Eyes:     Pupils: Pupils are equal, round, and reactive to light.  Cardiovascular:     Rate and Rhythm: Normal rate and regular rhythm.     Pulses: Normal pulses.     Heart sounds: No murmur heard. Pulmonary:     Effort: Pulmonary effort is normal. No respiratory distress.     Breath sounds: Normal breath sounds. No rales.  Abdominal:     General: Abdomen is flat. Bowel sounds are normal.     Palpations: Abdomen is soft.  Musculoskeletal:        General: No swelling.     Cervical back: Neck supple.  Skin:    General: Skin is warm.  Neurological:     General: No focal deficit present.     Mental Status: He is alert and oriented to person, place, and time.   Psychiatric:        Mood and Affect: Mood normal.        Thought Content: Thought content normal.     Labs reviewed: Basic Metabolic Panel: Recent Labs    06/27/22 0806 12/29/22 0820  NA 141 141  K 4.0 4.1  CL 103 104  CO2 27 27  GLUCOSE 104* 96  BUN 21 21  CREATININE 0.86 0.85  CALCIUM 9.3 9.2  TSH 2.08 1.74   Liver Function Tests: Recent Labs    06/27/22 0806 12/29/22 0820  AST  14 16  ALT 13 16  BILITOT 1.4* 1.6*  PROT 6.5 6.5   No results for input(s): "LIPASE", "AMYLASE" in the last 8760 hours. No results for input(s): "AMMONIA" in the last 8760 hours. CBC: Recent Labs    06/27/22 0806 12/29/22 0820  WBC 6.3 5.9  NEUTROABS 4,278 3,918  HGB 15.9 15.1  HCT 46.4 45.3  MCV 91.3 91.7  PLT 191 198   Lipid Panel: Recent Labs    06/27/22 0806 12/29/22 0820  CHOL 147 147  HDL 56 53  LDLCALC 77 80  TRIG 68 64  CHOLHDL 2.6 2.8   No results found for: "HGBA1C"  Procedures since last visit: No results found.  Assessment/Plan 1. Bilateral carotid artery stenosis Continue with Aspirin and Statin - US Carotid Duplex Bilateral; Future  2. Varicose veins of leg with swelling, bilateral Compression stocking PRN Doppler negative  3. Simple chronic bronchitis (HCC) He had CT in 2022 We talked about repeating it to rule out worsening Fibrosis He does not have any symptoms Does not want to follow with Pulmonary  4. Hyperlipidemia, unspecified hyperlipidemia type On statin  5. Gilberts syndrome     Labs/tests ordered:  * No order type specified * Next appt:  Visit date not found

## 2023-01-06 ENCOUNTER — Telehealth: Payer: Medicare Other | Admitting: Internal Medicine

## 2023-01-06 NOTE — Telephone Encounter (Signed)
Let him know I have placed the order. Just signed it They should call him by next week

## 2023-01-06 NOTE — Telephone Encounter (Signed)
Patient called regarding his referral to a heart/vascular specialist. Patient said you were going to send him for a coronary artery check.

## 2023-01-09 ENCOUNTER — Ambulatory Visit (HOSPITAL_BASED_OUTPATIENT_CLINIC_OR_DEPARTMENT_OTHER)
Admission: RE | Admit: 2023-01-09 | Discharge: 2023-01-09 | Disposition: A | Discharge status: Home / Self Care | Payer: Medicare Other | Source: Ambulatory Visit | Attending: Internal Medicine | Admitting: Internal Medicine

## 2023-01-09 DIAGNOSIS — I6523 Occlusion and stenosis of bilateral carotid arteries: Secondary | ICD-10-CM | POA: Diagnosis not present

## 2023-01-10 ENCOUNTER — Telehealth: Payer: Medicare Other

## 2023-01-10 NOTE — Telephone Encounter (Signed)
-----   Message from Mahlon Gammon sent at 01/10/2023  1:46 PM EDT ----- Let him know he had no stenosis anymore in his carotids He does not need any more follow up Aspirin and statin seems to have resolved his issue which he needs to continue.

## 2023-02-13 DIAGNOSIS — Z23 Encounter for immunization: Secondary | ICD-10-CM | POA: Diagnosis not present

## 2023-02-18 DIAGNOSIS — Z23 Encounter for immunization: Secondary | ICD-10-CM | POA: Diagnosis not present

## 2023-03-27 ENCOUNTER — Telehealth: Payer: Medicare Other

## 2023-03-27 DIAGNOSIS — M25531 Pain in right wrist: Secondary | ICD-10-CM | POA: Diagnosis not present

## 2023-03-27 DIAGNOSIS — M25512 Pain in left shoulder: Secondary | ICD-10-CM | POA: Diagnosis not present

## 2023-03-27 DIAGNOSIS — M25532 Pain in left wrist: Secondary | ICD-10-CM | POA: Diagnosis not present

## 2023-03-27 NOTE — Telephone Encounter (Signed)
Patient walked into [facility] at 4:30pm and was bloody from fall in parking lot. Nurse Assistant Dondra Prader came to get me and I grabbed gloves, gauze and simple saline to wipe blood away from patient. I asked patient if he was diabetic or on blood thinner, he denied both. He states that his wife was on campus and didn't know he fell.  Patient had skin tear on left wrist, right wrist was swelling quickly, blood on nails, and blood coming from cut on forehead. Dondra Prader called [facility] to come and speak to patient. Nurse KD came and took over as well as safety employee last name Amenta. I called Amy and she advised patient be seen at [facility] for quickest X-ray and treatment, however [facility] is another option if patient would like to go there as well. I will call patient tomorrow and schedule follow up with Dr.Gupta if she doesn't see him ahead of time. Message sent as FYI.

## 2023-03-29 ENCOUNTER — Other Ambulatory Visit: Payer: Self-pay | Admitting: Internal Medicine

## 2023-03-29 NOTE — Telephone Encounter (Signed)
Patient reports evaluation with [facility]. Reports bilateral wrist fracture. Advised to schedule with Dr. Chales Abrahams with any concerns.

## 2023-03-30 DIAGNOSIS — M25531 Pain in right wrist: Secondary | ICD-10-CM | POA: Diagnosis not present

## 2023-03-30 DIAGNOSIS — S52501A Unspecified fracture of the lower end of right radius, initial encounter for closed fracture: Secondary | ICD-10-CM | POA: Diagnosis not present

## 2023-03-30 DIAGNOSIS — M25532 Pain in left wrist: Secondary | ICD-10-CM | POA: Diagnosis not present

## 2023-03-30 DIAGNOSIS — S52502A Unspecified fracture of the lower end of left radius, initial encounter for closed fracture: Secondary | ICD-10-CM | POA: Diagnosis not present

## 2023-03-31 ENCOUNTER — Ambulatory Visit: Payer: Medicare Other | Admitting: Family

## 2023-04-03 ENCOUNTER — Non-Acute Institutional Stay: Payer: Medicare Other | Admitting: Orthopedic Surgery

## 2023-04-03 ENCOUNTER — Encounter: Payer: Self-pay | Admitting: Orthopedic Surgery

## 2023-04-03 VITALS — BP 126/74 | HR 60 | Temp 97.6°F | Resp 16 | Ht 70.0 in | Wt 171.2 lb

## 2023-04-03 DIAGNOSIS — W19XXXA Unspecified fall, initial encounter: Secondary | ICD-10-CM | POA: Diagnosis not present

## 2023-04-03 DIAGNOSIS — S62102A Fracture of unspecified carpal bone, left wrist, initial encounter for closed fracture: Secondary | ICD-10-CM

## 2023-04-03 DIAGNOSIS — S62101A Fracture of unspecified carpal bone, right wrist, initial encounter for closed fracture: Secondary | ICD-10-CM

## 2023-04-03 DIAGNOSIS — M25512 Pain in left shoulder: Secondary | ICD-10-CM | POA: Diagnosis not present

## 2023-04-03 NOTE — Progress Notes (Signed)
Location:  [facility]/gallery curator of Service:  Clinic [phone]) Provider:  Octavia Heir, NP   Mahlon Gammon, MD  Patient Care Team: Mahlon Gammon, MD as PCP - General (Internal Medicine) Jethro Bolus, MD (Inactive) as Consulting Physician (Urology) Ovidio Kin, MD as Consulting Physician (General Surgery) Aris Lot, MD as Consulting Physician (Dermatology) Adam Phenix, DPM as Consulting Physician (Podiatry)  Extended Emergency Contact Information Primary Emergency Contact: [facility] Address: [address]          [place], [place] [address] [place] Home Phone: [phone] Relation: Spouse  Code Status:  Full code Goals of care: Advanced Directive information    04/03/2023    2:58 PM  Advanced Directives  Does Patient Have a Medical Advance Directive? Yes  Type of Estate agent of [place];Living will;Out of facility DNR (pink MOST or yellow form)  Does patient want to make changes to medical advance directive? No - Patient declined  Copy of Healthcare Power of Attorney in Chart? No - copy requested     Chief Complaint  Patient presents with   Acute Visit    Patient has a fall 03-27-2023 and feels unsteady on feet.     HPI:  Pt is a 89 y.o. male seen today for acute visit due to recent fall with fracture.  12/02 he was walking through [facility] parking lot when he began to lean forward and could not correct his balance. He fell forward landing on his left shoulder, left knee and hands. [place] call provider provider recommended ED evaluation. He refused to go to ED and went to [facility] urgent clinic instead. He apparently broke both wrists. Surgery was not advised. Both wrists were stabilized with gauze and ace wraps. He is able to eat and drink at this time. H/o left TKA, no apparent injury or fracture. He was scheduled to follow up with orthopedics in 2 weeks.  Today, he denies chest pain, shortness of breath or dizziness. Pain to wrists and shoulder stable with tylenol and advil. I had him walk hallways and his gait was steady. Does not ambulate with assistive device. He admits to being a avid walker and walks daily on campus. No other falls since 12/02. He is willing to have PT/OT evaluation. Afebrile. Vitals stable.    Past Medical History:  Diagnosis Date   Arthritis    BPH (benign prostatic hyperplasia)    Elevated PSA    Erectile dysfunction    Gilberts syndrome    elevated bile on liver tests in past   Hyperlipidemia    Inguinal hernia recurrent unilateral    Nocturia    Osteoarthrosis and allied disorders    Other and unspecified hyperlipidemia    Rotator cuff tear, left    partial   Past Surgical History:  Procedure Laterality Date   CARPAL TUNNEL RELEASE Left 06/27/2013   Procedure: LEFT CARPAL TUNNEL RELEASE;  Surgeon: Wyn Forster., MD;  Location: [facility];  Service: Orthopedics;  Laterality: Left;   CATARACT EXTRACTION, BILATERAL  06/03/2015   EYE SURGERY     JOINT REPLACEMENT Left    knee   KNEE ARTHROSCOPY     bilateral   POSTERIOR CERVICAL FUSION/FORAMINOTOMY N/A 11/30/2016   Procedure: Cervical one-Cervical two Posterior Instrumentation and Fusion;  Surgeon: Tressie Stalker, MD;  Location: [facility] OR;  Service: Neurosurgery;  Laterality: N/A;   right hand  1987   tendon repair    TONSILLECTOMY     as child   TOTAL KNEE ARTHROPLASTY  05/16/2012   Procedure: TOTAL KNEE ARTHROPLASTY;  Surgeon: Jacki Cones, MD;  Location: [facility];  Service: Orthopedics;  Laterality: Left;   TRANSURETHRAL RESECTION OF PROSTATE N/A 05/23/2013   Procedure: TRANSURETHRAL RESECTION OF THE PROSTATE WITH GYRUS INSTRUMENTS;  Surgeon: Kathi Ludwig, MD;  Location: [facility];  Service: Urology;  Laterality: N/A;    Allergies  Allergen Reactions   Bee Venom Swelling    SWELLING REACTION UNSPECIFIED     Outpatient  Encounter Medications as of 04/03/2023  Medication Sig   acetaminophen (TYLENOL) 500 MG tablet Take 2 tablets (1,000 mg total) by mouth every morning.   aspirin EC 81 MG tablet Take 81 mg by mouth every Monday, Wednesday, and Friday.    Calcium Carbonate (CALCIUM 500 PO) Take 1 capsule by mouth daily. 3 times a week   cholecalciferol (VITAMIN D) 1000 UNITS tablet Take 2,000 Units by mouth daily.    Coenzyme Q10 200 MG capsule Take 300 mg by mouth 3 (three) times a week.    Flaxseed, Linseed, (FLAX SEED OIL) 1000 MG CAPS Take 1,000 mg by mouth every evening.    Glucosamine-Chondroitin-Vit D3 1500-1200-800 MG-MG-UNIT PACK Take 1 tablet by mouth daily.   rosuvastatin (CRESTOR) 5 MG tablet TAKE ONE TABLET BY MOUTH EVERY OTHER DAY   No facility-administered encounter medications on file as of 04/03/2023.    Review of Systems  Constitutional:  Negative for activity change and appetite change.  HENT: Negative.    Eyes: Negative.   Respiratory:  Negative for shortness of breath and wheezing.   Cardiovascular:  Negative for chest pain and leg swelling.  Gastrointestinal: Negative.   Genitourinary: Negative.   Musculoskeletal:  Positive for arthralgias and gait problem.  Skin:  Positive for wound.  Neurological:  Negative for dizziness, weakness and light-headedness.  Psychiatric/Behavioral:  Negative for confusion and dysphoric mood. The patient is not nervous/anxious.     Immunization History  Administered Date(s) Administered   Fluad Quad(high Dose 65+) 02/18/2021, 02/09/2022   Influenza Split 01/31/2013   Influenza Whole 12/24/2009   Influenza, High Dose Seasonal PF 03/01/2016, 02/01/2017, 01/19/2018   Influenza,inj,Quad PF,6+ Mos 02/13/2014, 01/21/2015   Influenza-Unspecified 01/25/2019, 10/06/2021   Moderna SARS-COV2 Booster Vaccination 04/29/2019   Moderna Sars-Covid-2 Vaccination 05/27/2019, 06/27/2019, 03/09/2020, 10/06/2021   PFIZER(Purple Top)SARS-COV-2 Vaccination 01/13/2021    PPD Test 01/14/2014   Pneumococcal Conjugate-13 04/11/2013   Pneumococcal Polysaccharide-23 04/25/2004   Tdap 12/24/2009, 06/17/2020   Zoster Recombinant(Shingrix) 03/06/2017, 01/29/2018   Zoster, Live 01/24/2007   Pertinent  Health Maintenance Due  Topic Date Due   INFLUENZA VACCINE  11/24/2022      06/29/2022    1:56 PM 11/21/2022    1:57 PM 12/20/2022    2:02 PM 01/04/2023    1:24 PM 04/03/2023    2:58 PM  Fall Risk  Falls in the past year? 0 0 0 0 1  Was there an injury with Fall? 0 0 0 0 1  Fall Risk Category Calculator 0 0 0 0 2  Patient at Risk for Falls Due to No Fall Risks No Fall Risks History of fall(s) No Fall Risks Impaired balance/gait;Impaired mobility  Fall risk Follow up Falls evaluation completed Falls evaluation completed;Education provided;Falls prevention discussed Falls evaluation completed;Education provided Falls evaluation completed Falls evaluation completed;Education provided;Falls prevention discussed   Functional Status Survey:    Vitals:   04/03/23 1451  BP: 126/74  Pulse: 60  Resp: 16  Temp: 97.6 F (36.4 C)  SpO2: 95%  Weight: 171 lb 3.2 oz (77.7 kg)  Height: 5\' 10"  (1.778 m)   Body mass index is 24.56 kg/m. Physical Exam Vitals reviewed.  Constitutional:      General: He is not in acute distress. HENT:     Head: Normocephalic. Left periorbital erythema present.  Eyes:     General:        Right eye: No discharge.        Left eye: No discharge.     Pupils: Pupils are equal, round, and reactive to light.  Cardiovascular:     Rate and Rhythm: Normal rate and regular rhythm.     Pulses: Normal pulses.     Heart sounds: Normal heart sounds.  Pulmonary:     Effort: Pulmonary effort is normal. No respiratory distress.     Breath sounds: Normal breath sounds. No wheezing or rales.  Abdominal:     Palpations: Abdomen is soft.  Musculoskeletal:     Left shoulder: Tenderness present. No swelling or crepitus. Decreased range of motion.  Normal strength.     Cervical back: Neck supple.     Left knee: Swelling present. No crepitus. Normal range of motion. No tenderness.     Right lower leg: No edema.     Left lower leg: No edema.     Comments: UTA due to dressings, fingers to both hands mildly bruised and swollen, full sensation and ROM, radial pulse 1+ bilaterally. Mild MCL tenderness and swelling.   Skin:    General: Skin is warm.     Capillary Refill: Capillary refill takes less than 2 seconds.  Neurological:     General: No focal deficit present.     Mental Status: He is alert and oriented to person, place, and time.     Motor: No weakness.     Gait: Gait normal.  Psychiatric:        Mood and Affect: Mood normal.     Labs reviewed: Recent Labs    06/27/22 0806 12/29/22 0820  NA 141 141  K 4.0 4.1  CL 103 104  CO2 27 27  GLUCOSE 104* 96  BUN 21 21  CREATININE 0.86 0.85  CALCIUM 9.3 9.2   Recent Labs    06/27/22 0806 12/29/22 0820  AST 14 16  ALT 13 16  BILITOT 1.4* 1.6*  PROT 6.5 6.5   Recent Labs    06/27/22 0806 12/29/22 0820  WBC 6.3 5.9  NEUTROABS 4,278 3,918  HGB 15.9 15.1  HCT 46.4 45.3  MCV 91.3 91.7  PLT 191 198   Lab Results  Component Value Date   TSH 1.74 12/29/2022   No results found for: "HGBA1C" Lab Results  Component Value Date   CHOL 147 12/29/2022   HDL 53 12/29/2022   LDLCALC 80 12/29/2022   TRIG 64 12/29/2022   CHOLHDL 2.8 12/29/2022    Significant Diagnostic Results in last 30 days:  No results found.  Assessment/Plan 1. Closed fracture of both wrists, initial encounter - 12/02 mechanical fall  - bilateral fractures confirmed by [facility]> notes requested - apparently non-surgical interventions - cont tylenol and advil for pain   2. Acute pain of left shoulder - see above - followed by [facility]  3. Fall, initial encounter - see above - he denies dizziness  - BP stable - PT/OT evaluation     Family/ staff Communication: plan  discussed  with patient and nurse  Labs/tests ordered:  PT/OT evaluation

## 2023-04-03 NOTE — Patient Instructions (Signed)
Continue [facility] visits  If pain increased you may take Tylenol 1000 mg 1-3 times daily as needed OR advil (take with food) 400 mg 1-3 times daily  I will order PT/OT evaluation  If you become dizzy or falling more> please contact [facility]

## 2023-04-04 DIAGNOSIS — M6281 Muscle weakness (generalized): Secondary | ICD-10-CM | POA: Diagnosis not present

## 2023-04-04 DIAGNOSIS — Z9181 History of falling: Secondary | ICD-10-CM | POA: Diagnosis not present

## 2023-04-04 DIAGNOSIS — R2681 Unsteadiness on feet: Secondary | ICD-10-CM | POA: Diagnosis not present

## 2023-04-06 DIAGNOSIS — R2681 Unsteadiness on feet: Secondary | ICD-10-CM | POA: Diagnosis not present

## 2023-04-06 DIAGNOSIS — S52501A Unspecified fracture of the lower end of right radius, initial encounter for closed fracture: Secondary | ICD-10-CM | POA: Diagnosis not present

## 2023-04-06 DIAGNOSIS — S52502A Unspecified fracture of the lower end of left radius, initial encounter for closed fracture: Secondary | ICD-10-CM | POA: Diagnosis not present

## 2023-04-06 DIAGNOSIS — M6281 Muscle weakness (generalized): Secondary | ICD-10-CM | POA: Diagnosis not present

## 2023-04-06 DIAGNOSIS — Z9181 History of falling: Secondary | ICD-10-CM | POA: Diagnosis not present

## 2023-04-07 DIAGNOSIS — M6281 Muscle weakness (generalized): Secondary | ICD-10-CM | POA: Diagnosis not present

## 2023-04-07 DIAGNOSIS — Z9181 History of falling: Secondary | ICD-10-CM | POA: Diagnosis not present

## 2023-04-07 DIAGNOSIS — R2681 Unsteadiness on feet: Secondary | ICD-10-CM | POA: Diagnosis not present

## 2023-04-11 DIAGNOSIS — R2681 Unsteadiness on feet: Secondary | ICD-10-CM | POA: Diagnosis not present

## 2023-04-11 DIAGNOSIS — Z9181 History of falling: Secondary | ICD-10-CM | POA: Diagnosis not present

## 2023-04-11 DIAGNOSIS — M6281 Muscle weakness (generalized): Secondary | ICD-10-CM | POA: Diagnosis not present

## 2023-04-13 DIAGNOSIS — R2681 Unsteadiness on feet: Secondary | ICD-10-CM | POA: Diagnosis not present

## 2023-04-13 DIAGNOSIS — M25512 Pain in left shoulder: Secondary | ICD-10-CM | POA: Diagnosis not present

## 2023-04-13 DIAGNOSIS — M6281 Muscle weakness (generalized): Secondary | ICD-10-CM | POA: Diagnosis not present

## 2023-04-13 DIAGNOSIS — Z9181 History of falling: Secondary | ICD-10-CM | POA: Diagnosis not present

## 2023-04-14 DIAGNOSIS — M6281 Muscle weakness (generalized): Secondary | ICD-10-CM | POA: Diagnosis not present

## 2023-04-14 DIAGNOSIS — R2681 Unsteadiness on feet: Secondary | ICD-10-CM | POA: Diagnosis not present

## 2023-04-14 DIAGNOSIS — Z9181 History of falling: Secondary | ICD-10-CM | POA: Diagnosis not present

## 2023-04-16 DIAGNOSIS — M6281 Muscle weakness (generalized): Secondary | ICD-10-CM | POA: Diagnosis not present

## 2023-04-16 DIAGNOSIS — Z9181 History of falling: Secondary | ICD-10-CM | POA: Diagnosis not present

## 2023-04-16 DIAGNOSIS — R2681 Unsteadiness on feet: Secondary | ICD-10-CM | POA: Diagnosis not present

## 2023-04-24 DIAGNOSIS — Z9181 History of falling: Secondary | ICD-10-CM | POA: Diagnosis not present

## 2023-04-24 DIAGNOSIS — R2681 Unsteadiness on feet: Secondary | ICD-10-CM | POA: Diagnosis not present

## 2023-04-24 DIAGNOSIS — M6281 Muscle weakness (generalized): Secondary | ICD-10-CM | POA: Diagnosis not present

## 2023-04-27 DIAGNOSIS — M6281 Muscle weakness (generalized): Secondary | ICD-10-CM | POA: Diagnosis not present

## 2023-04-27 DIAGNOSIS — R41841 Cognitive communication deficit: Secondary | ICD-10-CM | POA: Diagnosis not present

## 2023-04-27 DIAGNOSIS — Z9181 History of falling: Secondary | ICD-10-CM | POA: Diagnosis not present

## 2023-04-27 DIAGNOSIS — R2681 Unsteadiness on feet: Secondary | ICD-10-CM | POA: Diagnosis not present

## 2023-05-01 DIAGNOSIS — M6281 Muscle weakness (generalized): Secondary | ICD-10-CM | POA: Diagnosis not present

## 2023-05-01 DIAGNOSIS — R2681 Unsteadiness on feet: Secondary | ICD-10-CM | POA: Diagnosis not present

## 2023-05-01 DIAGNOSIS — R41841 Cognitive communication deficit: Secondary | ICD-10-CM | POA: Diagnosis not present

## 2023-05-01 DIAGNOSIS — Z9181 History of falling: Secondary | ICD-10-CM | POA: Diagnosis not present

## 2023-05-03 DIAGNOSIS — M6281 Muscle weakness (generalized): Secondary | ICD-10-CM | POA: Diagnosis not present

## 2023-05-03 DIAGNOSIS — R2681 Unsteadiness on feet: Secondary | ICD-10-CM | POA: Diagnosis not present

## 2023-05-03 DIAGNOSIS — Z9181 History of falling: Secondary | ICD-10-CM | POA: Diagnosis not present

## 2023-05-03 DIAGNOSIS — R41841 Cognitive communication deficit: Secondary | ICD-10-CM | POA: Diagnosis not present

## 2023-05-04 DIAGNOSIS — S52501D Unspecified fracture of the lower end of right radius, subsequent encounter for closed fracture with routine healing: Secondary | ICD-10-CM | POA: Diagnosis not present

## 2023-05-04 DIAGNOSIS — S52502D Unspecified fracture of the lower end of left radius, subsequent encounter for closed fracture with routine healing: Secondary | ICD-10-CM | POA: Diagnosis not present

## 2023-05-09 DIAGNOSIS — Z9181 History of falling: Secondary | ICD-10-CM | POA: Diagnosis not present

## 2023-05-09 DIAGNOSIS — R2681 Unsteadiness on feet: Secondary | ICD-10-CM | POA: Diagnosis not present

## 2023-05-09 DIAGNOSIS — R41841 Cognitive communication deficit: Secondary | ICD-10-CM | POA: Diagnosis not present

## 2023-05-09 DIAGNOSIS — M6281 Muscle weakness (generalized): Secondary | ICD-10-CM | POA: Diagnosis not present

## 2023-05-11 DIAGNOSIS — Z9181 History of falling: Secondary | ICD-10-CM | POA: Diagnosis not present

## 2023-05-11 DIAGNOSIS — R41841 Cognitive communication deficit: Secondary | ICD-10-CM | POA: Diagnosis not present

## 2023-05-11 DIAGNOSIS — R2681 Unsteadiness on feet: Secondary | ICD-10-CM | POA: Diagnosis not present

## 2023-05-11 DIAGNOSIS — M6281 Muscle weakness (generalized): Secondary | ICD-10-CM | POA: Diagnosis not present

## 2023-05-12 DIAGNOSIS — Z9181 History of falling: Secondary | ICD-10-CM | POA: Diagnosis not present

## 2023-05-12 DIAGNOSIS — R41841 Cognitive communication deficit: Secondary | ICD-10-CM | POA: Diagnosis not present

## 2023-05-12 DIAGNOSIS — M6281 Muscle weakness (generalized): Secondary | ICD-10-CM | POA: Diagnosis not present

## 2023-05-12 DIAGNOSIS — R2681 Unsteadiness on feet: Secondary | ICD-10-CM | POA: Diagnosis not present

## 2023-05-15 DIAGNOSIS — R41841 Cognitive communication deficit: Secondary | ICD-10-CM | POA: Diagnosis not present

## 2023-05-15 DIAGNOSIS — M6281 Muscle weakness (generalized): Secondary | ICD-10-CM | POA: Diagnosis not present

## 2023-05-15 DIAGNOSIS — R2681 Unsteadiness on feet: Secondary | ICD-10-CM | POA: Diagnosis not present

## 2023-05-15 DIAGNOSIS — Z9181 History of falling: Secondary | ICD-10-CM | POA: Diagnosis not present

## 2023-05-16 DIAGNOSIS — R2681 Unsteadiness on feet: Secondary | ICD-10-CM | POA: Diagnosis not present

## 2023-05-16 DIAGNOSIS — M6281 Muscle weakness (generalized): Secondary | ICD-10-CM | POA: Diagnosis not present

## 2023-05-16 DIAGNOSIS — R41841 Cognitive communication deficit: Secondary | ICD-10-CM | POA: Diagnosis not present

## 2023-05-16 DIAGNOSIS — Z9181 History of falling: Secondary | ICD-10-CM | POA: Diagnosis not present

## 2023-05-17 DIAGNOSIS — R2681 Unsteadiness on feet: Secondary | ICD-10-CM | POA: Diagnosis not present

## 2023-05-17 DIAGNOSIS — M6281 Muscle weakness (generalized): Secondary | ICD-10-CM | POA: Diagnosis not present

## 2023-05-17 DIAGNOSIS — R41841 Cognitive communication deficit: Secondary | ICD-10-CM | POA: Diagnosis not present

## 2023-05-17 DIAGNOSIS — Z9181 History of falling: Secondary | ICD-10-CM | POA: Diagnosis not present

## 2023-05-22 DIAGNOSIS — Z9181 History of falling: Secondary | ICD-10-CM | POA: Diagnosis not present

## 2023-05-22 DIAGNOSIS — M6281 Muscle weakness (generalized): Secondary | ICD-10-CM | POA: Diagnosis not present

## 2023-05-22 DIAGNOSIS — R2681 Unsteadiness on feet: Secondary | ICD-10-CM | POA: Diagnosis not present

## 2023-05-22 DIAGNOSIS — R41841 Cognitive communication deficit: Secondary | ICD-10-CM | POA: Diagnosis not present

## 2023-05-23 DIAGNOSIS — M6281 Muscle weakness (generalized): Secondary | ICD-10-CM | POA: Diagnosis not present

## 2023-05-23 DIAGNOSIS — Z9181 History of falling: Secondary | ICD-10-CM | POA: Diagnosis not present

## 2023-05-23 DIAGNOSIS — R2681 Unsteadiness on feet: Secondary | ICD-10-CM | POA: Diagnosis not present

## 2023-05-23 DIAGNOSIS — R41841 Cognitive communication deficit: Secondary | ICD-10-CM | POA: Diagnosis not present

## 2023-05-24 DIAGNOSIS — Z9181 History of falling: Secondary | ICD-10-CM | POA: Diagnosis not present

## 2023-05-24 DIAGNOSIS — M6281 Muscle weakness (generalized): Secondary | ICD-10-CM | POA: Diagnosis not present

## 2023-05-24 DIAGNOSIS — R2681 Unsteadiness on feet: Secondary | ICD-10-CM | POA: Diagnosis not present

## 2023-05-24 DIAGNOSIS — R41841 Cognitive communication deficit: Secondary | ICD-10-CM | POA: Diagnosis not present

## 2023-05-26 DIAGNOSIS — R2681 Unsteadiness on feet: Secondary | ICD-10-CM | POA: Diagnosis not present

## 2023-05-26 DIAGNOSIS — M6281 Muscle weakness (generalized): Secondary | ICD-10-CM | POA: Diagnosis not present

## 2023-05-26 DIAGNOSIS — Z9181 History of falling: Secondary | ICD-10-CM | POA: Diagnosis not present

## 2023-05-26 DIAGNOSIS — R41841 Cognitive communication deficit: Secondary | ICD-10-CM | POA: Diagnosis not present

## 2023-05-29 DIAGNOSIS — M6281 Muscle weakness (generalized): Secondary | ICD-10-CM | POA: Diagnosis not present

## 2023-05-29 DIAGNOSIS — Z9181 History of falling: Secondary | ICD-10-CM | POA: Diagnosis not present

## 2023-05-29 DIAGNOSIS — R2681 Unsteadiness on feet: Secondary | ICD-10-CM | POA: Diagnosis not present

## 2023-05-31 DIAGNOSIS — M6281 Muscle weakness (generalized): Secondary | ICD-10-CM | POA: Diagnosis not present

## 2023-05-31 DIAGNOSIS — R2681 Unsteadiness on feet: Secondary | ICD-10-CM | POA: Diagnosis not present

## 2023-05-31 DIAGNOSIS — Z9181 History of falling: Secondary | ICD-10-CM | POA: Diagnosis not present

## 2023-06-06 DIAGNOSIS — Z9181 History of falling: Secondary | ICD-10-CM | POA: Diagnosis not present

## 2023-06-06 DIAGNOSIS — R2681 Unsteadiness on feet: Secondary | ICD-10-CM | POA: Diagnosis not present

## 2023-06-06 DIAGNOSIS — M6281 Muscle weakness (generalized): Secondary | ICD-10-CM | POA: Diagnosis not present

## 2023-06-08 DIAGNOSIS — Z9181 History of falling: Secondary | ICD-10-CM | POA: Diagnosis not present

## 2023-06-08 DIAGNOSIS — R2681 Unsteadiness on feet: Secondary | ICD-10-CM | POA: Diagnosis not present

## 2023-06-08 DIAGNOSIS — M6281 Muscle weakness (generalized): Secondary | ICD-10-CM | POA: Diagnosis not present

## 2023-06-13 DIAGNOSIS — M6281 Muscle weakness (generalized): Secondary | ICD-10-CM | POA: Diagnosis not present

## 2023-06-13 DIAGNOSIS — R2681 Unsteadiness on feet: Secondary | ICD-10-CM | POA: Diagnosis not present

## 2023-06-13 DIAGNOSIS — Z9181 History of falling: Secondary | ICD-10-CM | POA: Diagnosis not present

## 2023-06-15 DIAGNOSIS — Z9181 History of falling: Secondary | ICD-10-CM | POA: Diagnosis not present

## 2023-06-15 DIAGNOSIS — R2681 Unsteadiness on feet: Secondary | ICD-10-CM | POA: Diagnosis not present

## 2023-06-15 DIAGNOSIS — M6281 Muscle weakness (generalized): Secondary | ICD-10-CM | POA: Diagnosis not present

## 2023-06-20 DIAGNOSIS — R2681 Unsteadiness on feet: Secondary | ICD-10-CM | POA: Diagnosis not present

## 2023-06-20 DIAGNOSIS — Z9181 History of falling: Secondary | ICD-10-CM | POA: Diagnosis not present

## 2023-06-20 DIAGNOSIS — M6281 Muscle weakness (generalized): Secondary | ICD-10-CM | POA: Diagnosis not present

## 2023-06-22 DIAGNOSIS — R2681 Unsteadiness on feet: Secondary | ICD-10-CM | POA: Diagnosis not present

## 2023-06-22 DIAGNOSIS — Z9181 History of falling: Secondary | ICD-10-CM | POA: Diagnosis not present

## 2023-06-22 DIAGNOSIS — M6281 Muscle weakness (generalized): Secondary | ICD-10-CM | POA: Diagnosis not present

## 2023-06-26 DIAGNOSIS — S52502A Unspecified fracture of the lower end of left radius, initial encounter for closed fracture: Secondary | ICD-10-CM | POA: Diagnosis not present

## 2023-06-26 DIAGNOSIS — M6281 Muscle weakness (generalized): Secondary | ICD-10-CM | POA: Diagnosis not present

## 2023-06-26 DIAGNOSIS — S52501A Unspecified fracture of the lower end of right radius, initial encounter for closed fracture: Secondary | ICD-10-CM | POA: Diagnosis not present

## 2023-06-26 DIAGNOSIS — Z9181 History of falling: Secondary | ICD-10-CM | POA: Diagnosis not present

## 2023-06-26 DIAGNOSIS — R2681 Unsteadiness on feet: Secondary | ICD-10-CM | POA: Diagnosis not present

## 2023-06-28 DIAGNOSIS — R2681 Unsteadiness on feet: Secondary | ICD-10-CM | POA: Diagnosis not present

## 2023-06-28 DIAGNOSIS — S52501A Unspecified fracture of the lower end of right radius, initial encounter for closed fracture: Secondary | ICD-10-CM | POA: Diagnosis not present

## 2023-06-28 DIAGNOSIS — S52502A Unspecified fracture of the lower end of left radius, initial encounter for closed fracture: Secondary | ICD-10-CM | POA: Diagnosis not present

## 2023-06-28 DIAGNOSIS — Z9181 History of falling: Secondary | ICD-10-CM | POA: Diagnosis not present

## 2023-06-28 DIAGNOSIS — M6281 Muscle weakness (generalized): Secondary | ICD-10-CM | POA: Diagnosis not present

## 2023-06-29 ENCOUNTER — Other Ambulatory Visit: Payer: Medicare Other

## 2023-07-02 DIAGNOSIS — Z9181 History of falling: Secondary | ICD-10-CM | POA: Diagnosis not present

## 2023-07-02 DIAGNOSIS — R2681 Unsteadiness on feet: Secondary | ICD-10-CM | POA: Diagnosis not present

## 2023-07-02 DIAGNOSIS — S52502A Unspecified fracture of the lower end of left radius, initial encounter for closed fracture: Secondary | ICD-10-CM | POA: Diagnosis not present

## 2023-07-02 DIAGNOSIS — M6281 Muscle weakness (generalized): Secondary | ICD-10-CM | POA: Diagnosis not present

## 2023-07-02 DIAGNOSIS — S52501A Unspecified fracture of the lower end of right radius, initial encounter for closed fracture: Secondary | ICD-10-CM | POA: Diagnosis not present

## 2023-07-04 DIAGNOSIS — M6281 Muscle weakness (generalized): Secondary | ICD-10-CM | POA: Diagnosis not present

## 2023-07-04 DIAGNOSIS — S52502A Unspecified fracture of the lower end of left radius, initial encounter for closed fracture: Secondary | ICD-10-CM | POA: Diagnosis not present

## 2023-07-04 DIAGNOSIS — S52501A Unspecified fracture of the lower end of right radius, initial encounter for closed fracture: Secondary | ICD-10-CM | POA: Diagnosis not present

## 2023-07-04 DIAGNOSIS — R2681 Unsteadiness on feet: Secondary | ICD-10-CM | POA: Diagnosis not present

## 2023-07-04 DIAGNOSIS — Z9181 History of falling: Secondary | ICD-10-CM | POA: Diagnosis not present

## 2023-07-05 ENCOUNTER — Encounter: Payer: Medicare Other | Admitting: Internal Medicine

## 2023-07-05 NOTE — Progress Notes (Signed)
 error    This encounter was created in error - please disregard.

## 2023-07-11 DIAGNOSIS — S52502A Unspecified fracture of the lower end of left radius, initial encounter for closed fracture: Secondary | ICD-10-CM | POA: Diagnosis not present

## 2023-07-11 DIAGNOSIS — Z9181 History of falling: Secondary | ICD-10-CM | POA: Diagnosis not present

## 2023-07-11 DIAGNOSIS — M6281 Muscle weakness (generalized): Secondary | ICD-10-CM | POA: Diagnosis not present

## 2023-07-11 DIAGNOSIS — R2681 Unsteadiness on feet: Secondary | ICD-10-CM | POA: Diagnosis not present

## 2023-07-11 DIAGNOSIS — S52501A Unspecified fracture of the lower end of right radius, initial encounter for closed fracture: Secondary | ICD-10-CM | POA: Diagnosis not present

## 2023-07-12 DIAGNOSIS — R2681 Unsteadiness on feet: Secondary | ICD-10-CM | POA: Diagnosis not present

## 2023-07-12 DIAGNOSIS — M6281 Muscle weakness (generalized): Secondary | ICD-10-CM | POA: Diagnosis not present

## 2023-07-12 DIAGNOSIS — Z9181 History of falling: Secondary | ICD-10-CM | POA: Diagnosis not present

## 2023-07-12 DIAGNOSIS — S52502A Unspecified fracture of the lower end of left radius, initial encounter for closed fracture: Secondary | ICD-10-CM | POA: Diagnosis not present

## 2023-07-12 DIAGNOSIS — S52501A Unspecified fracture of the lower end of right radius, initial encounter for closed fracture: Secondary | ICD-10-CM | POA: Diagnosis not present

## 2023-07-17 ENCOUNTER — Other Ambulatory Visit: Payer: Self-pay | Admitting: Internal Medicine

## 2023-07-17 DIAGNOSIS — S52501A Unspecified fracture of the lower end of right radius, initial encounter for closed fracture: Secondary | ICD-10-CM | POA: Diagnosis not present

## 2023-07-17 DIAGNOSIS — S52502A Unspecified fracture of the lower end of left radius, initial encounter for closed fracture: Secondary | ICD-10-CM | POA: Diagnosis not present

## 2023-07-17 DIAGNOSIS — R2681 Unsteadiness on feet: Secondary | ICD-10-CM | POA: Diagnosis not present

## 2023-07-17 DIAGNOSIS — Z9181 History of falling: Secondary | ICD-10-CM | POA: Diagnosis not present

## 2023-07-17 DIAGNOSIS — M6281 Muscle weakness (generalized): Secondary | ICD-10-CM | POA: Diagnosis not present

## 2023-07-20 DIAGNOSIS — M6281 Muscle weakness (generalized): Secondary | ICD-10-CM | POA: Diagnosis not present

## 2023-07-20 DIAGNOSIS — S52502A Unspecified fracture of the lower end of left radius, initial encounter for closed fracture: Secondary | ICD-10-CM | POA: Diagnosis not present

## 2023-07-20 DIAGNOSIS — S52501A Unspecified fracture of the lower end of right radius, initial encounter for closed fracture: Secondary | ICD-10-CM | POA: Diagnosis not present

## 2023-07-20 DIAGNOSIS — R2681 Unsteadiness on feet: Secondary | ICD-10-CM | POA: Diagnosis not present

## 2023-07-20 DIAGNOSIS — Z9181 History of falling: Secondary | ICD-10-CM | POA: Diagnosis not present

## 2023-07-25 DIAGNOSIS — R2681 Unsteadiness on feet: Secondary | ICD-10-CM | POA: Diagnosis not present

## 2023-07-25 DIAGNOSIS — Z9181 History of falling: Secondary | ICD-10-CM | POA: Diagnosis not present

## 2023-07-25 DIAGNOSIS — M6281 Muscle weakness (generalized): Secondary | ICD-10-CM | POA: Diagnosis not present

## 2023-07-25 DIAGNOSIS — S52502A Unspecified fracture of the lower end of left radius, initial encounter for closed fracture: Secondary | ICD-10-CM | POA: Diagnosis not present

## 2023-07-25 DIAGNOSIS — S52501A Unspecified fracture of the lower end of right radius, initial encounter for closed fracture: Secondary | ICD-10-CM | POA: Diagnosis not present

## 2023-07-27 DIAGNOSIS — M6281 Muscle weakness (generalized): Secondary | ICD-10-CM | POA: Diagnosis not present

## 2023-07-27 DIAGNOSIS — R2681 Unsteadiness on feet: Secondary | ICD-10-CM | POA: Diagnosis not present

## 2023-07-27 DIAGNOSIS — S52502A Unspecified fracture of the lower end of left radius, initial encounter for closed fracture: Secondary | ICD-10-CM | POA: Diagnosis not present

## 2023-07-27 DIAGNOSIS — S52501A Unspecified fracture of the lower end of right radius, initial encounter for closed fracture: Secondary | ICD-10-CM | POA: Diagnosis not present

## 2023-07-27 DIAGNOSIS — Z9181 History of falling: Secondary | ICD-10-CM | POA: Diagnosis not present

## 2023-08-01 DIAGNOSIS — R2681 Unsteadiness on feet: Secondary | ICD-10-CM | POA: Diagnosis not present

## 2023-08-01 DIAGNOSIS — Z9181 History of falling: Secondary | ICD-10-CM | POA: Diagnosis not present

## 2023-08-01 DIAGNOSIS — S52501A Unspecified fracture of the lower end of right radius, initial encounter for closed fracture: Secondary | ICD-10-CM | POA: Diagnosis not present

## 2023-08-01 DIAGNOSIS — S52502A Unspecified fracture of the lower end of left radius, initial encounter for closed fracture: Secondary | ICD-10-CM | POA: Diagnosis not present

## 2023-08-01 DIAGNOSIS — M6281 Muscle weakness (generalized): Secondary | ICD-10-CM | POA: Diagnosis not present

## 2023-08-03 DIAGNOSIS — Z9181 History of falling: Secondary | ICD-10-CM | POA: Diagnosis not present

## 2023-08-03 DIAGNOSIS — M6281 Muscle weakness (generalized): Secondary | ICD-10-CM | POA: Diagnosis not present

## 2023-08-03 DIAGNOSIS — S52501A Unspecified fracture of the lower end of right radius, initial encounter for closed fracture: Secondary | ICD-10-CM | POA: Diagnosis not present

## 2023-08-03 DIAGNOSIS — R2681 Unsteadiness on feet: Secondary | ICD-10-CM | POA: Diagnosis not present

## 2023-08-03 DIAGNOSIS — S52502A Unspecified fracture of the lower end of left radius, initial encounter for closed fracture: Secondary | ICD-10-CM | POA: Diagnosis not present

## 2023-08-08 DIAGNOSIS — M6281 Muscle weakness (generalized): Secondary | ICD-10-CM | POA: Diagnosis not present

## 2023-08-08 DIAGNOSIS — S52502A Unspecified fracture of the lower end of left radius, initial encounter for closed fracture: Secondary | ICD-10-CM | POA: Diagnosis not present

## 2023-08-08 DIAGNOSIS — Z9181 History of falling: Secondary | ICD-10-CM | POA: Diagnosis not present

## 2023-08-08 DIAGNOSIS — S52501A Unspecified fracture of the lower end of right radius, initial encounter for closed fracture: Secondary | ICD-10-CM | POA: Diagnosis not present

## 2023-08-08 DIAGNOSIS — R2681 Unsteadiness on feet: Secondary | ICD-10-CM | POA: Diagnosis not present

## 2023-08-10 DIAGNOSIS — R2681 Unsteadiness on feet: Secondary | ICD-10-CM | POA: Diagnosis not present

## 2023-08-10 DIAGNOSIS — Z9181 History of falling: Secondary | ICD-10-CM | POA: Diagnosis not present

## 2023-08-10 DIAGNOSIS — M6281 Muscle weakness (generalized): Secondary | ICD-10-CM | POA: Diagnosis not present

## 2023-08-10 DIAGNOSIS — S52502A Unspecified fracture of the lower end of left radius, initial encounter for closed fracture: Secondary | ICD-10-CM | POA: Diagnosis not present

## 2023-08-10 DIAGNOSIS — S52501A Unspecified fracture of the lower end of right radius, initial encounter for closed fracture: Secondary | ICD-10-CM | POA: Diagnosis not present

## 2023-08-15 DIAGNOSIS — R2681 Unsteadiness on feet: Secondary | ICD-10-CM | POA: Diagnosis not present

## 2023-08-15 DIAGNOSIS — Z9181 History of falling: Secondary | ICD-10-CM | POA: Diagnosis not present

## 2023-08-15 DIAGNOSIS — S52502A Unspecified fracture of the lower end of left radius, initial encounter for closed fracture: Secondary | ICD-10-CM | POA: Diagnosis not present

## 2023-08-15 DIAGNOSIS — M6281 Muscle weakness (generalized): Secondary | ICD-10-CM | POA: Diagnosis not present

## 2023-08-15 DIAGNOSIS — S52501A Unspecified fracture of the lower end of right radius, initial encounter for closed fracture: Secondary | ICD-10-CM | POA: Diagnosis not present

## 2023-08-17 DIAGNOSIS — S52501A Unspecified fracture of the lower end of right radius, initial encounter for closed fracture: Secondary | ICD-10-CM | POA: Diagnosis not present

## 2023-08-17 DIAGNOSIS — R2681 Unsteadiness on feet: Secondary | ICD-10-CM | POA: Diagnosis not present

## 2023-08-17 DIAGNOSIS — M6281 Muscle weakness (generalized): Secondary | ICD-10-CM | POA: Diagnosis not present

## 2023-08-17 DIAGNOSIS — Z9181 History of falling: Secondary | ICD-10-CM | POA: Diagnosis not present

## 2023-08-17 DIAGNOSIS — S52502A Unspecified fracture of the lower end of left radius, initial encounter for closed fracture: Secondary | ICD-10-CM | POA: Diagnosis not present

## 2023-08-22 DIAGNOSIS — Z9181 History of falling: Secondary | ICD-10-CM | POA: Diagnosis not present

## 2023-08-22 DIAGNOSIS — S52502A Unspecified fracture of the lower end of left radius, initial encounter for closed fracture: Secondary | ICD-10-CM | POA: Diagnosis not present

## 2023-08-22 DIAGNOSIS — S52501A Unspecified fracture of the lower end of right radius, initial encounter for closed fracture: Secondary | ICD-10-CM | POA: Diagnosis not present

## 2023-08-22 DIAGNOSIS — R2681 Unsteadiness on feet: Secondary | ICD-10-CM | POA: Diagnosis not present

## 2023-08-22 DIAGNOSIS — M6281 Muscle weakness (generalized): Secondary | ICD-10-CM | POA: Diagnosis not present

## 2023-08-24 DIAGNOSIS — S52501A Unspecified fracture of the lower end of right radius, initial encounter for closed fracture: Secondary | ICD-10-CM | POA: Diagnosis not present

## 2023-08-24 DIAGNOSIS — S52502A Unspecified fracture of the lower end of left radius, initial encounter for closed fracture: Secondary | ICD-10-CM | POA: Diagnosis not present

## 2023-08-24 DIAGNOSIS — Z9181 History of falling: Secondary | ICD-10-CM | POA: Diagnosis not present

## 2023-08-24 DIAGNOSIS — R2681 Unsteadiness on feet: Secondary | ICD-10-CM | POA: Diagnosis not present

## 2023-08-24 DIAGNOSIS — M6281 Muscle weakness (generalized): Secondary | ICD-10-CM | POA: Diagnosis not present

## 2023-08-28 DIAGNOSIS — S52501A Unspecified fracture of the lower end of right radius, initial encounter for closed fracture: Secondary | ICD-10-CM | POA: Diagnosis not present

## 2023-08-28 DIAGNOSIS — R2681 Unsteadiness on feet: Secondary | ICD-10-CM | POA: Diagnosis not present

## 2023-08-28 DIAGNOSIS — Z9181 History of falling: Secondary | ICD-10-CM | POA: Diagnosis not present

## 2023-08-28 DIAGNOSIS — M6281 Muscle weakness (generalized): Secondary | ICD-10-CM | POA: Diagnosis not present

## 2023-08-28 DIAGNOSIS — S52502A Unspecified fracture of the lower end of left radius, initial encounter for closed fracture: Secondary | ICD-10-CM | POA: Diagnosis not present

## 2023-08-30 DIAGNOSIS — S52501A Unspecified fracture of the lower end of right radius, initial encounter for closed fracture: Secondary | ICD-10-CM | POA: Diagnosis not present

## 2023-08-30 DIAGNOSIS — M6281 Muscle weakness (generalized): Secondary | ICD-10-CM | POA: Diagnosis not present

## 2023-08-30 DIAGNOSIS — Z9181 History of falling: Secondary | ICD-10-CM | POA: Diagnosis not present

## 2023-08-30 DIAGNOSIS — S52502A Unspecified fracture of the lower end of left radius, initial encounter for closed fracture: Secondary | ICD-10-CM | POA: Diagnosis not present

## 2023-08-30 DIAGNOSIS — R2681 Unsteadiness on feet: Secondary | ICD-10-CM | POA: Diagnosis not present

## 2023-09-06 DIAGNOSIS — Z9181 History of falling: Secondary | ICD-10-CM | POA: Diagnosis not present

## 2023-09-06 DIAGNOSIS — S52501A Unspecified fracture of the lower end of right radius, initial encounter for closed fracture: Secondary | ICD-10-CM | POA: Diagnosis not present

## 2023-09-06 DIAGNOSIS — S52502A Unspecified fracture of the lower end of left radius, initial encounter for closed fracture: Secondary | ICD-10-CM | POA: Diagnosis not present

## 2023-09-06 DIAGNOSIS — R2681 Unsteadiness on feet: Secondary | ICD-10-CM | POA: Diagnosis not present

## 2023-09-06 DIAGNOSIS — M6281 Muscle weakness (generalized): Secondary | ICD-10-CM | POA: Diagnosis not present

## 2023-09-08 DIAGNOSIS — S52502A Unspecified fracture of the lower end of left radius, initial encounter for closed fracture: Secondary | ICD-10-CM | POA: Diagnosis not present

## 2023-09-08 DIAGNOSIS — R2681 Unsteadiness on feet: Secondary | ICD-10-CM | POA: Diagnosis not present

## 2023-09-08 DIAGNOSIS — S52501A Unspecified fracture of the lower end of right radius, initial encounter for closed fracture: Secondary | ICD-10-CM | POA: Diagnosis not present

## 2023-09-08 DIAGNOSIS — M6281 Muscle weakness (generalized): Secondary | ICD-10-CM | POA: Diagnosis not present

## 2023-09-08 DIAGNOSIS — Z9181 History of falling: Secondary | ICD-10-CM | POA: Diagnosis not present

## 2023-09-12 DIAGNOSIS — S52501A Unspecified fracture of the lower end of right radius, initial encounter for closed fracture: Secondary | ICD-10-CM | POA: Diagnosis not present

## 2023-09-12 DIAGNOSIS — M6281 Muscle weakness (generalized): Secondary | ICD-10-CM | POA: Diagnosis not present

## 2023-09-12 DIAGNOSIS — S52502A Unspecified fracture of the lower end of left radius, initial encounter for closed fracture: Secondary | ICD-10-CM | POA: Diagnosis not present

## 2023-09-12 DIAGNOSIS — Z9181 History of falling: Secondary | ICD-10-CM | POA: Diagnosis not present

## 2023-09-12 DIAGNOSIS — R2681 Unsteadiness on feet: Secondary | ICD-10-CM | POA: Diagnosis not present

## 2023-09-14 DIAGNOSIS — D1801 Hemangioma of skin and subcutaneous tissue: Secondary | ICD-10-CM | POA: Diagnosis not present

## 2023-09-14 DIAGNOSIS — R2681 Unsteadiness on feet: Secondary | ICD-10-CM | POA: Diagnosis not present

## 2023-09-14 DIAGNOSIS — L814 Other melanin hyperpigmentation: Secondary | ICD-10-CM | POA: Diagnosis not present

## 2023-09-14 DIAGNOSIS — M6281 Muscle weakness (generalized): Secondary | ICD-10-CM | POA: Diagnosis not present

## 2023-09-14 DIAGNOSIS — S52502A Unspecified fracture of the lower end of left radius, initial encounter for closed fracture: Secondary | ICD-10-CM | POA: Diagnosis not present

## 2023-09-14 DIAGNOSIS — Z9181 History of falling: Secondary | ICD-10-CM | POA: Diagnosis not present

## 2023-09-14 DIAGNOSIS — C44519 Basal cell carcinoma of skin of other part of trunk: Secondary | ICD-10-CM | POA: Diagnosis not present

## 2023-09-14 DIAGNOSIS — Z85828 Personal history of other malignant neoplasm of skin: Secondary | ICD-10-CM | POA: Diagnosis not present

## 2023-09-14 DIAGNOSIS — L570 Actinic keratosis: Secondary | ICD-10-CM | POA: Diagnosis not present

## 2023-09-14 DIAGNOSIS — L820 Inflamed seborrheic keratosis: Secondary | ICD-10-CM | POA: Diagnosis not present

## 2023-09-14 DIAGNOSIS — L821 Other seborrheic keratosis: Secondary | ICD-10-CM | POA: Diagnosis not present

## 2023-09-14 DIAGNOSIS — S52501A Unspecified fracture of the lower end of right radius, initial encounter for closed fracture: Secondary | ICD-10-CM | POA: Diagnosis not present

## 2023-09-19 DIAGNOSIS — R2681 Unsteadiness on feet: Secondary | ICD-10-CM | POA: Diagnosis not present

## 2023-09-19 DIAGNOSIS — S52502A Unspecified fracture of the lower end of left radius, initial encounter for closed fracture: Secondary | ICD-10-CM | POA: Diagnosis not present

## 2023-09-19 DIAGNOSIS — Z9181 History of falling: Secondary | ICD-10-CM | POA: Diagnosis not present

## 2023-09-19 DIAGNOSIS — S52501A Unspecified fracture of the lower end of right radius, initial encounter for closed fracture: Secondary | ICD-10-CM | POA: Diagnosis not present

## 2023-09-19 DIAGNOSIS — M6281 Muscle weakness (generalized): Secondary | ICD-10-CM | POA: Diagnosis not present

## 2023-09-21 DIAGNOSIS — R2681 Unsteadiness on feet: Secondary | ICD-10-CM | POA: Diagnosis not present

## 2023-09-21 DIAGNOSIS — S52502A Unspecified fracture of the lower end of left radius, initial encounter for closed fracture: Secondary | ICD-10-CM | POA: Diagnosis not present

## 2023-09-21 DIAGNOSIS — Z9181 History of falling: Secondary | ICD-10-CM | POA: Diagnosis not present

## 2023-09-21 DIAGNOSIS — S52501A Unspecified fracture of the lower end of right radius, initial encounter for closed fracture: Secondary | ICD-10-CM | POA: Diagnosis not present

## 2023-09-21 DIAGNOSIS — M6281 Muscle weakness (generalized): Secondary | ICD-10-CM | POA: Diagnosis not present

## 2023-09-28 ENCOUNTER — Telehealth: Payer: Self-pay

## 2023-09-28 NOTE — Telephone Encounter (Signed)
 Spoke with patient because patient stating that he went down for his appointment for labs and there we no labs that was order. Patient also stating that he does has an appointment on Monday at 7:45 pm and wanting to ask if can put the lab order in for the lab.   Message sent to Marguerite Shiley, MD

## 2023-10-02 ENCOUNTER — Other Ambulatory Visit: Payer: Self-pay | Admitting: Internal Medicine

## 2023-10-02 ENCOUNTER — Other Ambulatory Visit

## 2023-10-02 DIAGNOSIS — I83893 Varicose veins of bilateral lower extremities with other complications: Secondary | ICD-10-CM | POA: Diagnosis not present

## 2023-10-02 DIAGNOSIS — J410 Simple chronic bronchitis: Secondary | ICD-10-CM

## 2023-10-02 DIAGNOSIS — E785 Hyperlipidemia, unspecified: Secondary | ICD-10-CM | POA: Diagnosis not present

## 2023-10-02 DIAGNOSIS — I6523 Occlusion and stenosis of bilateral carotid arteries: Secondary | ICD-10-CM | POA: Diagnosis not present

## 2023-10-02 NOTE — Telephone Encounter (Signed)
 I have put the labs  He probably has to come on Thurs to get labs

## 2023-10-02 NOTE — Telephone Encounter (Signed)
 Mychart message sent to patient. I called patient and he stated he had labs today.

## 2023-10-03 LAB — CBC WITH DIFFERENTIAL/PLATELET
Absolute Lymphocytes: 1436 {cells}/uL (ref 850–3900)
Absolute Monocytes: 484 {cells}/uL (ref 200–950)
Basophils Absolute: 39 {cells}/uL (ref 0–200)
Basophils Relative: 0.7 %
Eosinophils Absolute: 110 {cells}/uL (ref 15–500)
Eosinophils Relative: 2 %
HCT: 45.6 % (ref 38.5–50.0)
Hemoglobin: 14.6 g/dL (ref 13.2–17.1)
MCH: 30.3 pg (ref 27.0–33.0)
MCHC: 32 g/dL (ref 32.0–36.0)
MCV: 94.6 fL (ref 80.0–100.0)
MPV: 10.4 fL (ref 7.5–12.5)
Monocytes Relative: 8.8 %
Neutro Abs: 3432 {cells}/uL (ref 1500–7800)
Neutrophils Relative %: 62.4 %
Platelets: 190 10*3/uL (ref 140–400)
RBC: 4.82 10*6/uL (ref 4.20–5.80)
RDW: 11.8 % (ref 11.0–15.0)
Total Lymphocyte: 26.1 %
WBC: 5.5 10*3/uL (ref 3.8–10.8)

## 2023-10-03 LAB — COMPREHENSIVE METABOLIC PANEL WITH GFR
AG Ratio: 2.4 (calc) (ref 1.0–2.5)
ALT: 14 U/L (ref 9–46)
AST: 15 U/L (ref 10–35)
Albumin: 4.4 g/dL (ref 3.6–5.1)
Alkaline phosphatase (APISO): 66 U/L (ref 35–144)
BUN: 24 mg/dL (ref 7–25)
CO2: 29 mmol/L (ref 20–32)
Calcium: 9.4 mg/dL (ref 8.6–10.3)
Chloride: 105 mmol/L (ref 98–110)
Creat: 0.77 mg/dL (ref 0.70–1.22)
Globulin: 1.8 g/dL — ABNORMAL LOW (ref 1.9–3.7)
Glucose, Bld: 108 mg/dL — ABNORMAL HIGH (ref 65–99)
Potassium: 4.3 mmol/L (ref 3.5–5.3)
Sodium: 141 mmol/L (ref 135–146)
Total Bilirubin: 1.2 mg/dL (ref 0.2–1.2)
Total Protein: 6.2 g/dL (ref 6.1–8.1)
eGFR: 86 mL/min/{1.73_m2} (ref >=60)

## 2023-10-03 LAB — LIPID PANEL
Cholesterol: 153 mg/dL (ref <200)
HDL: 59 mg/dL (ref >=40)
LDL Cholesterol (Calc): 81 mg/dL
Non-HDL Cholesterol (Calc): 94 mg/dL (ref <130)
Total CHOL/HDL Ratio: 2.6 (calc) (ref <5.0)
Triglycerides: 58 mg/dL (ref <150)

## 2023-10-03 LAB — TSH: TSH: 1.65 m[IU]/L (ref 0.40–4.50)

## 2023-10-03 NOTE — Patient Instructions (Signed)
 Please schedule an annual wellness visit with Ulyses Gandy, NP on July 2025 at [facility] . Last annual wellness visit was  October 03, 2019 . During this visit the Ulyses Gandy, NP will review your health care needs and focus on scheduling screenings that you may need, along with updating/recommending immunizations for you.      Labs will be done on April 01, 2024 at [facility] 7:45 am

## 2023-10-04 ENCOUNTER — Encounter: Payer: Self-pay | Admitting: Internal Medicine

## 2023-10-04 ENCOUNTER — Ambulatory Visit: Admitting: Internal Medicine

## 2023-10-04 VITALS — BP 135/87 | HR 71 | Temp 97.5°F | Resp 18 | Ht 70.0 in | Wt 171.5 lb

## 2023-10-04 DIAGNOSIS — S62102S Fracture of unspecified carpal bone, left wrist, sequela: Secondary | ICD-10-CM | POA: Diagnosis not present

## 2023-10-04 DIAGNOSIS — N400 Enlarged prostate without lower urinary tract symptoms: Secondary | ICD-10-CM | POA: Diagnosis not present

## 2023-10-04 DIAGNOSIS — J410 Simple chronic bronchitis: Secondary | ICD-10-CM

## 2023-10-04 DIAGNOSIS — E785 Hyperlipidemia, unspecified: Secondary | ICD-10-CM | POA: Diagnosis not present

## 2023-10-04 DIAGNOSIS — S62101S Fracture of unspecified carpal bone, right wrist, sequela: Secondary | ICD-10-CM | POA: Diagnosis not present

## 2023-10-04 NOTE — Progress Notes (Signed)
 Location:   [facility] curator of Service:   Clinic  Provider:   Code Status:  Goals of Care:     04/03/2023    2:58 PM  Advanced Directives  Does Patient Have a Medical Advance Directive? Yes  Type of Estate agent of [place];Living will;Out of facility DNR (pink MOST or yellow form)  Does patient want to make changes to medical advance directive? No - Patient declined  Copy of Healthcare Power of Attorney in Chart? No - copy requested     Chief Complaint  Patient presents with   Medical Management of Chronic Issues     6 Month follow with labs. In addition needs to discuss Medicare Annual Wellness Visit    HPI: Patient is a 89 y.o. male seen today for medical management of chronic diseases.   Lives [place] [facility] [place] Bilateral Distal Radius Fracture  in 12/24 Healed now  Patient fell in the parking lot and sustained Bilateral Wrist  fractures Recent Xrays shows Healed fractures No brace now Still has weakness and little swelling in his Right Wrist Working with therapy Has not being Driving Doing his ADLS with help of his Wife    Hyperlipidemia Takes Crestor  3/week LDL less then 100  Gilbert Syndrome Mild Elevated Indirect Bilirubin H/o Elevated PSA Has had number of Biopsy and they have been Negative  Chronic Productive cough Had CT scan of Lungs done by Pulmonary which showed peripheral interstitial opacity and septal Thickening. Possible early Fibrosis   Bilateral Leg swelling Left more then right Dopplers Negative Using Ted hoses PRN  No Other issues No recent Falls  Past Medical History:  Diagnosis Date   Arthritis    BPH (benign prostatic hyperplasia)    Elevated PSA    Erectile dysfunction    Gilberts syndrome    elevated bile on liver tests in past   Hyperlipidemia    Inguinal hernia recurrent unilateral    Nocturia    Osteoarthrosis and allied disorders    Other and unspecified hyperlipidemia     Rotator cuff tear, left    partial    Past Surgical History:  Procedure Laterality Date   CARPAL TUNNEL RELEASE Left 06/27/2013   Procedure: LEFT CARPAL TUNNEL RELEASE;  Surgeon: Amelie Baize., MD;  Location: [facility];  Service: Orthopedics;  Laterality: Left;   CATARACT EXTRACTION, BILATERAL  06/03/2015   EYE SURGERY     JOINT REPLACEMENT Left    knee   KNEE ARTHROSCOPY     bilateral   POSTERIOR CERVICAL FUSION/FORAMINOTOMY N/A 11/30/2016   Procedure: Cervical one-Cervical two Posterior Instrumentation and Fusion;  Surgeon: Garry Kansas, MD;  Location: [facility] OR;  Service: Neurosurgery;  Laterality: N/A;   right hand  1987   tendon repair    TONSILLECTOMY     as child   TOTAL KNEE ARTHROPLASTY  05/16/2012   Procedure: TOTAL KNEE ARTHROPLASTY;  Surgeon: Florencia Hunter, MD;  Location: [facility];  Service: Orthopedics;  Laterality: Left;   TRANSURETHRAL RESECTION OF PROSTATE N/A 05/23/2013   Procedure: TRANSURETHRAL RESECTION OF THE PROSTATE WITH GYRUS INSTRUMENTS;  Surgeon: Edmund Gouge, MD;  Location: [facility];  Service: Urology;  Laterality: N/A;    Allergies  Allergen Reactions   Bee Venom Swelling    SWELLING REACTION UNSPECIFIED     Outpatient Encounter Medications as of 10/04/2023  Medication Sig   acetaminophen  (TYLENOL ) 500 MG tablet Take 2 tablets (1,000  mg total) by mouth every morning.   aspirin EC 81 MG tablet Take 81 mg by mouth every Monday, Wednesday, and Friday.    Calcium  Carbonate (CALCIUM  500 PO) Take 1 capsule by mouth daily. 3 times a week   cholecalciferol  (VITAMIN D ) 1000 UNITS tablet Take 2,000 Units by mouth daily.    Coenzyme Q10 200 MG capsule Take 300 mg by mouth 3 (three) times a week.    Flaxseed, Linseed, (FLAX SEED OIL) 1000 MG CAPS Take 1,000 mg by mouth every evening.    Glucosamine-Chondroitin-Vit D3 1500-1200-800 MG-MG-UNIT PACK Take 1 tablet by mouth daily.   rosuvastatin  (CRESTOR ) 5 MG tablet TAKE ONE TABLET BY MOUTH  EVERY OTHER DAY   No facility-administered encounter medications on file as of 10/04/2023.    Review of Systems:  Review of Systems  Constitutional:  Negative for activity change, appetite change and unexpected weight change.  HENT: Negative.    Respiratory:  Negative for cough and shortness of breath.   Cardiovascular:  Negative for leg swelling.  Gastrointestinal:  Negative for constipation.  Genitourinary:  Negative for frequency.  Musculoskeletal:  Negative for arthralgias, gait problem and myalgias.  Skin: Negative.  Negative for rash.  Neurological:  Negative for dizziness and weakness.  Psychiatric/Behavioral:  Negative for confusion and sleep disturbance.   All other systems reviewed and are negative.   Health Maintenance  Topic Date Due   Medicare Annual Wellness (AWV)  10/03/2019   COVID-19 Vaccine (6 - 2024-25 season) 12/25/2023 (Originally 12/25/2022)   INFLUENZA VACCINE  11/24/2023   DTaP/Tdap/Td (3 - Td or Tdap) 06/17/2030   Pneumonia Vaccine 64+ Years old  Completed   Zoster Vaccines- Shingrix   Completed   HPV VACCINES  Aged Out   Meningococcal B Vaccine  Aged Out    Physical Exam: Vitals:   10/04/23 0814  BP: 135/87  Pulse: 71  Resp: 18  Temp: (!) 97.5 F (36.4 C)  SpO2: 96%  Weight: 171 lb 8 oz (77.8 kg)  Height: 5' 10 (1.778 m)   Body mass index is 24.61 kg/m. Physical Exam Vitals reviewed.  Constitutional:      Appearance: Normal appearance.  HENT:     Head: Normocephalic.     Right Ear: Tympanic membrane normal.     Left Ear: Tympanic membrane normal.     Nose: Nose normal.     Mouth/Throat:     Mouth: Mucous membranes are moist.     Pharynx: Oropharynx is clear.  Eyes:     Pupils: Pupils are equal, round, and reactive to light.  Cardiovascular:     Rate and Rhythm: Normal rate and regular rhythm.     Pulses: Normal pulses.     Heart sounds: No murmur heard. Pulmonary:     Effort: Pulmonary effort is normal. No respiratory distress.      Breath sounds: Normal breath sounds. No rales.     Comments: Rales Bilateral in the Lungs Abdominal:     General: Abdomen is flat. Bowel sounds are normal.     Palpations: Abdomen is soft.  Musculoskeletal:        General: No swelling.     Cervical back: Neck supple.     Comments: Mild edema Bilateral  Some arthritis changes in the Hands but good grip  Skin:    General: Skin is warm.  Neurological:     General: No focal deficit present.     Mental Status: He is alert and oriented to person, place, and  time.  Psychiatric:        Mood and Affect: Mood normal.        Thought Content: Thought content normal.     Labs reviewed: Basic Metabolic Panel: Recent Labs    12/29/22 0820 10/02/23 0810  NA 141 141  K 4.1 4.3  CL 104 105  CO2 27 29  GLUCOSE 96 108*  BUN 21 24  CREATININE 0.85 0.77  CALCIUM  9.2 9.4  TSH 1.74 1.65   Liver Function Tests: Recent Labs    12/29/22 0820 10/02/23 0810  AST 16 15  ALT 16 14  BILITOT 1.6* 1.2  PROT 6.5 6.2   No results for input(s): LIPASE, AMYLASE in the last 8760 hours. No results for input(s): AMMONIA in the last 8760 hours. CBC: Recent Labs    12/29/22 0820 10/02/23 0810  WBC 5.9 5.5  NEUTROABS 3,918 3,432  HGB 15.1 14.6  HCT 45.3 45.6  MCV 91.7 94.6  PLT 198 190   Lipid Panel: Recent Labs    12/29/22 0820 10/02/23 0810  CHOL 147 153  HDL 53 59  LDLCALC 80 81  TRIG 64 58  CHOLHDL 2.8 2.6   No results found for: HGBA1C  Procedures since last visit: No results found.  Assessment/Plan 1. Hyperlipidemia, unspecified hyperlipidemia type (Primary) Crestor    2. Simple chronic bronchitis (HCC) No Active symptoms   3. Closed fracture of both wrists, sequela Doing well   4. Gilberts syndrome   5. Benign prostatic hyperplasia without lower urinary tract symptoms Symptoms stable    Labs/tests ordered:  * No order type specified * Next appt:  Visit date not found

## 2023-10-13 ENCOUNTER — Other Ambulatory Visit: Payer: Self-pay | Admitting: Internal Medicine

## 2023-10-17 ENCOUNTER — Ambulatory Visit (INDEPENDENT_AMBULATORY_CARE_PROVIDER_SITE_OTHER): Admitting: Podiatry

## 2023-10-17 ENCOUNTER — Encounter: Payer: Self-pay | Admitting: Podiatry

## 2023-10-17 VITALS — Ht 70.0 in | Wt 171.5 lb

## 2023-10-17 DIAGNOSIS — M79675 Pain in left toe(s): Secondary | ICD-10-CM

## 2023-10-17 DIAGNOSIS — L600 Ingrowing nail: Secondary | ICD-10-CM | POA: Diagnosis not present

## 2023-10-17 DIAGNOSIS — M79674 Pain in right toe(s): Secondary | ICD-10-CM | POA: Diagnosis not present

## 2023-10-17 DIAGNOSIS — B351 Tinea unguium: Secondary | ICD-10-CM | POA: Diagnosis not present

## 2023-10-17 MED ORDER — NEOMYCIN-POLYMYXIN-HC 3.5-10000-1 OT SUSP: OTIC | 0 refills | Status: AC

## 2023-10-17 NOTE — Patient Instructions (Addendum)

## 2023-10-17 NOTE — Progress Notes (Signed)
  Subjective:  Patient ID: Cameron JINNY Yvetta Mickey., male    DOB: 06-02-1932,  MRN: [id number]  Chief Complaint  Patient presents with   Ingrown Toenail    NP- L foot 1st toe possible toenail removal, with phenol, and nail trim    89 y.o. male presents with the above complaint. History confirmed with patient.  All his nails are thickened and elongated cause pain and discomfort in shoe gear while ambulating.  He is unable to cut them.  The left great toenail in particular he would like to have removed permanently, he has had this were on the right side before as well  Objective:  Physical Exam: warm, good capillary refill, no trophic changes or ulcerative lesions, normal DP and PT pulses, and normal sensory exam. Left Foot: dystrophic yellowed discolored nail plates with subungual debris and severely dystrophic rams horn type nail of the first toe remaining toes are mycotic thickened and show subungual debris Right Foot: dystrophic yellowed discolored nail plates with subungual debris and previous right great toe total matricectomy   Assessment:   1. Pain due to onychomycosis of toenails of both feet      Plan:  Patient was evaluated and treated and all questions answered.  Discussed the etiology and treatment options for the condition in detail with the patient.  I do not think oral or topical therapy would be beneficial.. Recommended debridement of the nails today. Sharp and mechanical debridement performed of all painful and mycotic nails today. Nails debrided in length and thickness using a nail nipper to level of comfort.  Regarding his left hallux nail which was severely thickened and had a rams horn type shape to it he elected today for total permanent removal of the nail which she had on his right side previously and has done well.  Following consent and sterile prep with alcohol the left hallux was anesthetized with 1.5 cc each of 2% lidocaine  and 0.5% Marcaine  plain in a 50-50 mixture.   It was then prepared with Betadine exsanguinated and tourniquet secured around the base of the toe.  The nail plate was removed and phenol matricectomy was performed to destroy the entire nailbed.  Post care instructions given.  Cortisporin prescribed per pharmacy.  Follow-up as needed if issues.   Return if symptoms worsen or fail to improve.

## 2023-10-18 ENCOUNTER — Telehealth: Payer: Self-pay | Admitting: Podiatry

## 2023-10-18 NOTE — Telephone Encounter (Signed)
 Patient was suppose to have a prescription sent in yesterday to [facility].  States it is still not there.

## 2023-12-19 DIAGNOSIS — M2042 Other hammer toe(s) (acquired), left foot: Secondary | ICD-10-CM | POA: Diagnosis not present

## 2023-12-19 DIAGNOSIS — M2041 Other hammer toe(s) (acquired), right foot: Secondary | ICD-10-CM | POA: Diagnosis not present

## 2023-12-19 DIAGNOSIS — B351 Tinea unguium: Secondary | ICD-10-CM | POA: Diagnosis not present

## 2024-03-19 DIAGNOSIS — M25561 Pain in right knee: Secondary | ICD-10-CM | POA: Diagnosis not present

## 2024-04-01 ENCOUNTER — Telehealth: Payer: Self-pay | Admitting: *Deleted

## 2024-04-01 NOTE — Telephone Encounter (Signed)
 Copied from CRM #[id number]. Topic: General - Other >> Apr 01, 2024 12:56 PM Chiquita SQUIBB wrote: Reason for CRM: Patient is calling in with help of the nurse, needing a order for physical therapy due to falls and his right knee giving out on him faxed to [phone]

## 2024-04-03 ENCOUNTER — Non-Acute Institutional Stay: Payer: Self-pay | Admitting: Internal Medicine

## 2024-04-03 VITALS — BP 124/68 | HR 79 | Temp 98.3°F | Resp 18 | Ht 70.0 in | Wt 175.4 lb

## 2024-04-03 DIAGNOSIS — E785 Hyperlipidemia, unspecified: Secondary | ICD-10-CM | POA: Diagnosis not present

## 2024-04-03 DIAGNOSIS — M1711 Unilateral primary osteoarthritis, right knee: Secondary | ICD-10-CM | POA: Diagnosis not present

## 2024-04-03 DIAGNOSIS — H6123 Impacted cerumen, bilateral: Secondary | ICD-10-CM

## 2024-04-03 DIAGNOSIS — J410 Simple chronic bronchitis: Secondary | ICD-10-CM | POA: Diagnosis not present

## 2024-04-03 MED ORDER — ROSUVASTATIN CALCIUM 5 MG PO TABS: 5.0000 mg | ORAL_TABLET | ORAL | Status: AC

## 2024-04-03 NOTE — Patient Instructions (Addendum)
 Labs will be done on 04/08/24 at [facility] at 7:45 am  2. Six months follow-up appointment with Charlanne Fredia CROME, MD will be on September 25, 2024 8:20 am at [facility].   3. Medicare Annual Wellness Visit with Mast, Man X, NP  will be on May 30, 2024 at 2:20 pm at [facility].  4 You need RSV and Prevnar 20

## 2024-04-03 NOTE — Progress Notes (Signed)
 Location:  [facility] Service:  Clinic (12)  Provider:   Code Status:  Goals of Care:     04/03/2023    2:58 PM  Advanced Directives  Does Patient Have a Medical Advance Directive? Yes  Type of Estate Agent of [place];Living will;Out of facility DNR (pink MOST or yellow form)  Does patient want to make changes to medical advance directive? No - Patient declined  Copy of Healthcare Power of Attorney in Chart? No - copy requested     Chief Complaint  Patient presents with   Medical Management of Chronic Issues    Six Months Follow-up with Labs, needs medicare annual wellness visit, flu and covid vaccine      HPI: Patient is a 89 y.o. male seen today for medical management of chronic diseases.    L[place] [facility] [place] wife Cameron [name]  in 12/24 Healed now   Still cannot drive and close his Hand But able to do his ADLS now  HLD Takes crestor  3/week  Cameron [name] Mild Elevated Indirect Bilirubin H/o Elevated PSA Has had number of Biopsy and they have been Negative  Chronic Productive cough Had CT scan of Lungs done by Pulmonary which showed peripheral interstitial opacity and septal Thickening. Possible early Fibrosis  Discussed the use of AI scribe software for clinical note transcription with the patient, who gave verbal consent to proceed.  History of Present Illness   Cameron Cleckler. is an 89 year old male with severe arthritis who presents with right knee pain and instability. Right knee pain  He has had significant right knee pain and instability that worsened after a fall about three to four weeks ago, with a sensation of the knee giving way. An orthopedist told him the knee x-ray showed severe arthritis, and he received a cortisone injection about a week ago, though he is unsure if it has helped.  He uses a walker and sometimes a cane in the house. He has had two falls in past 4 weeks  mostly due to his knee gave up  He no longer drives and relies on his daughter for transportation. He can shower and eat independently.  He takes Tylenol  daily for pain, aspirin three times a week, and a statin about three times a week.  He denies cough, leg swelling, and urinary issues, and gets up once nightly to urinate.      Past Medical History:  Diagnosis Date   Arthritis    BPH (benign prostatic hyperplasia)    Elevated PSA    Erectile dysfunction    Gilberts [name]    elevated bile on liver tests in past   Hyperlipidemia    Inguinal hernia recurrent unilateral    Nocturia    Osteoarthrosis and allied disorders    Other and unspecified hyperlipidemia    Rotator cuff tear, left    partial    Past Surgical History:  Procedure Laterality Date   CARPAL TUNNEL RELEASE Left 06/27/2013   Procedure: LEFT CARPAL TUNNEL RELEASE;  Surgeon: Lamar LULLA Leonor Mickey., MD;  Location: [facility];  Service: Orthopedics;  Laterality: Left;   CATARACT EXTRACTION, Cameron  06/03/2015   EYE SURGERY     JOINT REPLACEMENT Left    knee   KNEE ARTHROSCOPY     Cameron   POSTERIOR CERVICAL FUSION/FORAMINOTOMY N/A 11/30/2016   Procedure: Cervical one-Cervical two Posterior Instrumentation and Fusion;  Surgeon: Mavis,  Reyes, MD;  Location: [facility] OR;  Service: Neurosurgery;  Laterality: N/A;   right hand  1987   tendon repair    TONSILLECTOMY     as child   TOTAL KNEE ARTHROPLASTY  05/16/2012   Procedure: TOTAL KNEE ARTHROPLASTY;  Surgeon: Tanda DELENA Heading, MD;  Location: [facility];  Service: Orthopedics;  Laterality: Left;   TRANSURETHRAL RESECTION OF PROSTATE N/A 05/23/2013   Procedure: TRANSURETHRAL RESECTION OF THE PROSTATE WITH GYRUS INSTRUMENTS;  Surgeon: Arlena LILLETTE Gal, MD;  Location: [facility];  Service: Urology;  Laterality: N/A;    Allergies  Allergen Reactions   Bee Venom Swelling    SWELLING REACTION UNSPECIFIED     Outpatient Encounter Medications as of 04/03/2024   Medication Sig   acetaminophen  (TYLENOL ) 500 MG tablet Take 2 tablets (1,000 mg total) by mouth every morning.   aspirin EC 81 MG tablet Take 81 mg by mouth every Monday, Wednesday, and Friday.    Calcium  Carbonate (CALCIUM  500 PO) Take 1 capsule by mouth daily. 3 times a week   cholecalciferol  (VITAMIN D ) 1000 UNITS tablet Take 2,000 Units by mouth daily.    Coenzyme Q10 200 MG capsule Take 300 mg by mouth 3 (three) times a week.    Flaxseed, Linseed, (FLAX SEED OIL) 1000 MG CAPS Take 1,000 mg by mouth every evening.    Glucosamine-Chondroitin-Vit D3 1500-1200-800 MG-MG-UNIT PACK Take 1 tablet by mouth daily.   neomycin -polymyxin-hydrocortisone (CORTISPORIN) 3.5-10000-1 OTIC suspension Apply 1-2 drops daily after soaking and cover with bandaid   rosuvastatin  (CRESTOR ) 5 MG tablet Take 1 tablet (5 mg total) by mouth 3 (three) times a week.   [DISCONTINUED] rosuvastatin  (CRESTOR ) 5 MG tablet TAKE ONE TABLET BY MOUTH EVERY OTHER DAY   No facility-administered encounter medications on file as of 04/03/2024.    Review of Systems:  Review of Systems  Constitutional:  Negative for activity change, appetite change and unexpected weight change.  HENT: Negative.    Respiratory:  Negative for cough and shortness of breath.   Cardiovascular:  Negative for leg swelling.  Gastrointestinal:  Negative for constipation.  Genitourinary:  Negative for frequency.  Musculoskeletal:  Positive for arthralgias and gait problem. Negative for myalgias.  Skin: Negative.  Negative for rash.  Neurological:  Negative for dizziness and weakness.  Psychiatric/Behavioral:  Negative for confusion and sleep disturbance.   All other systems reviewed and are negative.   Health Maintenance  Topic Date Due   Medicare Annual Wellness (AWV)  10/03/2019   Influenza Vaccine  11/24/2023   COVID-19 Vaccine (6 - 2025-26 season) 12/25/2023   DTaP/Tdap/Td (3 - Td or Tdap) 06/17/2030   Pneumococcal Vaccine: 50+ Years   Completed   Zoster Vaccines- Shingrix   Completed   Meningococcal B Vaccine  Aged Out    Physical Exam: Vitals:   04/03/24 0823  BP: 124/68  Pulse: 79  Resp: 18  Temp: 98.3 F (36.8 C)  SpO2: 95%  Weight: 175 lb 6.4 oz (79.6 kg)  Height: 5' 10 (1.778 m)   Body mass index is 25.17 kg/m. Physical Exam Vitals reviewed.  Constitutional:      Appearance: Normal appearance.  HENT:     Head: Normocephalic.     Right Ear: Tympanic membrane normal.     Left Ear: Tympanic membrane normal.     Nose: Nose normal.     Mouth/Throat:     Mouth: Mucous membranes are moist.     Pharynx: Oropharynx is clear.  Eyes:  Pupils: Pupils are equal, round, and reactive to light.  Cardiovascular:     Rate and Rhythm: Normal rate and regular rhythm.     Pulses: Normal pulses.     Heart sounds: No murmur heard. Pulmonary:     Effort: Pulmonary effort is normal. No respiratory distress.     Breath sounds: Normal breath sounds. No rales.  Abdominal:     General: Abdomen is flat. Bowel sounds are normal.     Palpations: Abdomen is soft.  Musculoskeletal:        General: No swelling.     Cervical back: Neck supple.  Skin:    General: Skin is warm.  Neurological:     General: No focal deficit present.     Mental Status: He is alert and oriented to person, place, and time.  Psychiatric:        Mood and Affect: Mood normal.        Thought Content: Thought content normal.     Labs reviewed: Basic Metabolic Panel: Recent Labs    10/02/23 0810  NA 141  K 4.3  CL 105  CO2 29  GLUCOSE 108*  BUN 24  CREATININE 0.77  CALCIUM  9.4  TSH 1.65   Liver Function Tests: Recent Labs    10/02/23 0810  AST 15  ALT 14  BILITOT 1.2  PROT 6.2   No results for input(s): LIPASE, AMYLASE in the last 8760 hours. No results for input(s): AMMONIA in the last 8760 hours. CBC: Recent Labs    10/02/23 0810  WBC 5.5  NEUTROABS 3,432  HGB 14.6  HCT 45.6  MCV 94.6  PLT 190   Lipid  Panel: Recent Labs    10/02/23 0810  CHOL 153  HDL 59  LDLCALC 81  TRIG 58  CHOLHDL 2.6   No results found for: HGBA1C  Procedures since last visit: No results found.  Assessment/Plan 1. Hyperlipidemia, unspecified hyperlipidemia type (Primary) On Crestor  - TSH - Lipid panel - COMPLETE METABOLIC PANEL WITHOUT GFR - CBC with Differential/Platelet  2. Simple chronic bronchitis (HCC) Not symptomatic now Needs Prevnar 20 and RSV - TSH  3. Arthritis of right knee Tylenol  PRN Therapy Referral made Just saw Ortho and had it injected Continue using Walker 4. Gilberts [name]   5. Cameron impacted cerumen Wax removed          Labs/tests ordered:  * No order type specified * Next appt:  Visit date not found

## 2024-04-11 LAB — COMPLETE METABOLIC PANEL WITHOUT GFR
AG Ratio: 2.2 (calc) (ref 1.0–2.5)
ALT: 13 U/L (ref 9–46)
AST: 15 U/L (ref 10–35)
Albumin: 4.6 g/dL (ref 3.6–5.1)
Alkaline phosphatase (APISO): 74 U/L (ref 35–144)
BUN: 22 mg/dL (ref 7–25)
CO2: 31 mmol/L (ref 20–32)
Calcium: 9.8 mg/dL (ref 8.6–10.3)
Chloride: 101 mmol/L (ref 98–110)
Creat: 0.76 mg/dL (ref 0.70–1.22)
Globulin: 2.1 g/dL (ref 1.9–3.7)
Glucose, Bld: 116 mg/dL — ABNORMAL HIGH (ref 65–99)
Potassium: 4.3 mmol/L (ref 3.5–5.3)
Sodium: 140 mmol/L (ref 135–146)
Total Bilirubin: 1.3 mg/dL — ABNORMAL HIGH (ref 0.2–1.2)
Total Protein: 6.7 g/dL (ref 6.1–8.1)

## 2024-04-11 LAB — CBC WITH DIFFERENTIAL/PLATELET
Absolute Lymphocytes: 1459 {cells}/uL (ref 850–3900)
Absolute Monocytes: 521 {cells}/uL (ref 200–950)
Basophils Absolute: 33 {cells}/uL (ref 0–200)
Basophils Relative: 0.5 %
Eosinophils Absolute: 112 {cells}/uL (ref 15–500)
Eosinophils Relative: 1.7 %
HCT: 46.7 % (ref 39.4–51.1)
Hemoglobin: 15.2 g/dL (ref 13.2–17.1)
MCH: 29.9 pg (ref 27.0–33.0)
MCHC: 32.5 g/dL (ref 31.6–35.4)
MCV: 91.9 fL (ref 81.4–101.7)
MPV: 10.2 fL (ref 7.5–12.5)
Monocytes Relative: 7.9 %
Neutro Abs: 4475 {cells}/uL (ref 1500–7800)
Neutrophils Relative %: 67.8 %
Platelets: 224 Thousand/uL (ref 140–400)
RBC: 5.08 Million/uL (ref 4.20–5.80)
RDW: 12.1 % (ref 11.0–15.0)
Total Lymphocyte: 22.1 %
WBC: 6.6 Thousand/uL (ref 3.8–10.8)

## 2024-04-11 LAB — LIPID PANEL
Cholesterol: 174 mg/dL (ref <200)
HDL: 68 mg/dL (ref >=40)
LDL Cholesterol (Calc): 89 mg/dL
Non-HDL Cholesterol (Calc): 106 mg/dL (ref <130)
Total CHOL/HDL Ratio: 2.6 (calc) (ref <5.0)
Triglycerides: 76 mg/dL (ref <150)

## 2024-04-11 LAB — TSH: TSH: 2.02 m[IU]/L (ref 0.40–4.50)

## 2024-04-11 NOTE — Telephone Encounter (Signed)
 Patient has been notified of results and voiced understanding. Also copy has been mailed.

## 2024-05-30 ENCOUNTER — Ambulatory Visit: Admitting: Nurse Practitioner

## 2024-05-31 NOTE — Progress Notes (Unsigned)
 This encounter was created in error - please disregard.

## 2024-09-25 ENCOUNTER — Encounter: Admitting: Internal Medicine
